# Patient Record
Sex: Male | Born: 1980
Health system: Southern US, Community
[De-identification: ages and names within clinical notes are randomized; demographics above are authoritative.]

## PROBLEM LIST (undated history)

## (undated) DIAGNOSIS — E349 Endocrine disorder, unspecified: Secondary | ICD-10-CM

## (undated) DIAGNOSIS — F32A Depression, unspecified: Secondary | ICD-10-CM

## (undated) DIAGNOSIS — R112 Nausea with vomiting, unspecified: Secondary | ICD-10-CM

## (undated) DIAGNOSIS — Z5189 Encounter for other specified aftercare: Secondary | ICD-10-CM

## (undated) DIAGNOSIS — Z87898 Personal history of other specified conditions: Secondary | ICD-10-CM

## (undated) DIAGNOSIS — G8929 Other chronic pain: Secondary | ICD-10-CM

## (undated) DIAGNOSIS — D649 Anemia, unspecified: Secondary | ICD-10-CM

## (undated) DIAGNOSIS — F329 Major depressive disorder, single episode, unspecified: Secondary | ICD-10-CM

## (undated) DIAGNOSIS — E039 Hypothyroidism, unspecified: Secondary | ICD-10-CM

## (undated) DIAGNOSIS — E785 Hyperlipidemia, unspecified: Secondary | ICD-10-CM

## (undated) DIAGNOSIS — N62 Hypertrophy of breast: Secondary | ICD-10-CM

## (undated) DIAGNOSIS — R269 Unspecified abnormalities of gait and mobility: Secondary | ICD-10-CM

## (undated) DIAGNOSIS — M549 Dorsalgia, unspecified: Secondary | ICD-10-CM

## (undated) DIAGNOSIS — H539 Unspecified visual disturbance: Secondary | ICD-10-CM

## (undated) DIAGNOSIS — F419 Anxiety disorder, unspecified: Secondary | ICD-10-CM

## (undated) DIAGNOSIS — G35 Multiple sclerosis: Secondary | ICD-10-CM

## (undated) DIAGNOSIS — I82409 Acute embolism and thrombosis of unspecified deep veins of unspecified lower extremity: Secondary | ICD-10-CM

## (undated) DIAGNOSIS — R0683 Snoring: Secondary | ICD-10-CM

## (undated) DIAGNOSIS — Z9889 Other specified postprocedural states: Secondary | ICD-10-CM

## (undated) HISTORY — DX: Dorsalgia, unspecified: M54.9

## (undated) HISTORY — DX: Hyperlipidemia, unspecified: E78.5

## (undated) HISTORY — DX: Unspecified abnormalities of gait and mobility: R26.9

## (undated) HISTORY — PX: ULNAR NERVE REPAIR: SHX2594

## (undated) HISTORY — DX: Personal history of other specified conditions: Z87.898

## (undated) HISTORY — PX: BUNIONECTOMY: SHX129

## (undated) HISTORY — DX: Acute embolism and thrombosis of unspecified deep veins of unspecified lower extremity: I82.409

## (undated) HISTORY — DX: Anemia, unspecified: D64.9

## (undated) HISTORY — DX: Depression, unspecified: F32.A

## (undated) HISTORY — PX: GYNECOMASTIA EXCISION: SHX5170

## (undated) HISTORY — DX: Encounter for other specified aftercare: Z51.89

## (undated) HISTORY — DX: Endocrine disorder, unspecified: E34.9

## (undated) HISTORY — DX: Major depressive disorder, single episode, unspecified: F32.9

## (undated) HISTORY — DX: Unspecified visual disturbance: H53.9

## (undated) HISTORY — DX: Hypothyroidism, unspecified: E03.9

## (undated) HISTORY — DX: Other chronic pain: G89.29

## (undated) HISTORY — DX: Multiple sclerosis: G35

---

## 1999-06-12 ENCOUNTER — Emergency Department (HOSPITAL_COMMUNITY): Admission: EM | Admit: 1999-06-12 | Discharge: 1999-06-13 | Payer: Self-pay | Admitting: *Deleted

## 1999-08-08 ENCOUNTER — Emergency Department (HOSPITAL_COMMUNITY): Admission: EM | Admit: 1999-08-08 | Discharge: 1999-08-08 | Payer: Self-pay

## 1999-08-08 ENCOUNTER — Emergency Department (HOSPITAL_COMMUNITY): Admission: EM | Admit: 1999-08-08 | Discharge: 1999-08-08 | Payer: Self-pay | Admitting: Emergency Medicine

## 2001-12-29 ENCOUNTER — Emergency Department (HOSPITAL_COMMUNITY): Admission: EM | Admit: 2001-12-29 | Discharge: 2001-12-29 | Payer: Self-pay | Admitting: Emergency Medicine

## 2007-03-21 ENCOUNTER — Encounter: Admission: RE | Admit: 2007-03-21 | Discharge: 2007-03-21 | Payer: Self-pay | Admitting: General Surgery

## 2013-01-02 ENCOUNTER — Other Ambulatory Visit: Payer: Self-pay | Admitting: Neurology

## 2013-02-21 ENCOUNTER — Other Ambulatory Visit: Payer: Self-pay

## 2013-02-21 MED ORDER — INTERFERON BETA-1A 44 MCG/0.5ML ~~LOC~~ SOLN: 44.0000 ug | SUBCUTANEOUS | Status: DC

## 2013-03-08 ENCOUNTER — Encounter: Payer: Self-pay | Admitting: Nurse Practitioner

## 2013-03-10 ENCOUNTER — Encounter: Payer: Self-pay | Admitting: Nurse Practitioner

## 2013-03-10 ENCOUNTER — Ambulatory Visit (INDEPENDENT_AMBULATORY_CARE_PROVIDER_SITE_OTHER): Payer: BC Managed Care – PPO | Admitting: Nurse Practitioner

## 2013-03-10 VITALS — BP 120/68 | HR 68 | Ht 65.5 in | Wt 164.0 lb

## 2013-03-10 DIAGNOSIS — R269 Unspecified abnormalities of gait and mobility: Secondary | ICD-10-CM | POA: Insufficient documentation

## 2013-03-10 DIAGNOSIS — G35 Multiple sclerosis: Secondary | ICD-10-CM | POA: Insufficient documentation

## 2013-03-10 NOTE — Patient Instructions (Addendum)
Repeat MRI of the brain and cervical spine and compared to 2011 Rx for tecfidera Stop Rebif Followup in 3 months

## 2013-03-10 NOTE — Progress Notes (Signed)
Reason for visit follow up for MS  HPI:Mr. Jeremy [name], 32 year old returns for follow up. He was last seen 12/05/11. He has  a history of multiple sclerosis with dx in 2011. . This patient has also had a positive lupus anticoagulant antibody on one occasion.  The fatigue and listlessness that he was experiencing at one time is made better with testosterome.  . Patient is on Rebif, and is tolerating the medication well but is interested in oral drugs. Patient has had blood work drawn by  Dr. Su Hilt. Patient has noted no new symptoms of numbness or weakness of the face, arms, or legs. Patient denies any balance issues or problems controlling the bowels or the bladder. e male with a history of multiple sclerosis. This patient has also had a positive lupus anticoagulant antibody on one occasion.  Since last seen, the patient was started on testosterone supplementation which has made him feel much better. The fatigue and listlessness that he was experiencing has disappeared. Patient is on Rebif, and is tolerating the medication well.  ROS: Allergies, memory loss, depression, sleepiness   Medications Current Outpatient Prescriptions on File Prior to Visit  Medication Sig Dispense Refill  . ALPRAZolam (XANAX) 1 MG tablet Take 1 mg by mouth at bedtime as needed for sleep (take 2 tabs as needed at night).      Marland Kitchen amphetamine-dextroamphetamine (ADDERALL) 20 MG tablet Take 20 mg by mouth daily.      Marland Kitchen HYDROcodone-acetaminophen (VICODIN ES) 7.5-750 MG per tablet Take 1 tablet by mouth every 6 (six) hours as needed for pain.      Marland Kitchen interferon beta-1a (REBIF) 44 MCG/0.5ML injection Inject 0.5 mLs (44 mcg total) into the skin 3 (three) times a week.  18 mL  0  . levothyroxine (SYNTHROID) 25 MCG tablet Take 25 mcg by mouth daily before breakfast.      . simvastatin (ZOCOR) 10 MG tablet Take 10 mg by mouth at bedtime.      Marland Kitchen testosterone cypionate (DEPO-TESTOSTERONE) 200 MG/ML injection Inject 200 mg into the muscle every  14 (fourteen) days.       No current facility-administered medications on file prior to visit.    Allergies No Known Allergies  Physical Exam General: well developed, well nourished, seated, in no evident distress Head: head normocephalic and atraumatic. Oropharynx benign Neck: supple with no carotid  bruits Cardiovascular: regular rate and rhythm, no murmurs  Neurologic Exam Mental Status: Awake and fully alert. Oriented to place and time. Follows all commands. Speech and language normal.   Cranial Nerves:  Pupils equal, briskly reactive to light. Extraocular movements full without nystagmus. Visual fields full to confrontation. Hearing intact and symmetric to finger snap. Facial sensation intact. Face, tongue, palate move normally and symmetrically. Neck flexion and extension normal.  Motor: Normal bulk and tone. Normal strength in all tested extremity muscles.No focal weakness Sensory.: intact to touch and pinprick and vibratory.  Coordination: Rapid alternating movements normal in all extremities. Finger-to-nose and heel-to-shin performed accurately bilaterally. Gait and Station: Arises from chair without difficulty. Stance is normal. Gait demonstrates normal stride length and balance . Able to heel, toe and tandem walk without difficulty.  Reflexes: 2+ and symmetric. Toes downgoing.     ASSESSMENT: Multiple sclerosis on Rebif since 2011, wanting to switch to oral.  History of elevated liver enzymes in the past. MRI of the brain May 2011 shows solitary left anterior medial temporal white matter hyperintensity. Repeat scan was not done MRI of the  cervical spine in May 2011 shows spinal cord hyperintensity at C4 and C6 suspicious for demyelinating plaques. The lesion at C6 shows faint enhancement.   PLAN: Repeat MRI of the brain and cervical spine and compare to 2011( patient says he has met his deductible for the year) Will send recent labs to my attention Will set up for  tecfidera Followup in 3 months Nilda Riggs, [facility] APRN

## 2013-03-10 NOTE — Progress Notes (Signed)
I have read the note, and I agree with the clinical assessment and plan.  Shannan Garfinkel KEITH   

## 2013-03-11 DIAGNOSIS — Z0289 Encounter for other administrative examinations: Secondary | ICD-10-CM

## 2013-03-12 ENCOUNTER — Telehealth: Payer: Self-pay | Admitting: Neurology

## 2013-03-12 NOTE — Telephone Encounter (Signed)
Patient was here for a visit with Jeremy [name] on 03-10-13.  He is requesting a letter stating that he needs a retractable awning for the sun because of his MS disability.  He has already paid for letter.

## 2013-03-13 DIAGNOSIS — Z0289 Encounter for other administrative examinations: Secondary | ICD-10-CM

## 2013-03-14 ENCOUNTER — Other Ambulatory Visit: Payer: Self-pay | Admitting: Neurology

## 2013-03-14 ENCOUNTER — Encounter: Payer: Self-pay | Admitting: Nurse Practitioner

## 2013-03-14 NOTE — Telephone Encounter (Signed)
Letter was completed, I spoke with patient and he asked for it to be mailed.  Letter put in mail Friday.

## 2013-03-19 ENCOUNTER — Telehealth: Payer: Self-pay | Admitting: Nurse Practitioner

## 2013-03-19 NOTE — Telephone Encounter (Signed)
By viewing chart notes, patient is being changed to Tecfidera.  I called the patient back.  He said he has decided he does not want to change meds and wants to stay on Rebif.  Says he still has a few weeks of meds left, but will need a refill soon.  Told him I will get a message to [facility] regarding drug change back to Rebif.  He verbalized understanding.

## 2013-03-19 NOTE — Telephone Encounter (Signed)
Ok to switch back. He wanted to change because he is tired of shots was my understanding.

## 2013-03-20 MED ORDER — INTERFERON BETA-1A 44 MCG/0.5ML ~~LOC~~ SOLN: 44.0000 ug | SUBCUTANEOUS | Status: DC

## 2013-03-20 NOTE — Telephone Encounter (Signed)
Rx added back to med list and sent to pharmacy.

## 2013-03-26 ENCOUNTER — Ambulatory Visit (INDEPENDENT_AMBULATORY_CARE_PROVIDER_SITE_OTHER): Payer: BC Managed Care – PPO

## 2013-03-26 DIAGNOSIS — G35 Multiple sclerosis: Secondary | ICD-10-CM

## 2013-03-28 MED ORDER — GADOPENTETATE DIMEGLUMINE 469.01 MG/ML IV SOLN: 15.0000 mL | Freq: Once | INTRAVENOUS | Status: AC | PRN

## 2013-04-03 ENCOUNTER — Telehealth: Payer: Self-pay | Admitting: Nurse Practitioner

## 2013-04-03 NOTE — Telephone Encounter (Signed)
Called patient to discuss MRI I need him to call back with a time and # that he can be reached.

## 2013-04-03 NOTE — Telephone Encounter (Signed)
Called patient. Made him aware Dr. Anne Hahn and I reviewed MRI of the brain and c spine yesterday. We recommend change in therapy to Gilenya. He wants to call his insurance for coverage first. Made aware of steps that need to be taken prior to taking med. He will call back tomorrow

## 2013-04-04 ENCOUNTER — Telehealth: Payer: Self-pay | Admitting: Nurse Practitioner

## 2013-04-04 NOTE — Telephone Encounter (Signed)
I called patient. The patient is to have blood work done through his primary care physician. I have indicated that he needs a CBC, comprehensive metabolic profile, and a herpes zoster antibody level. He will be an ophthalmologic evaluation to ensure that the macula is healthy. After this, he will be referred for the cardiac evaluation. I left a message.

## 2013-04-04 NOTE — Telephone Encounter (Signed)
Patient came in today to sign paperwork to get Gilenya started.  I went through the checklist with him about testing that needs to be done.  He said that he is seeing his PCP, Dr. Burton Apley next week so he will be able to get the blood work done, I'm not sure if we need to have the lab orders set to him.  Also need referral to eye doctor and Dr. Jacinto Halim for consult and EGG.  The patient is aware of the process and will call with further questions.

## 2013-04-09 ENCOUNTER — Other Ambulatory Visit: Payer: Self-pay | Admitting: Neurology

## 2013-04-09 ENCOUNTER — Telehealth: Payer: Self-pay | Admitting: Neurology

## 2013-04-09 DIAGNOSIS — G35 Multiple sclerosis: Secondary | ICD-10-CM

## 2013-04-09 NOTE — Telephone Encounter (Signed)
Spoke to patient and he is scheduled for his blood work on 02-11-13, EKG and consult on 05-13-13, and he made his own eye exam on 04-22-13.  If Dr. Dione Booze can get him in sooner he will see him instead.

## 2013-04-24 ENCOUNTER — Telehealth: Payer: Self-pay

## 2013-04-24 NOTE — Telephone Encounter (Signed)
[employer] mailed [place] a correspondence indicating they have denied our request for Prior Approval on Gilenya.  The patient's policy states that he must try and fail a minimum of two preferred drugs which include: Betaseron, Copaxone Rebif and Tecfidera.   He amy be changed to a formulary alternative or a letter of appeal may be written and sent to the medical review board (Fax [phone]) to try and overturn denial.  Please advise.  Thank you.

## 2013-04-24 NOTE — Telephone Encounter (Signed)
Insurance company has denied Gilenya, but they will allow Tecfidera. The patient has had progression on Rebif. If the insurance will allow Tecfidera, I am okay with this. I'll have Eber Jones contact the patient.

## 2013-04-25 ENCOUNTER — Telehealth: Payer: Self-pay | Admitting: Neurology

## 2013-04-25 NOTE — Telephone Encounter (Signed)
The patient will need to complete the enrollment form.  I called him back.  Got no answer.  Left message asking that he call back and advise wether he wants to come in and sign forms, or if he would like them mailed.

## 2013-04-25 NOTE — Telephone Encounter (Signed)
Called patient and left a message that Dr. Anne Hahn and I are ok with Tecfidera. His insurance will not cover Gilenya. He needs to call back and let us know what he wants to do

## 2013-04-28 NOTE — Telephone Encounter (Signed)
Patient called requesting forms be mailed.  Elease Hashimoto has mailed this to the patient.

## 2013-04-29 ENCOUNTER — Encounter: Payer: Self-pay | Admitting: Neurology

## 2013-05-06 ENCOUNTER — Telehealth: Payer: Self-pay | Admitting: Neurology

## 2013-05-06 NOTE — Telephone Encounter (Signed)
Patient left message to find out status of Tecfidera.  I left message that forms have been faxed and that they will contact him once they receive approval from insurance co.

## 2013-05-13 ENCOUNTER — Telehealth: Payer: Self-pay | Admitting: Neurology

## 2013-05-13 NOTE — Telephone Encounter (Signed)
2 week washout

## 2013-05-13 NOTE — Telephone Encounter (Signed)
Patient wanted to know wash out period before he starts the Tecfidera.     I also relayed that his PA had been completed and he should wait for a call from Tecfidera, about copays and shipment of drug.

## 2013-05-14 NOTE — Telephone Encounter (Signed)
Left message that there is a two wash out period before he starts Tecfidera.

## 2013-05-20 ENCOUNTER — Other Ambulatory Visit: Payer: Self-pay | Admitting: Nurse Practitioner

## 2013-05-20 MED ORDER — INTERFERON BETA-1A 44 MCG/0.5ML ~~LOC~~ SOLN: 44.0000 ug | SUBCUTANEOUS | Status: DC

## 2013-06-10 ENCOUNTER — Ambulatory Visit (INDEPENDENT_AMBULATORY_CARE_PROVIDER_SITE_OTHER): Payer: BC Managed Care – PPO | Admitting: Nurse Practitioner

## 2013-06-10 ENCOUNTER — Encounter (INDEPENDENT_AMBULATORY_CARE_PROVIDER_SITE_OTHER): Payer: Self-pay

## 2013-06-10 ENCOUNTER — Encounter: Payer: Self-pay | Admitting: Nurse Practitioner

## 2013-06-10 VITALS — Ht 66.0 in | Wt 165.0 lb

## 2013-06-10 DIAGNOSIS — R269 Unspecified abnormalities of gait and mobility: Secondary | ICD-10-CM

## 2013-06-10 DIAGNOSIS — G35 Multiple sclerosis: Secondary | ICD-10-CM

## 2013-06-10 NOTE — Patient Instructions (Signed)
Liver function testing in 3 months Continue tecfidera 240 twice daily Followup in 4-6 months

## 2013-06-10 NOTE — Progress Notes (Signed)
I have read the note, and I agree with the clinical assessment and plan.  WILLIS,CHARLES KEITH   

## 2013-06-10 NOTE — Progress Notes (Signed)
[facility]  PATIENT: Jeremy [name] DOB: 07-19-1981   REASON FOR VISIT: For multiple sclerosis   HISTORY OF PRESENT ILLNESS:[name], 32 year old white male returns for followup. He was last in the office 03/10/2013 he was on Rebif at that time but was interested in oral medications as he is tired of injecting. He had repeat MRI of the brain and cervical spine prior to initiation of new medication.Abnormal MRI brain 03/10/13 (with and without) demonstrating: There is a faint left temporal, T2 hyperintense periventricular lesion (series 9 image 17, series 4 image 11). There are 5-6 additional subcortical T2 hyperintensities on sagittal T2FLAIR views, but not clearly seen on axial views. No abnormal lesions are seen on post contrast views. Consistent with chronic demyelinating plaques. Abnormal MRI cervical spine (with and without) demonstrating:  Faint chronic demyelinating plaques at C4 on the right, C6 on the left.  No acute plaques seen. His fatigue and listlessness was better with IM  testosterone instead of the gel he is currently taking prescribed by his primary care Dr. Su Hilt. He complains of depression and anxiety and is taking Xanax which helps him sleep. He complains of occasional joint pain and aching muscles. He denies any balance issues, spasticity, problem with bowel or bladder control. Dr. Anne Hahn had wanted to place the patient on Gilenya due to his MRI however his insurance would not pay for this after he had testing. They would cover tecfidera and he has been on that for approximately 2 weeks with mild complaints of flushing. He was instructed to take the medication with a full meal twice daily   HISTORY: He has a history of multiple sclerosis with dx in 2011. . This patient has also had a positive lupus anticoagulant antibody on one occasion. The fatigue and listlessness that he was experiencing at one time is made better with testosterome. . Patient is on Rebif,  and is tolerating the medication well but is interested in oral drugs. Patient has had blood work drawn by Dr. Su Hilt. Patient has noted no new symptoms of numbness or weakness of the face, arms, or legs. Patient denies any balance issues or problems controlling the bowels or the bladder. e male with a history of multiple sclerosis. This patient has also had a positive lupus anticoagulant antibody on one occasion. Since last seen, the patient was started on testosterone supplementation which has made him feel much better. The fatigue and listlessness that he was experiencing has disappeared. Patient is on Rebif, and is tolerating the medication well.     REVIEW OF SYSTEMS: Full 14 system review of systems performed and notable only for:  Constitutional: Fatigue  Cardiovascular: N/A  Ear/Nose/Throat: N/A  Skin: N/A  Eyes: N/A  Respiratory: Snoring Gastroitestinal: N/A  Hematology/Lymphatic: N/A  Endocrine: Flushing  Musculoskeletal joint pain Allergy/Immunology: N/A  Neurological: Memory loss  Psychiatric: Depression anxiety  ALLERGIES: No Known Allergies  HOME MEDICATIONS: Outpatient Prescriptions Prior to Visit  Medication Sig Dispense Refill  . ALPRAZolam (XANAX) 1 MG tablet Take 1 mg by mouth at bedtime as needed for sleep (take 2 tabs as needed at night).      Marland Kitchen amphetamine-dextroamphetamine (ADDERALL) 20 MG tablet Take 20 mg by mouth daily.      Marland Kitchen HYDROcodone-acetaminophen (VICODIN ES) 7.5-750 MG per tablet Take 1 tablet by mouth every 6 (six) hours as needed for pain.      Marland Kitchen levothyroxine (SYNTHROID) 25 MCG tablet Take 25 mcg by mouth daily before breakfast.      .  simvastatin (ZOCOR) 10 MG tablet Take 10 mg by mouth at bedtime.      . interferon beta-1a (REBIF) 44 MCG/0.5ML injection Inject 0.5 mLs (44 mcg total) into the skin 3 (three) times a week.  18 mL  1  . testosterone cypionate (DEPO-TESTOSTERONE) 200 MG/ML injection Inject 200 mg into the muscle every 14 (fourteen)  days.       No facility-administered medications prior to visit.    PAST MEDICAL HISTORY: Past Medical History  Diagnosis Date  . Hx of dizziness   . Gait disturbance   . Hypothyroidism   . Depression   . Dyslipidemia   . Back pain, chronic     low  . MS (multiple sclerosis)     probable  . Testosterone deficiency     PAST SURGICAL HISTORY: Past Surgical History  Procedure Laterality Date  . None listed      FAMILY HISTORY: Family History  Problem Relation Age of Onset  . Cancer Mother   . Cancer Father   . Stroke      Grandmother  . Hypertension Brother     SOCIAL HISTORY: History   Social History  . Marital Status: Single    Spouse Name: N/A    Number of Children: 0  . Years of Education: College    Occupational History  . Chef     Social History Main Topics  . Smoking status: Never Smoker   . Smokeless tobacco: Never Used  . Alcohol Use: No  . Drug Use: No  . Sexual Activity: Not on file   Other Topics Concern  . 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    Patient has a 4 year college education.    Patient has no children.            PHYSICAL EXAM  Filed Vitals:   06/10/13 1004  Height: 5\' 6"  (1.676 m)  Weight: 165 lb (74.844 kg)   Body mass index is 26.64 kg/(m^2).  Generalized: Well developed, in no acute distress  Head: normocephalic and atraumatic,. Oropharynx benign  Neck: Supple, no carotid bruits  Cardiac: Regular rate rhythm, no murmur  Musculoskeletal: No deformity   Neurological examination   Mentation: Alert oriented to time, place, history taking. Follows all commands speech and language fluent  Cranial nerve II-XII: Visual acuity 20/70 bilateral with glasses .Pupils were equal round reactive to light extraocular movements were full, visual field were full on confrontational test. Facial sensation and strength were normal. hearing was intact to finger rubbing bilaterally. Uvula  tongue midline. head turning and shoulder shrug and were normal and symmetric.Tongue protrusion into cheek strength was normal. Motor: normal bulk and tone, full strength in the BUE, BLE, fine finger movements normal, no pronator drift. No focal weakness Sensory: normal and symmetric to light touch, pinprick, and  vibration  Coordination: finger-nose-finger, heel-to-shin bilaterally, no dysmetria Reflexes: Brachioradialis 2/2, biceps 2/2, triceps 2/2, patellar 2/2, Achilles 2/2, plantar responses were flexor bilaterally. Gait and Station: Rising up from seated position without assistance, normal stance,  moderate stride, good arm swing, smooth turning, able to perform tiptoe, and heel walking without difficulty. Tandem gait steady  DIAGNOSTIC DATA (LABS, IMAGING, TESTING) - I reviewed patient records, labs, notes, testing and imaging myself where available.  Abnormal MRI brain 03/10/13 (with and without) demonstrating: There is a faint left temporal, T2 hyperintense periventricular lesion (series 9 image 17, series 4 image 11). There  are 5-6 additional subcortical T2 hyperintensities on sagittal T2FLAIR views, but not clearly seen on axial views. No abnormal lesions are seen on post contrast views. Consistent with chronic demyelinating plaques. Abnormal MRI cervical spine (with and without) demonstrating:  Faint chronic demyelinating plaques at C4 on the right, C6 on the left.  No acute plaques seen. ASSESSMENT AND PLAN  32 y.o. year old male  has a past medical history of dizziness; Gait disturbance; Hypothyroidism; Depression; Dyslipidemia; Back pain, chronic; MS (multiple sclerosis); and Testosterone deficiency. here to followup. We attempted to get the patient on gilenya  after his last visit,  his insurance company would not cover this. He is now on tecfidera and has been on that medication for about 2 weeks with minimal flushing  Liver function testing in 3 months, he will get this done at Dr.  Su Hilt office Continue tecfidera 240 twice daily Followup in 4-6 months Nilda Riggs, [facility], APRN  [facility] [address], [address] [place], [place] [address] [phone]

## 2013-07-07 ENCOUNTER — Telehealth: Payer: Self-pay

## 2013-07-07 NOTE — Telephone Encounter (Signed)
[employer] [place] sent [place] a letter saying they have approved our request for coverage on Tecfidera Effective from 06/03/2013 through 07/23/2038 Ref # [id number]

## 2013-07-31 ENCOUNTER — Other Ambulatory Visit: Payer: Self-pay

## 2013-07-31 ENCOUNTER — Telehealth: Payer: Self-pay | Admitting: Neurology

## 2013-07-31 MED ORDER — DIMETHYL FUMARATE 240 MG PO CPDR: 240.0000 mg | DELAYED_RELEASE_CAPSULE | Freq: Two times a day (BID) | ORAL | Status: DC

## 2013-07-31 NOTE — Telephone Encounter (Signed)
Patient called requesting a refill on Tecfidera.  He would like it sent to [facility].

## 2013-07-31 NOTE — Telephone Encounter (Signed)
Rx has already been sent.  Duplicate message.

## 2013-08-18 ENCOUNTER — Telehealth: Payer: Self-pay | Admitting: Nurse Practitioner

## 2013-08-18 NOTE — Telephone Encounter (Signed)
The prior auth was already approved effective until 2039 (see phone note from 12/15).  I called back.  Spoke with Rose.  She will not the patients file.

## 2013-08-18 NOTE — Telephone Encounter (Signed)
NEEDS PRE-AUTHORIZATION FOR TECFIDERA--[employer] [place] NEEDS ALL CLINICALS FAXED TO [phone]--REF#[id number] IF CALLING BACK [employer].

## 2013-08-19 ENCOUNTER — Telehealth: Payer: Self-pay | Admitting: Neurology

## 2013-08-19 NOTE — Telephone Encounter (Signed)
Jeremy [name] w/[employer] called to get a prior authorization for Tysabri for PT.  She stated that the case was opened yesterday (08-18-13) and they have 7 days time limit.  Selena Batten stated that she will be faxing the paperwork to [phone] and when received we should call and confirm whether we would like to keep the case open or if they should close it until we get the paperwork completed and physician's approval.  The case # is [id number].  Please call with any questions.

## 2013-08-19 NOTE — Telephone Encounter (Signed)
Last OV says the patient is on Tecfidera, not Tysabri.  We already have a PA approval on file for Tecfidera effective until 2039.  (See note from 12/15).  I called Kim back.  Got no answer.  [place].  Asked her to call me back if anything further is needed.  I think maybe the Tysabri is a mistake, as I am unable to locate anything in the chart notes regarding this drug.

## 2013-12-09 ENCOUNTER — Ambulatory Visit (INDEPENDENT_AMBULATORY_CARE_PROVIDER_SITE_OTHER): Payer: BC Managed Care – PPO | Admitting: Nurse Practitioner

## 2013-12-09 ENCOUNTER — Encounter: Payer: Self-pay | Admitting: Nurse Practitioner

## 2013-12-09 VITALS — BP 101/57 | HR 75 | Ht 66.0 in | Wt 163.0 lb

## 2013-12-09 DIAGNOSIS — G35 Multiple sclerosis: Secondary | ICD-10-CM

## 2013-12-09 NOTE — Patient Instructions (Signed)
Continue tecfidera at current dose, less side effects with a full meal Please send copy of recent labs to our fax number, given the patient Followup in 6 months at that time repeat MRI of the brain Healthy diet with fruits  and vegetables, fish and chicken Moderate exercise for overall general health Followup in 6 months

## 2013-12-09 NOTE — Progress Notes (Signed)
[facility]  PATIENT: Jeremy [name] DOB: 1980/12/05   REASON FOR VISIT: Followup for multiple sclerosis   HISTORY OF PRESENT ILLNESS:Jeremy [name], 33 year old male returns for followup. He was on Rebif at one time but is now on tecfidera with minimal side effects. His insurance would not cover gilenya. He had repeat MRI of the brain and cervical spine prior to initiation of new medication.Abnormal MRI brain 03/10/13 (with and without) demonstrating: There is a faint left temporal, T2 hyperintense periventricular lesion (series 9 image 17, series 4 image 11). There are 5-6 additional subcortical T2 hyperintensities on sagittal T2FLAIR views, but not clearly seen on axial views. No abnormal lesions are seen on post contrast views. Consistent with chronic demyelinating plaques. Abnormal MRI cervical spine (with and without) demonstrating: Faint chronic demyelinating plaques at C4 on the right, C6 on the left. No acute plaques seen. His fatigue and listlessness was better with IM testosterone instead of the gel he is currently taking prescribed by his primary care Dr. Su [name]. He complains of depression and anxiety and is taking Xanax which helps him sleep. He complains of occasional joint pain and aching muscles. He denies any balance issues, spasticity, problem with bowel or bladder control. He returns for reevaluation   HISTORY: He has a history of multiple sclerosis with dx in 2011.  This patient has also had a positive lupus anticoagulant antibody on one occasion. The fatigue and listlessness that he was experiencing at one time is made better with testosterome. . Patient is on Rebif, and is tolerating the medication well but is interested in oral drugs. Patient has had blood work drawn by Dr. Su [name]. Patient has noted no new symptoms of numbness or weakness of the face, arms, or legs. Patient denies any balance issues or problems controlling the bowels or the bladder. e male with a  history of multiple sclerosis. This patient has also had a positive lupus anticoagulant antibody on one occasion. Since last seen, the patient was started on testosterone supplementation which has made him feel much better. The fatigue and listlessness that he was experiencing has disappeared. Patient is on Rebif, and is tolerating the medication well.      REVIEW OF SYSTEMS: Full 14 system review of systems performed and notable only for those listed, all others are neg:  Constitutional: Fatigue  Cardiovascular: N/A  Ear/Nose/Throat: N/A  Skin: N/A  Eyes: N/A  Respiratory: N/A  Gastroitestinal: N/A  Hematology/Lymphatic: N/A  Endocrine: N/A Musculoskeletal:N/A  Allergy/Immunology: N/A  Neurological: N/A Psychiatric: Depression anxiety Sleep : Snoring, insomnia  ALLERGIES: No Known Allergies  HOME MEDICATIONS: Outpatient Prescriptions Prior to Visit  Medication Sig Dispense Refill  . ALPRAZolam (XANAX) 1 MG tablet Take 1 mg by mouth at bedtime as needed for sleep (take 2 tabs as needed at night).      Marland Kitchen amphetamine-dextroamphetamine (ADDERALL) 20 MG tablet Take 20 mg by mouth daily.      . Dimethyl Fumarate (TECFIDERA) 240 MG CPDR Take 1 capsule (240 mg total) by mouth 2 (two) times daily.  60 capsule  5  . HYDROcodone-acetaminophen (VICODIN ES) 7.5-750 MG per tablet Take 1 tablet by mouth every 6 (six) hours as needed for pain.      Marland Kitchen levothyroxine (SYNTHROID) 25 MCG tablet Take 25 mcg by mouth daily before breakfast.      . simvastatin (ZOCOR) 10 MG tablet Take 10 mg by mouth at bedtime.      Marland Kitchen testosterone (TESTIM) 50 MG/5GM GEL Place 5 g  onto the skin daily. 100 mg daily       No facility-administered medications prior to visit.    PAST MEDICAL HISTORY: Past Medical History  Diagnosis Date  . Hx of dizziness   . Gait disturbance   . Hypothyroidism   . Depression   . Dyslipidemia   . Back pain, chronic     low  . MS (multiple sclerosis)     probable  .  Testosterone deficiency     PAST SURGICAL HISTORY: Past Surgical History  Procedure Laterality Date  . None listed      FAMILY HISTORY: Family History  Problem Relation Age of Onset  . Cancer Mother   . Cancer Father   . Stroke      Grandmother  . Hypertension Brother     SOCIAL HISTORY: History   Social History  . Marital Status: Single    Spouse Name: N/A    Number of Children: 0  . Years of Education: College    Occupational History  . Chef     Social History Main Topics  . Smoking status: Never Smoker   . Smokeless tobacco: Never Used  . Alcohol Use: No  . Drug Use: No  . Sexual Activity: Not on file   Other Topics Concern  . Not on file   Social History Narrative   Patient is single and works as a Geologist, engineeringDJ.    Patient has a 4 year college education.    Patient has no children.            PHYSICAL EXAM  Filed Vitals:   12/09/13 1053  BP: 101/57  Pulse: 75  Height: 5\' 6"  (1.676 m)  Weight: 163 lb (73.936 kg)   Body mass index is 26.32 kg/(m^2).  Generalized: Well developed, in no acute distress  Head: normocephalic and atraumatic,. Oropharynx benign  Neck: Supple, no carotid bruits  Cardiac: Regular rate rhythm, no murmur  Musculoskeletal: No deformity   Neurological examination   Mentation: Alert oriented to time, place, history taking. Follows all commands speech and language fluent  Cranial nerve II-XII: Visual acuity 20/20 right, 20/30 left . Pupils were equal round reactive to light extraocular movements were full, visual field were full on confrontational test. Facial sensation and strength were normal. hearing was intact to finger rubbing bilaterally. Uvula tongue midline. head turning and shoulder shrug were normal and symmetric.Tongue protrusion into cheek strength was normal. Motor: normal bulk and tone, full strength in the BUE, BLE, fine finger movements normal, no pronator drift. No focal weakness Sensory: normal and symmetric to  light touch, pinprick, and  vibration  Coordination: finger-nose-finger, heel-to-shin bilaterally, no dysmetria Reflexes: Brachioradialis 2/2, biceps 2/2, triceps 2/2, patellar 2/2, Achilles 2/2, plantar responses were flexor bilaterally. Gait and Station: Rising up from seated position without assistance, normal stance,  moderate stride, good arm swing, smooth turning, able to perform tiptoe, and heel walking without difficulty. Tandem gait is steady  DIAGNOSTIC DATA (LABS, IMAGING, TESTING) -  ASSESSMENT AND PLAN  33 y.o. year old male  has a past medical history of dizziness; Gait disturbance; Hypothyroidism; Depression; Dyslipidemia; Back pain, chronic; MS (multiple sclerosis); and Testosterone deficiency. here to followup for his multiple sclerosis. He is currently on tecfidera with minimal side effects of flushing. He does not always take it with a full meal  Continue tecfidera at current dose, less side effects with a full meal Please send copy of recent labs to our fax number, given the patient Followup  in 6 months at that time repeat MRI of the brain Healthy diet with fruits  and vegetables, fish and chicken, given document on healthy eating  Moderate exercise for overall general health Followup in 6 months Nilda Riggs, [facility], [facility], APRN  [facility] [address], [address] [place], [place] [address] [phone]

## 2013-12-09 NOTE — Progress Notes (Signed)
I have read the note, and I agree with the clinical assessment and plan.  Gerard Cantara K Takyla Kuchera   

## 2014-01-19 ENCOUNTER — Encounter (INDEPENDENT_AMBULATORY_CARE_PROVIDER_SITE_OTHER): Payer: Self-pay | Admitting: General Surgery

## 2014-01-19 ENCOUNTER — Ambulatory Visit (INDEPENDENT_AMBULATORY_CARE_PROVIDER_SITE_OTHER): Payer: BC Managed Care – PPO | Admitting: General Surgery

## 2014-01-19 VITALS — BP 124/82 | HR 75 | Temp 97.5°F | Ht 66.0 in | Wt 158.0 lb

## 2014-01-19 DIAGNOSIS — N62 Hypertrophy of breast: Secondary | ICD-10-CM | POA: Insufficient documentation

## 2014-01-19 NOTE — Progress Notes (Signed)
Patient ID: Jeremy [name], male   DOB: 1980-10-16, 33 y.o.   MRN: [id number]  Chief Complaint  Patient presents with  . eval breast    HPI Jeremy [name] is a 33 y.o. male.  We're asked to see the patient in consultation by Dr. Burton Apley to evaluate him for bilateral gynecomastia. The patient is a 33 year old white male who we saw about 6 years ago with painful gynecomastia. Since that time he has undergone 2 liposuction procedures to try to remove the painful breast tissue. Unfortunately these operations have been unsuccessful. He continues to complain of painful swelling beneath the nipple and the areola complex on both breasts which is worse on the left. He denies any discharge from his nipple on either side. HPI  Past Medical History  Diagnosis Date  . Hx of dizziness   . Gait disturbance   . Hypothyroidism   . Depression   . Dyslipidemia   . Back pain, chronic     low  . MS (multiple sclerosis)     probable  . Testosterone deficiency   . Blood transfusion without reported diagnosis     Past Surgical History  Procedure Laterality Date  . None listed      Family History  Problem Relation Age of Onset  . Cancer Mother   . Cancer Father   . Stroke      Grandmother  . Hypertension Brother     Social History History  Substance Use Topics  . Smoking status: Never Smoker   . Smokeless tobacco: Never Used  . Alcohol Use: No    No Known Allergies  Current Outpatient Prescriptions  Medication Sig Dispense Refill  . ALPRAZolam (XANAX) 1 MG tablet Take 1 mg by mouth at bedtime as needed for sleep (take 2 tabs as needed at night).      Marland Kitchen amphetamine-dextroamphetamine (ADDERALL) 20 MG tablet Take 20 mg by mouth daily.      . Dimethyl Fumarate (TECFIDERA) 240 MG CPDR Take 1 capsule (240 mg total) by mouth 2 (two) times daily.  60 capsule  5  . fluticasone (FLONASE) 50 MCG/ACT nasal spray as needed.      Marland Kitchen HYDROcodone-acetaminophen (VICODIN ES) 7.5-750 MG per tablet Take 1  tablet by mouth every 6 (six) hours as needed for pain.      Marland Kitchen ibuprofen (ADVIL,MOTRIN) 800 MG tablet       . ipratropium (ATROVENT) 0.03 % nasal spray       . levothyroxine (SYNTHROID) 25 MCG tablet Take 25 mcg by mouth daily before breakfast.      . QNASL 80 MCG/ACT AERS       . simvastatin (ZOCOR) 10 MG tablet Take 10 mg by mouth at bedtime.       No current facility-administered medications for this visit.    Review of Systems Review of Systems  Constitutional: Negative.   HENT: Negative.   Eyes: Negative.   Respiratory: Negative.   Cardiovascular: Negative.   Gastrointestinal: Negative.   Endocrine: Negative.   Genitourinary: Negative.   Musculoskeletal: Negative.   Skin: Negative.   Allergic/Immunologic: Negative.   Neurological: Negative.   Hematological: Negative.   Psychiatric/Behavioral: Negative.     Blood pressure 124/82, pulse 75, temperature 97.5 F (36.4 C), height 5\' 6"  (1.676 m), weight 158 lb (71.668 kg).  Physical Exam Physical Exam  Constitutional: He is oriented to person, place, and time. He appears well-developed and well-nourished.  HENT:  Head: Normocephalic and atraumatic.  Eyes: Conjunctivae and  EOM are normal. Pupils are equal, round, and reactive to light.  Neck: Normal range of motion. Neck supple.  Cardiovascular: Normal rate, regular rhythm and normal heart sounds.   Pulmonary/Chest: Effort normal and breath sounds normal.  In the right breast there is a painful mass approximately 4 cm in diameter beneath the nipple and areola. In the left breast there is a painful mass approximately 6 cm in diameter beneath the nipple and areola. There is no palpable axillary, supraclavicular, or cervical lymphadenopathy  Abdominal: Soft. Bowel sounds are normal.  Musculoskeletal: Normal range of motion.  Neurological: He is alert and oriented to person, place, and time.  Skin: Skin is warm and dry.  Psychiatric: He has a normal mood and affect. His  behavior is normal.    Data Reviewed As above  Assessment    The patient has bilateral painful gynecomastia which is worse on the left. Because of his discomfort and the fact that these areas have recurred I think it would be reasonable to excise them completely. I discussed with him in detail the risks and benefits of the operation to do this as well as some of the technical aspects and he understands and wishes to proceed     Plan    Plan for bilateral excision of gynecomastia        TOTH III,PAUL S 01/19/2014, 3:55 PM

## 2014-01-22 ENCOUNTER — Other Ambulatory Visit: Payer: Self-pay | Admitting: Neurology

## 2014-02-23 ENCOUNTER — Encounter (HOSPITAL_COMMUNITY): Payer: Self-pay | Admitting: Pharmacy Technician

## 2014-02-28 NOTE — Pre-Procedure Instructions (Signed)
Jeremy [name]  02/28/2014   Your procedure is scheduled on:  August 17  Report to [facility] Admitting at 05:30 AM.  Call this number if you have problems the morning of surgery: [phone]   Remember:   Do not eat food or drink liquids after midnight.   Take these medicines the morning of surgery with A SIP OF WATER: Adderall, Tecfidera (only if you can take without food), Hydrocodone (if needed), Levothyroxine,    STOP Multiple Vitamins, Ibuprofen today   Do not wear jewelry, make-up or nail polish.  Do not wear lotions, powders, or perfumes. You may wear deodorant.  Do not shave 48 hours prior to surgery. Men may shave face and neck.  Do not bring valuables to the hospital.  [facility] is not responsible for any belongings or valuables.               Contacts, dentures or bridgework may not be worn into surgery.  Leave suitcase in the car. After surgery it may be brought to your room.  For patients admitted to the hospital, discharge time is determined by your treatment team.               Patients discharged the day of surgery will not be allowed to drive home.  Name and phone number of your driver: Family/ Friend  Special Instructions: See [facility] Preparing For Surgery   Please read over the following fact sheets that you were given: Pain Booklet, Coughing and Deep Breathing and Surgical Site Infection Prevention

## 2014-03-02 ENCOUNTER — Encounter (HOSPITAL_COMMUNITY): Payer: Self-pay

## 2014-03-02 ENCOUNTER — Encounter (HOSPITAL_COMMUNITY)
Admission: RE | Admit: 2014-03-02 | Discharge: 2014-03-02 | Disposition: A | Discharge status: Home / Self Care | Payer: BC Managed Care – PPO | Source: Ambulatory Visit | Attending: General Surgery | Admitting: General Surgery

## 2014-03-02 DIAGNOSIS — Z01818 Encounter for other preprocedural examination: Secondary | ICD-10-CM | POA: Insufficient documentation

## 2014-03-02 DIAGNOSIS — Z01812 Encounter for preprocedural laboratory examination: Secondary | ICD-10-CM | POA: Insufficient documentation

## 2014-03-02 HISTORY — DX: Hypertrophy of breast: N62

## 2014-03-02 HISTORY — DX: Nausea with vomiting, unspecified: Z98.890

## 2014-03-02 HISTORY — DX: Other specified postprocedural states: R11.2

## 2014-03-02 HISTORY — DX: Snoring: R06.83

## 2014-03-02 HISTORY — DX: Anxiety disorder, unspecified: F41.9

## 2014-03-02 LAB — CBC
HCT: 43.2 % (ref 39.0–52.0)
Hemoglobin: 14.9 g/dL (ref 13.0–17.0)
MCH: 31.8 pg (ref 26.0–34.0)
MCHC: 34.5 g/dL (ref 30.0–36.0)
MCV: 92.3 fL (ref 78.0–100.0)
PLATELETS: 169 10*3/uL (ref 150–400)
RBC: 4.68 MIL/uL (ref 4.22–5.81)
RDW: 12.9 % (ref 11.5–15.5)
WBC: 5.4 10*3/uL (ref 4.0–10.5)

## 2014-03-02 LAB — BASIC METABOLIC PANEL
Anion gap: 12 (ref 5–15)
BUN: 15 mg/dL (ref 6–23)
CALCIUM: 9.6 mg/dL (ref 8.4–10.5)
CO2: 26 mEq/L (ref 19–32)
Chloride: 104 mEq/L (ref 96–112)
Creatinine, Ser: 0.86 mg/dL (ref 0.50–1.35)
GLUCOSE: 82 mg/dL (ref 70–99)
Potassium: 4.9 mEq/L (ref 3.7–5.3)
SODIUM: 142 meq/L (ref 137–147)

## 2014-03-02 NOTE — Pre-Procedure Instructions (Signed)
Jeremy [name]  03/02/2014   Your procedure is scheduled on:  Monday March 09, 2014 at 7:30 AM.  Report to [facility] Admitting at 5:30 AM.  Call this number if you have problems the morning of surgery: [phone]   Remember:   Do not eat food or drink liquids after midnight.   Take these medicines the morning of surgery with A SIP OF WATER: Tecfidera (only if you can take without food), Hydrocodone (if needed),    STOP Multiple Vitamins, Ibuprofen today   Do not wear jewelry.  Do not wear lotions, powders, or cologne.  Men may shave face and neck.  Do not bring valuables to the hospital.  [facility] is not responsible for any belongings or valuables.               Contacts, dentures or bridgework may not be worn into surgery.  Leave suitcase in the car. After surgery it may be brought to your room.  For patients admitted to the hospital, discharge time is determined by your treatment team.               Patients discharged the day of surgery will not be allowed to drive home.  Name and phone number of your driver: Family/ Friend  Special Instructions: Shower the night before and the morning of your surgery using CHG soap   Please read over the following fact sheets that you were given: Pain Booklet, Coughing and Deep Breathing and Surgical Site Infection Prevention

## 2014-03-02 NOTE — Progress Notes (Signed)
PCP is Burton Apleyonald Roberts. Patient denied having a stress test, or cardiac cath, but informed Nurse that he had a sleep study last year and he has mild sleep apnea. Patient stated "I use nasal spray now so it is ok."

## 2014-03-03 ENCOUNTER — Encounter (HOSPITAL_COMMUNITY): Payer: Self-pay

## 2014-03-08 MED ORDER — CEFAZOLIN SODIUM-DEXTROSE 2-3 GM-% IV SOLR
2.0000 g | INTRAVENOUS | Status: AC
  Administered 2014-03-09: 2 g via INTRAVENOUS
  Filled 2014-03-08: qty 50

## 2014-03-09 ENCOUNTER — Ambulatory Visit (HOSPITAL_COMMUNITY)
Admission: RE | Admit: 2014-03-09 | Discharge: 2014-03-09 | Disposition: A | Discharge status: Home / Self Care | Payer: BC Managed Care – PPO | Source: Ambulatory Visit | Attending: General Surgery | Admitting: General Surgery

## 2014-03-09 ENCOUNTER — Encounter (HOSPITAL_COMMUNITY): Payer: Self-pay | Admitting: *Deleted

## 2014-03-09 ENCOUNTER — Ambulatory Visit (HOSPITAL_COMMUNITY): Payer: BC Managed Care – PPO | Admitting: Certified Registered"

## 2014-03-09 ENCOUNTER — Telehealth (INDEPENDENT_AMBULATORY_CARE_PROVIDER_SITE_OTHER): Payer: Self-pay

## 2014-03-09 ENCOUNTER — Encounter (HOSPITAL_COMMUNITY): Payer: BC Managed Care – PPO | Admitting: Certified Registered"

## 2014-03-09 ENCOUNTER — Encounter (HOSPITAL_COMMUNITY)
Admission: RE | Disposition: A | Discharge status: Home / Self Care | Payer: Self-pay | Source: Ambulatory Visit | Attending: General Surgery

## 2014-03-09 DIAGNOSIS — M549 Dorsalgia, unspecified: Secondary | ICD-10-CM | POA: Insufficient documentation

## 2014-03-09 DIAGNOSIS — F329 Major depressive disorder, single episode, unspecified: Secondary | ICD-10-CM | POA: Diagnosis not present

## 2014-03-09 DIAGNOSIS — N62 Hypertrophy of breast: Secondary | ICD-10-CM

## 2014-03-09 DIAGNOSIS — G35 Multiple sclerosis: Secondary | ICD-10-CM | POA: Diagnosis not present

## 2014-03-09 DIAGNOSIS — E785 Hyperlipidemia, unspecified: Secondary | ICD-10-CM | POA: Insufficient documentation

## 2014-03-09 DIAGNOSIS — F3289 Other specified depressive episodes: Secondary | ICD-10-CM | POA: Diagnosis not present

## 2014-03-09 DIAGNOSIS — G8929 Other chronic pain: Secondary | ICD-10-CM | POA: Diagnosis not present

## 2014-03-09 DIAGNOSIS — E039 Hypothyroidism, unspecified: Secondary | ICD-10-CM | POA: Insufficient documentation

## 2014-03-09 HISTORY — PX: GYNECOMASTIA MASTECTOMY: SHX5265

## 2014-03-09 SURGERY — MASTECTOMY, FOR GYNECOMASTIA
Anesthesia: General | Site: Breast | Laterality: Bilateral

## 2014-03-09 MED ORDER — ROCURONIUM BROMIDE 100 MG/10ML IV SOLN
INTRAVENOUS | Status: DC | PRN
  Administered 2014-03-09: 30 mg via INTRAVENOUS

## 2014-03-09 MED ORDER — CHLORHEXIDINE GLUCONATE 4 % EX LIQD
1.0000 "application " | Freq: Once | CUTANEOUS | Status: DC
  Filled 2014-03-09: qty 15

## 2014-03-09 MED ORDER — FENTANYL CITRATE 0.05 MG/ML IJ SOLN
25.0000 ug | INTRAMUSCULAR | Status: DC | PRN
  Administered 2014-03-09 (×2): 50 ug via INTRAVENOUS

## 2014-03-09 MED ORDER — MIDAZOLAM HCL 5 MG/5ML IJ SOLN
INTRAMUSCULAR | Status: DC | PRN
  Administered 2014-03-09: 2 mg via INTRAVENOUS

## 2014-03-09 MED ORDER — GLYCOPYRROLATE 0.2 MG/ML IJ SOLN
INTRAMUSCULAR | Status: AC
  Filled 2014-03-09: qty 3

## 2014-03-09 MED ORDER — OXYCODONE-ACETAMINOPHEN 5-325 MG PO TABS: 1.0000 | ORAL_TABLET | ORAL | Status: DC | PRN

## 2014-03-09 MED ORDER — GLYCOPYRROLATE 0.2 MG/ML IJ SOLN
INTRAMUSCULAR | Status: DC | PRN
  Administered 2014-03-09: .6 mg via INTRAVENOUS

## 2014-03-09 MED ORDER — DIPHENHYDRAMINE HCL 50 MG/ML IJ SOLN
INTRAMUSCULAR | Status: DC | PRN
  Administered 2014-03-09: 6.25 mg via INTRAVENOUS

## 2014-03-09 MED ORDER — MORPHINE SULFATE 4 MG/ML IJ SOLN
4.0000 mg | INTRAMUSCULAR | Status: DC | PRN
  Administered 2014-03-09: 4 mg via INTRAVENOUS

## 2014-03-09 MED ORDER — FENTANYL CITRATE 0.05 MG/ML IJ SOLN
INTRAMUSCULAR | Status: AC
  Filled 2014-03-09: qty 5

## 2014-03-09 MED ORDER — ONDANSETRON HCL 4 MG/2ML IJ SOLN
INTRAMUSCULAR | Status: DC | PRN
  Administered 2014-03-09: 4 mg via INTRAVENOUS

## 2014-03-09 MED ORDER — PROMETHAZINE HCL 25 MG/ML IJ SOLN: 6.2500 mg | INTRAMUSCULAR | Status: DC | PRN

## 2014-03-09 MED ORDER — FENTANYL CITRATE 0.05 MG/ML IJ SOLN
INTRAMUSCULAR | Status: DC | PRN
  Administered 2014-03-09: 100 ug via INTRAVENOUS
  Administered 2014-03-09: 50 ug via INTRAVENOUS

## 2014-03-09 MED ORDER — SODIUM CHLORIDE 0.9 % IJ SOLN
INTRAMUSCULAR | Status: AC
  Filled 2014-03-09: qty 10

## 2014-03-09 MED ORDER — EPHEDRINE SULFATE 50 MG/ML IJ SOLN
INTRAMUSCULAR | Status: DC | PRN
  Administered 2014-03-09: 10 mg via INTRAVENOUS

## 2014-03-09 MED ORDER — DEXAMETHASONE SODIUM PHOSPHATE 4 MG/ML IJ SOLN
INTRAMUSCULAR | Status: AC
  Filled 2014-03-09: qty 2

## 2014-03-09 MED ORDER — 0.9 % SODIUM CHLORIDE (POUR BTL) OPTIME
TOPICAL | Status: DC | PRN
  Administered 2014-03-09: 1000 mL

## 2014-03-09 MED ORDER — PROPOFOL 10 MG/ML IV BOLUS
INTRAVENOUS | Status: AC
  Filled 2014-03-09: qty 20

## 2014-03-09 MED ORDER — LIDOCAINE HCL (CARDIAC) 20 MG/ML IV SOLN
INTRAVENOUS | Status: DC | PRN
  Administered 2014-03-09: 50 mg via INTRAVENOUS

## 2014-03-09 MED ORDER — OXYCODONE-ACETAMINOPHEN 5-325 MG PO TABS
1.0000 | ORAL_TABLET | ORAL | Status: DC | PRN
  Administered 2014-03-09: 2 via ORAL

## 2014-03-09 MED ORDER — NEOSTIGMINE METHYLSULFATE 10 MG/10ML IV SOLN
INTRAVENOUS | Status: DC | PRN
  Administered 2014-03-09: 3 mg via INTRAVENOUS

## 2014-03-09 MED ORDER — PROPOFOL 10 MG/ML IV BOLUS
INTRAVENOUS | Status: DC | PRN
  Administered 2014-03-09: 180 mg via INTRAVENOUS

## 2014-03-09 MED ORDER — EPHEDRINE SULFATE 50 MG/ML IJ SOLN
INTRAMUSCULAR | Status: AC
  Filled 2014-03-09: qty 1

## 2014-03-09 MED ORDER — DEXAMETHASONE SODIUM PHOSPHATE 10 MG/ML IJ SOLN
INTRAMUSCULAR | Status: DC | PRN
  Administered 2014-03-09: 8 mg via INTRAVENOUS

## 2014-03-09 MED ORDER — MEPERIDINE HCL 25 MG/ML IJ SOLN: 6.2500 mg | INTRAMUSCULAR | Status: DC | PRN

## 2014-03-09 MED ORDER — DEXMEDETOMIDINE HCL IN NACL 200 MCG/50ML IV SOLN
INTRAVENOUS | Status: DC | PRN
  Administered 2014-03-09: .1 ug/h via INTRAVENOUS

## 2014-03-09 MED ORDER — OXYCODONE-ACETAMINOPHEN 5-325 MG PO TABS
ORAL_TABLET | ORAL | Status: AC
  Administered 2014-03-09: 2 via ORAL
  Filled 2014-03-09: qty 2

## 2014-03-09 MED ORDER — ARTIFICIAL TEARS OP OINT
TOPICAL_OINTMENT | OPHTHALMIC | Status: DC | PRN
  Administered 2014-03-09: 1 via OPHTHALMIC

## 2014-03-09 MED ORDER — PROMETHAZINE HCL 25 MG/ML IJ SOLN
INTRAMUSCULAR | Status: AC
  Filled 2014-03-09: qty 1

## 2014-03-09 MED ORDER — MIDAZOLAM HCL 2 MG/2ML IJ SOLN
INTRAMUSCULAR | Status: AC
  Filled 2014-03-09: qty 2

## 2014-03-09 MED ORDER — FENTANYL CITRATE 0.05 MG/ML IJ SOLN
INTRAMUSCULAR | Status: AC
  Administered 2014-03-09: 50 ug via INTRAVENOUS
  Filled 2014-03-09: qty 2

## 2014-03-09 MED ORDER — LACTATED RINGERS IV SOLN
INTRAVENOUS | Status: DC | PRN
  Administered 2014-03-09 (×2): via INTRAVENOUS

## 2014-03-09 MED ORDER — NEOSTIGMINE METHYLSULFATE 10 MG/10ML IV SOLN
INTRAVENOUS | Status: AC
  Filled 2014-03-09: qty 1

## 2014-03-09 MED ORDER — MORPHINE SULFATE 4 MG/ML IJ SOLN
INTRAMUSCULAR | Status: DC
Start: 2014-03-09 — End: 2014-03-09
  Filled 2014-03-09: qty 1

## 2014-03-09 MED ORDER — ACETAMINOPHEN 10 MG/ML IV SOLN
INTRAVENOUS | Status: AC
  Administered 2014-03-09: 1000 mg via INTRAVENOUS
  Filled 2014-03-09: qty 100

## 2014-03-09 SURGICAL SUPPLY — 43 items
ADH SKN CLS APL DERMABOND .7 (GAUZE/BANDAGES/DRESSINGS) ×2
APPLIER CLIP 9.375 MED OPEN (MISCELLANEOUS)
APR CLP MED 9.3 20 MLT OPN (MISCELLANEOUS)
BINDER BREAST LRG (GAUZE/BANDAGES/DRESSINGS) IMPLANT
BINDER BREAST XLRG (GAUZE/BANDAGES/DRESSINGS) IMPLANT
BIOPATCH RED 1 DISK 7.0 (GAUZE/BANDAGES/DRESSINGS) ×2 IMPLANT
CANISTER SUCTION 2500CC (MISCELLANEOUS) ×2 IMPLANT
CHLORAPREP W/TINT 26ML (MISCELLANEOUS) ×2 IMPLANT
CLIP APPLIE 9.375 MED OPEN (MISCELLANEOUS) IMPLANT
CONT SPEC 4OZ CLIKSEAL STRL BL (MISCELLANEOUS) ×2 IMPLANT
COVER SURGICAL LIGHT HANDLE (MISCELLANEOUS) ×2 IMPLANT
DERMABOND ADVANCED (GAUZE/BANDAGES/DRESSINGS) ×2
DERMABOND ADVANCED .7 DNX12 (GAUZE/BANDAGES/DRESSINGS) ×1 IMPLANT
DRAIN CHANNEL 19F RND (DRAIN) ×3 IMPLANT
DRAPE CHEST BREAST 15X10 FENES (DRAPES) ×2 IMPLANT
DRAPE UTILITY 15X26 W/TAPE STR (DRAPE) ×4 IMPLANT
DRSG PAD ABDOMINAL 8X10 ST (GAUZE/BANDAGES/DRESSINGS) ×1 IMPLANT
DRSG TEGADERM 4X4.75 (GAUZE/BANDAGES/DRESSINGS) ×2 IMPLANT
ELECT CAUTERY BLADE 6.4 (BLADE) ×2 IMPLANT
ELECT COATED BLADE 2.86 ST (ELECTRODE) ×1 IMPLANT
ELECT REM PT RETURN 9FT ADLT (ELECTROSURGICAL) ×2
ELECTRODE REM PT RTRN 9FT ADLT (ELECTROSURGICAL) ×1 IMPLANT
EVACUATOR SILICONE 100CC (DRAIN) ×3 IMPLANT
GLOVE BIO SURGEON STRL SZ7.5 (GLOVE) ×2 IMPLANT
GLOVE BIOGEL PI IND STRL 7.0 (GLOVE) IMPLANT
GLOVE BIOGEL PI INDICATOR 7.0 (GLOVE) ×2
GLOVE SURG SS PI 7.0 STRL IVOR (GLOVE) ×2 IMPLANT
GOWN STRL REUS W/ TWL LRG LVL3 (GOWN DISPOSABLE) ×2 IMPLANT
GOWN STRL REUS W/TWL LRG LVL3 (GOWN DISPOSABLE) ×4
KIT BASIN OR (CUSTOM PROCEDURE TRAY) ×2 IMPLANT
KIT ROOM TURNOVER OR (KITS) ×2 IMPLANT
NS IRRIG 1000ML POUR BTL (IV SOLUTION) ×2 IMPLANT
PACK GENERAL/GYN (CUSTOM PROCEDURE TRAY) ×2 IMPLANT
PAD ARMBOARD 7.5X6 YLW CONV (MISCELLANEOUS) ×2 IMPLANT
SPECIMEN JAR LARGE (MISCELLANEOUS) ×1 IMPLANT
SPONGE GAUZE 4X4 12PLY STER LF (GAUZE/BANDAGES/DRESSINGS) ×1 IMPLANT
SUT ETHILON 3 0 FSL (SUTURE) ×2 IMPLANT
SUT MON AB 4-0 PC3 18 (SUTURE) ×3 IMPLANT
SUT VIC AB 3-0 54X BRD REEL (SUTURE) ×1 IMPLANT
SUT VIC AB 3-0 BRD 54 (SUTURE)
SUT VIC AB 3-0 SH 18 (SUTURE) ×3 IMPLANT
TOWEL OR 17X24 6PK STRL BLUE (TOWEL DISPOSABLE) ×2 IMPLANT
TOWEL OR 17X26 10 PK STRL BLUE (TOWEL DISPOSABLE) ×2 IMPLANT

## 2014-03-09 NOTE — H&P (Signed)
Jeremy [name]  01/19/2014 3:00 PM   Office Visit  MRN:  [id number]   Description: [age] year old male  Provider: Robyne AskewPaul S Toth III, MD  Department: [facility]         Diagnoses      Subareolar gynecomastia in male    -  Primary      611.1             Reason for Visit      eval breast             Current Vitals Most recent update: 01/19/2014  3:30 PM by Gilmer MorSonya Bynum, CMA      BP Pulse Temp(Src) Ht Wt BMI      124/82 75 97.5 F (36.4 C) 5\' 6"  (1.676 m) 158 lb (71.668 kg) 25.51 kg/m2             Progress Notes      Robyne AskewPaul S Toth III, MD at 01/19/2014  3:55 PM      Status: Signed            Patient ID: Jeremy [name], male   DOB: 1980/12/04, 33 y.o.   MRN: [id number]    Chief Complaint   Patient presents with   .  eval breast        HPI Jeremy [name] is a 33 y.o. male.  We're asked to see the patient in consultation by Dr. Burton Apleyonald Roberts to evaluate him for bilateral gynecomastia. The patient is a 33 year old white male who we saw about 6 years ago with painful gynecomastia. Since that time he has undergone 2 liposuction procedures to try to remove the painful breast tissue. Unfortunately these operations have been unsuccessful. He continues to complain of painful swelling beneath the nipple and the areola complex on both breasts which is worse on the left. He denies any discharge from his nipple on either side. HPI    Past Medical History   Diagnosis  Date   .  Hx of dizziness     .  Gait disturbance     .  Hypothyroidism     .  Depression     .  Dyslipidemia     .  Back pain, chronic         low   .  MS (multiple sclerosis)         probable   .  Testosterone deficiency     .  Blood transfusion without reported diagnosis           Past Surgical History   Procedure  Laterality  Date   .  None listed             Family History   Problem  Relation  Age of Onset   .  Cancer  Mother     .  Cancer  Father     .  Stroke           Grandmother   .  Hypertension   Brother          Social History History   Substance Use Topics   .  Smoking status:  Never Smoker    .  Smokeless tobacco:  Never Used   .  Alcohol Use:  No        No Known Allergies    Current Outpatient Prescriptions   Medication  Sig  Dispense  Refill   .  ALPRAZolam (XANAX) 1 MG tablet  Take 1 mg  by mouth at bedtime as needed for sleep (take 2 tabs as needed at night).         Marland Kitchen  amphetamine-dextroamphetamine (ADDERALL) 20 MG tablet  Take 20 mg by mouth daily.         .  Dimethyl Fumarate (TECFIDERA) 240 MG CPDR  Take 1 capsule (240 mg total) by mouth 2 (two) times daily.   60 capsule   5   .  fluticasone (FLONASE) 50 MCG/ACT nasal spray  as needed.         Marland Kitchen  HYDROcodone-acetaminophen (VICODIN ES) 7.5-750 MG per tablet  Take 1 tablet by mouth every 6 (six) hours as needed for pain.         Marland Kitchen  ibuprofen (ADVIL,MOTRIN) 800 MG tablet           .  ipratropium (ATROVENT) 0.03 % nasal spray           .  levothyroxine (SYNTHROID) 25 MCG tablet  Take 25 mcg by mouth daily before breakfast.         .  QNASL 80 MCG/ACT AERS           .  simvastatin (ZOCOR) 10 MG tablet  Take 10 mg by mouth at bedtime.             No current facility-administered medications for this visit.        Review of Systems Review of Systems  Constitutional: Negative.   HENT: Negative.   Eyes: Negative.   Respiratory: Negative.   Cardiovascular: Negative.   Gastrointestinal: Negative.   Endocrine: Negative.   Genitourinary: Negative.   Musculoskeletal: Negative.   Skin: Negative.   Allergic/Immunologic: Negative.   Neurological: Negative.   Hematological: Negative.   Psychiatric/Behavioral: Negative.       Blood pressure 124/82, pulse 75, temperature 97.5 F (36.4 C), height 5\' 6"  (1.676 m), weight 158 lb (71.668 kg).   Physical Exam Physical Exam  Constitutional: He is oriented to person, place, and time. He appears well-developed and well-nourished.  HENT:   Head: Normocephalic  and atraumatic.  Eyes: Conjunctivae and EOM are normal. Pupils are equal, round, and reactive to light.  Neck: Normal range of motion. Neck supple.  Cardiovascular: Normal rate, regular rhythm and normal heart sounds.   Pulmonary/Chest: Effort normal and breath sounds normal.  In the right breast there is a painful mass approximately 4 cm in diameter beneath the nipple and areola. In the left breast there is a painful mass approximately 6 cm in diameter beneath the nipple and areola. There is no palpable axillary, supraclavicular, or cervical lymphadenopathy  Abdominal: Soft. Bowel sounds are normal.  Musculoskeletal: Normal range of motion.  Neurological: He is alert and oriented to person, place, and time.  Skin: Skin is warm and dry.  Psychiatric: He has a normal mood and affect. His behavior is normal.      Data Reviewed As above   Assessment    The patient has bilateral painful gynecomastia which is worse on the left. Because of his discomfort and the fact that these areas have recurred I think it would be reasonable to excise them completely. I discussed with him in detail the risks and benefits of the operation to do this as well as some of the technical aspects and he understands and wishes to proceed      Plan    Plan for bilateral excision of gynecomastia

## 2014-03-09 NOTE — Transfer of Care (Signed)
Immediate Anesthesia Transfer of Care Note  Patient: Jeremy [name]  Procedure(s) Performed: Procedure(s): BILATERAL EXCISION GYNECOMASTIA (Bilateral)  Patient Location: PACU  Anesthesia Type:General  Level of Consciousness: awake, alert  and oriented  Airway & Oxygen Therapy: Patient Spontanous Breathing and Patient connected to nasal cannula oxygen  Post-op Assessment: Report given to PACU RN, Post -op Vital signs reviewed and stable and Patient moving all extremities X 4  Post vital signs: Reviewed and stable  Complications: No apparent anesthesia complications

## 2014-03-09 NOTE — Anesthesia Postprocedure Evaluation (Signed)
  Anesthesia Post-op Note  Patient: Jeremy [name]  Procedure(s) Performed: Procedure(s): BILATERAL EXCISION GYNECOMASTIA (Bilateral)  Patient Location: PACU  Anesthesia Type:General  Level of Consciousness: awake, alert , oriented and patient cooperative  Airway and Oxygen Therapy: Patient Spontanous Breathing  Post-op Pain: mild  Post-op Assessment: Post-op Vital signs reviewed, Patient's Cardiovascular Status Stable and Respiratory Function Stable  Post-op Vital Signs: Reviewed and stable  Last Vitals:  Filed Vitals:   03/09/14 1030  BP:   Pulse: 82  Temp:   Resp: 15    Complications: No apparent anesthesia complications

## 2014-03-09 NOTE — Telephone Encounter (Signed)
Message copied by Brennan Bailey on Mon Mar 09, 2014  4:14 PM ------      Message from: Norva Pavlov      Created: Mon Mar 09, 2014 12:48 PM       Pls call pt to set up 1 wk po appt. What I found was more than 1 week. ty TT ------

## 2014-03-09 NOTE — Anesthesia Preprocedure Evaluation (Addendum)
Anesthesia Evaluation  Patient identified by MRN, date of birth, ID band Patient awake    Reviewed: Allergy & Precautions, H&P , NPO status , Patient's Chart, lab work & pertinent test results  History of Anesthesia Complications (+) PONV and history of anesthetic complications  Airway Mallampati: II TM Distance: >3 FB Neck ROM: Full    Dental  (+) Teeth Intact, Dental Advidsory Given   Pulmonary          Cardiovascular Rhythm:Regular Rate:Normal     Neuro/Psych PSYCHIATRIC DISORDERS MS    GI/Hepatic   Endo/Other  Hypothyroidism   Renal/GU      Musculoskeletal   Abdominal (+)  Abdomen: soft.    Peds  Hematology   Anesthesia Other Findings   Reproductive/Obstetrics                         Anesthesia Physical Anesthesia Plan  ASA: II  Anesthesia Plan: General   Post-op Pain Management:    Induction: Intravenous  Airway Management Planned: Oral ETT  Additional Equipment:   Intra-op Plan:   Post-operative Plan: Extubation in OR  Informed Consent: I have reviewed the patients History and Physical, chart, labs and discussed the procedure including the risks, benefits and alternatives for the proposed anesthesia with the patient or authorized representative who has indicated his/her understanding and acceptance.   Dental Advisory Given  Plan Discussed with: Anesthesiologist, CRNA and Surgeon  Anesthesia Plan Comments:        Anesthesia Quick Evaluation

## 2014-03-09 NOTE — Op Note (Signed)
03/09/2014  9:23 AM  PATIENT:  Jeremy [name]  33 y.o. male  PRE-OPERATIVE DIAGNOSIS:  Bilateral gynecomastia  POST-OPERATIVE DIAGNOSIS:  Bilateral gynecomastia  PROCEDURE:  Procedure(s): BILATERAL EXCISION GYNECOMASTIA (Bilateral), bilateral nipple sparing mastectomy  SURGEON:  Surgeon(s) and Role:    * Robyne Askew, MD - Primary  PHYSICIAN ASSISTANT:   ASSISTANTS: none   ANESTHESIA:   general  EBL:  Total I/O In: 1600 [I.V.:1600] Out: 25 [Blood:25]  BLOOD ADMINISTERED:none  DRAINS: (2) Jackson-Pratt drain(s) with closed bulb suction in the prepectoral space   LOCAL MEDICATIONS USED:  NONE  SPECIMEN:  Source of Specimen:  bilateral breast tissue and additional right lateral margin  DISPOSITION OF SPECIMEN:  PATHOLOGY  COUNTS:  YES  TOURNIQUET:  * No tourniquets in log *  DICTATION: .Dragon Dictation After informed consent was obtained the patient was brought to the operating room and placed in the supine position on the operating room table. After adequate induction of general anesthesia the patient's bilateral chest area was prepped with ChloraPrep, allowed to dry, and draped in usual sterile manner. Attention was first turned to the left breast. The patient had a palpable mass beneath the nipple and areola complex. A periareolar incision was made along the superior aspect of the areola with a 15 blade knife. This incision was carried through the skin and subcutaneous tissue sharply with the electrocautery. At this point skin hooks were used to elevate the skin edges anteriorly towards the ceiling. Dissection was carried in the plane between the subcutaneous fat and the breast tissue. This was done circumferentially around the incision until the dissection reached the chest wall. This is essentially the same dissection as we would do for a nipple sparing mastectomy. Care was taken to try to keep the tissue behind the nipple fairly thick to preserve blood supply. Next the  breast tissue was removed from the chest wall. This was also done sharply with the electrocautery. Once the breast tissue was removed it was oriented with a short double stitch superiorly, a long double stitch laterally, And a short single stitch where the nipple was. The tissue was then sent to pathology for further evaluation. The cavity was palpated and no other mass was appreciated. A small stab incision was made lateral to the operative area with a 15 blade knife. A tonsil clamp was placed through this opening into the operative bed and used to bring a 19 [place] round Blake drain into the operative bed. The drain was anchored to the skin with a 3-0 nylon stitch. The wound was then irrigated with copious amounts of saline. The area was examined and found to be hemostatic. The incision was then closed with interrupted 4-0 Monocryl subcuticular stitches. Dermabond dressings were applied. The drain was placed to bulb suction and there was a good seal. Attention was then turned to the right breast. A similar incision was made along the superior aspect of the areola with a 15 blade knife. This incision was carried through the skin and subcutaneous tissue sharply with the electrocautery. Skin hooks were used to elevate the skin edges anteriorly towards the ceiling. The dissection was carried circumferentially around the incision between the subcutaneous tissue and the breast tissue. This dissection was continued until the dissection reached the chest wall. Again the tissue behind the nipple was preserved. The breast tissue was then removed from the chest wall with the electrocautery. Once the breast tissue was removed it was oriented with a short double stitch on the  superior surface, a long double stitch on the lateral surface, and a short single stitch to where the nipple was. The tissue was then sent to pathology for further evaluation. On palpating the cavity there was a little bit of fibrous tissue laterally  which was also removed sharply with the electrocautery and sent as additional margin. The cavity was then irrigated with copious amounts of saline and found to be hemostatic. There was no other palpable mass. A small stab incision was made lateral to the operative area with a 15 blade knife. A tonsil clamp was placed through this opening into the operative bed and used to bring a 19 [place] round Blake drain into the operative bed. The drain was anchored to the skin with a 3-0 nylon stitch. The incision was then closed with interrupted 4-0 Monocryl subcuticular stitches. Dermabond dressings were applied. The drain was placed to bulb suction and there was a good seal. The patient tolerated the procedure well. At the end of the case on needle sponge and instrument counts were correct. The patient was then awakened and taken to recovery in stable condition.  PLAN OF CARE: Discharge to home after PACU  PATIENT DISPOSITION:  PACU - hemodynamically stable.   Delay start of Pharmacological VTE agent (>24hrs) due to surgical blood loss or risk of bleeding: not applicable

## 2014-03-09 NOTE — Interval H&P Note (Signed)
History and Physical Interval Note:  03/09/2014 7:16 AM  Jeremy [name]  has presented today for surgery, with the diagnosis of bilateral gynecomastia  The various methods of treatment have been discussed with the patient and family. After consideration of risks, benefits and other options for treatment, the patient has consented to  Procedure(s): BILATERAL EXCISION GYNECOMASTIA (Bilateral) as a surgical intervention .  The patient's history has been reviewed, patient examined, no change in status, stable for surgery.  I have reviewed the patient's chart and labs.  Questions were answered to the patient's satisfaction.     TOTH III,Jabier Deese S

## 2014-03-09 NOTE — Telephone Encounter (Signed)
Called pt with po appt.  

## 2014-03-11 ENCOUNTER — Encounter (HOSPITAL_COMMUNITY): Payer: Self-pay | Admitting: General Surgery

## 2014-03-12 ENCOUNTER — Telehealth (INDEPENDENT_AMBULATORY_CARE_PROVIDER_SITE_OTHER): Payer: Self-pay

## 2014-03-12 NOTE — Telephone Encounter (Signed)
Pt s/p bilateral gynecomastia. Pt states that he has noticed that he has had some drainage on the guaze that is around his drain. Informed pt that he may have a little drainage that comes out around the tube. Informed pt to make sure that the drain is still draining inside the bulb. Pt states that most of the drainage is inside the bulb. Pt denies any redness or swelling. Informed pt that id he notices that the drain is no longer draining into the bulb to give our office a call back. Pt verbalized understanding.

## 2014-03-17 ENCOUNTER — Ambulatory Visit (INDEPENDENT_AMBULATORY_CARE_PROVIDER_SITE_OTHER): Payer: BC Managed Care – PPO | Admitting: General Surgery

## 2014-03-17 ENCOUNTER — Encounter (INDEPENDENT_AMBULATORY_CARE_PROVIDER_SITE_OTHER): Payer: Self-pay | Admitting: General Surgery

## 2014-03-17 VITALS — BP 118/70 | HR 77 | Temp 97.8°F | Ht 66.0 in | Wt 156.0 lb

## 2014-03-17 DIAGNOSIS — N62 Hypertrophy of breast: Secondary | ICD-10-CM

## 2014-03-17 NOTE — Patient Instructions (Signed)
No heavy lifting until next follow up May shower wednesday

## 2014-03-17 NOTE — Progress Notes (Signed)
Subjective:     Patient ID: Jeremy [name], male   DOB: November 21, 1980, 33 y.o.   MRN: [id number]  HPI The patient is a 33 year old white male who is one-week status post bilateral excision of gynecomastia. He has done well since the surgery and has no complaints today. His drains are putting out about 5-10 cc per day.  Review of Systems     Objective:   Physical Exam On exam both incisions on his chest wall are healing nicely with no sign of infection or surrounding. The drains were removed today and he tolerated this well.    Assessment:     The patient is one week status post excision of bilateral gynecomastia     Plan:     At this point he may start showering tomorrow. I would like him to refrain from any heavy lifting for a few more weeks. I will plan to see him back in about 2 weeks to check his progress

## 2014-04-07 ENCOUNTER — Encounter (INDEPENDENT_AMBULATORY_CARE_PROVIDER_SITE_OTHER): Payer: BC Managed Care – PPO | Admitting: General Surgery

## 2014-06-15 ENCOUNTER — Ambulatory Visit: Payer: Self-pay | Admitting: Adult Health

## 2014-08-18 ENCOUNTER — Telehealth: Payer: Self-pay | Admitting: Neurology

## 2014-08-18 MED ORDER — DIMETHYL FUMARATE 240 MG PO CPDR: 240.0000 mg | DELAYED_RELEASE_CAPSULE | Freq: Two times a day (BID) | ORAL | Status: DC

## 2014-08-18 NOTE — Telephone Encounter (Signed)
Rx has been sent to [facility] per patient request.

## 2014-08-18 NOTE — Telephone Encounter (Signed)
Patient is calling to get a new Rx for Tecfidera 240 mg to be sent to new pharmacy -  [facility] at [phone]

## 2014-08-21 ENCOUNTER — Ambulatory Visit: Payer: Self-pay | Admitting: Neurology

## 2014-08-25 ENCOUNTER — Ambulatory Visit (INDEPENDENT_AMBULATORY_CARE_PROVIDER_SITE_OTHER): Payer: No Typology Code available for payment source | Admitting: Neurology

## 2014-08-25 ENCOUNTER — Encounter: Payer: Self-pay | Admitting: Neurology

## 2014-08-25 VITALS — BP 100/68 | HR 70 | Resp 14 | Ht 66.0 in | Wt 158.0 lb

## 2014-08-25 DIAGNOSIS — G4726 Circadian rhythm sleep disorder, shift work type: Secondary | ICD-10-CM

## 2014-08-25 DIAGNOSIS — N521 Erectile dysfunction due to diseases classified elsewhere: Secondary | ICD-10-CM | POA: Diagnosis not present

## 2014-08-25 DIAGNOSIS — G35 Multiple sclerosis: Secondary | ICD-10-CM | POA: Diagnosis not present

## 2014-08-25 DIAGNOSIS — R260 Ataxic gait: Secondary | ICD-10-CM | POA: Diagnosis not present

## 2014-08-25 DIAGNOSIS — N529 Male erectile dysfunction, unspecified: Secondary | ICD-10-CM | POA: Insufficient documentation

## 2014-08-25 MED ORDER — MODAFINIL 200 MG PO TABS: 200.0000 mg | ORAL_TABLET | Freq: Every day | ORAL | Status: DC

## 2014-08-25 MED ORDER — TADALAFIL 20 MG PO TABS: 20.0000 mg | ORAL_TABLET | Freq: Every day | ORAL | Status: DC | PRN

## 2014-08-25 MED ORDER — ARMODAFINIL 250 MG PO TABS: 250.0000 mg | ORAL_TABLET | Freq: Every day | ORAL | Status: DC

## 2014-08-25 NOTE — Progress Notes (Signed)
[facility]  PATIENT: Jeremy [name] DOB: 08-18-1980  REFERRING CLINICIAN: Burton Apley  HISTORY FROM: patient REASON FOR VISIT: MS, needs to change therapy   HISTORICAL  CHIEF COMPLAINT:  Chief Complaint  Patient presents with  . Multiple Sclerosis    Sts. fatigue, cognition are worse.  He is currently taking Adderall, would like to discuss Provigil.  He also c/o difficulty starting urine stream, and urinary frequency.  Sts. ins. will not cover Tecfidera but will cover Gilenya or Aubagio.  Sts. he was told by [facility] pharmacy that this is not a pa issue.  Prior to Tecfidera, he was only on Rebif--stopped due to relapse while taking it/fim    HISTORY OF PRESENT ILLNESS:   Jeremy [name] is a 34 year old man with multiple sclerosis diagnosed in January 2012.   In 2009, he had a several week episode of right hand clumsiness associated with some electric shock sensations. He did not see a doctor about the symptoms. In December 2011, he had the onset of altered taste. A week later, he had nausea and dizziness and over the next couple days these symptoms worsened and he developed right-sided clumsiness and slurred speech. First toward his PCP who felt he might have an ear infection and he was referred to ENT. ENT evaluated him and referred him to Dr. Anne Hahn at [facility] who noted an ataxic gait and ordered an MRI of the spine and brain. Those studies were consistent with multiple sclerosis and he underwent a lumbar puncture in April 2011 that was also consistent with MS. He was started on Rebif in June 2011 and remain on the medicine until September 2014 when a repeat MRI showed the interval development of several more lesions. He was switched to Tecfidera.   He has generally tolerated Tecfidera well though he does have some flushing and occasional diarrhea.    He has done well on Tecfidera but insurance is not going to cover it further.    They have Gilenya and Aubagio on  Formulary.      In late 2014, he had labs for Gilenya and cardiology evaluation and was cleared but insurance would not cover, so he went on Tecfidera.    He had chicken pox as a child.     He has several symptoms associated with her multiple sclerosis including mild gait disturbance with ataxia, some numbness in the right arm, occasional vertigo, decreased memory, bladder dysfunction and erectile dysfunction.  He feels his gait is off balance at times. However, he has not fallen. He denies any weakness. He was having numbness in the right arm but that is better and he is not experiencing that much anymore.   He has mild urinary frequency but this is not bad enough to treat. He was also having some erectile dysfunction. However, testosterone was found to be low. Insurance is not covering that at this point.    He is not expressing any vertigo at this point.  He has fatigue. He notes that he works third shift and the transition can be difficult at times. He usually goes to bed at 5 or 6 in the mornings. However, on days he has off, he will often go to bed at 1 in the morning to try to keep a schedule more like those around him.    He often feels tired during his time at work.  He has had some depression and was placed on Prozac with benefit.    Gets anxiety thank you  be triggered by being in closed spaces and with shaving.  I personally reviewed the MRIs from September 2014. There was a moderate size focus in the left temporal lobe and another 10 fairly small periventricular and deep white matter foci. MRI of the cervical spine showed 2 foci, one at C4 and one at C6. There were no enhancing lesions on those MRIs.  I also reviewed the MRI's of the brain and cervical spine dated 05/25/2014. It showed white matter changes consistent with multiple sclerosis. There were no enhancing lesions and no change when compared to a prior MRI.  The MRI of the cervical spine showed the 2 chronic foci, one towards the  right at C4 and one toward the left at C6.  REVIEW OF SYSTEMS:  Constitutional: No fevers, chills, sweats, or change in appetite Eyes: No visual changes, double vision, eye pain Ear, nose and throat: No hearing loss, ear pain, nasal congestion, sore throat Cardiovascular: No chest pain, palpitations Respiratory:  No shortness of breath at rest or with exertion.   No wheezes GastrointestinaI: No nausea, vomiting, diarrhea, abdominal pain, fecal incontinence Genitourinary:  No dysuria, urinary retention or frequency.  No nocturia. Musculoskeletal:  No neck pain, back pain Integumentary: No rash, pruritus, skin lesions Neurological: as above Psychiatric: No depression at this time.  No anxiety Endocrine: No palpitations, diaphoresis, change in appetite, change in weigh or increased thirst Hematologic/Lymphatic:  No anemia, purpura, petechiae. Allergic/Immunologic: No itchy/runny eyes, nasal congestion, recent allergic reactions, rashes  ALLERGIES: No Known Allergies  HOME MEDICATIONS: Outpatient Prescriptions Prior to Visit  Medication Sig Dispense Refill  . ALPRAZolam (XANAX) 1 MG tablet Take 1 mg by mouth at bedtime as needed for sleep (take 2 tabs as needed at night).    . Dimethyl Fumarate (TECFIDERA) 240 MG CPDR Take 1 capsule (240 mg total) by mouth 2 (two) times daily. 60 capsule 3  . HYDROcodone-acetaminophen (VICODIN ES) 7.5-750 MG per tablet Take 1 tablet by mouth every 6 (six) hours as needed for pain.    Marland Kitchen ibuprofen (ADVIL,MOTRIN) 800 MG tablet Take 800 mg by mouth every 6 (six) hours as needed.     . Multiple Vitamin (MULTIVITAMIN WITH MINERALS) TABS tablet Take 1 tablet by mouth daily.    Marland Kitchen amphetamine-dextroamphetamine (ADDERALL) 20 MG tablet Take 20 mg by mouth daily.    Marland Kitchen levothyroxine (SYNTHROID) 25 MCG tablet Take 25 mcg by mouth daily before breakfast.    . oxyCODONE-acetaminophen (ROXICET) 5-325 MG per tablet Take 1-2 tablets by mouth every 4 (four) hours as needed.  50 tablet 0  . simvastatin (ZOCOR) 10 MG tablet Take 10 mg by mouth at bedtime.     No facility-administered medications prior to visit.    PAST MEDICAL HISTORY: Past Medical History  Diagnosis Date  . Hx of dizziness   . Gait disturbance   . Hypothyroidism   . Depression   . Dyslipidemia   . Back pain, chronic     low  . MS (multiple sclerosis)     probable  . Testosterone deficiency   . Blood transfusion without reported diagnosis   . PONV (postoperative nausea and vomiting)     Nausea  . Anxiety   . Gynecomastia, male   . Snoring     "mild" sleep apnea. Pt was not required to wear CPAP machine, instead he uses nasal spray  . Vision abnormalities     PAST SURGICAL HISTORY: Past Surgical History  Procedure Laterality Date  . Gynecomastia excision  X 2  . Gynecomastia mastectomy Bilateral 03/09/2014    Procedure: BILATERAL EXCISION GYNECOMASTIA;  Surgeon: Robyne Askew, MD;  Location: [facility] OR;  Service: General;  Laterality: Bilateral;    FAMILY HISTORY: Family History  Problem Relation Age of Onset  . Cancer Mother   . Cancer Father   . Hypertension Father   . Stroke      Grandmother  . Hypertension Brother     SOCIAL HISTORY:  History   Social History  . Marital Status: Single    Spouse Name: N/A    Number of Children: 0  . Years of Education: College    Occupational History  . Chef     Social History Main Topics  . Smoking status: Former Games developer  . Smokeless tobacco: Never Used  . Alcohol Use: 0.0 oz/week    0 Not specified per week     Comment: social  . Drug Use: No  . Sexual Activity: Not on file   Other Topics Concern  . 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    Patient has a 4 year college education.    Patient has no children.            PHYSICAL EXAM  Filed Vitals:   08/25/14 1025  BP: 100/68  Pulse: 70  Resp: 14  Height:  (1.676 m)  Weight: 158 lb (71.668 kg)     Body mass index is 25.51 kg/(m^2).   General: The patient is well-developed and well-nourished and in no acute distress  Eyes:  Funduscopic exam shows normal optic discs and retinal vessels.  Neck: The neck is supple, no carotid bruits are noted.  The neck is nontender.  Respiratory: The respiratory examination is clear.  Cardiovascular: The cardiovascular examination reveals a regular rate and rhythm, no murmurs, gallops or rubs are noted.  Skin: Extremities are without significant edema.  Neurologic Exam  Mental status: The patient is alert and oriented x 3 at the time of the examination. The patient has apparent normal recent and remote memory, with an apparently normal attention span and concentration ability.   Speech is normal.  Cranial nerves: Extraocular movements are full. Pupils are equal, round, and reactive to light and accomodation.  Visual fields are full.  Facial symmetry is present. There is good facial sensation to soft touch bilaterally.Facial strength is normal.  Trapezius and sternocleidomastoid strength is normal. No dysarthria is noted.  The tongue is midline, and the patient has symmetric elevation of the soft palate. No obvious hearing deficits are noted.  Motor:  Muscle bulk and tone are normal. Strength is  5 / 5 in all 4 extremities.   Sensory: Sensory testing is intact to pinprick, soft touch, vibration sensation, and position sense on all 4 extremities.  Coordination: Cerebellar testing reveals mildly reduced right finger-nose-finger and heel-to-shin.  Gait and station: Station and gait are normal. Tandem gait is wide. Romberg is negative.   Reflexes: Deep tendon reflexes are increased in legs with spread at both knees.   Plantar responses are normal.    DIAGNOSTIC DATA (LABS, IMAGING, TESTING) - I reviewed patient records, labs, notes, testing and imaging myself where available.  Lab Results  Component Value Date   WBC 5.4 03/02/2014   HGB  14.9 03/02/2014   HCT 43.2 03/02/2014   MCV 92.3 03/02/2014   PLT 169 03/02/2014      Component Value Date/Time  NA 142 03/02/2014 1342   K 4.9 03/02/2014 1342   CL 104 03/02/2014 1342   CO2 26 03/02/2014 1342   GLUCOSE 82 03/02/2014 1342   BUN 15 03/02/2014 1342   CREATININE 0.86 03/02/2014 1342   CALCIUM 9.6 03/02/2014 1342   GFRNONAA >90 03/02/2014 1342   GFRAA >90 03/02/2014 1342      ASSESSMENT AND PLAN  Multiple sclerosis  Shift work sleep disorder  Erectile disorder due to medical condition in male patient  Ataxic gait   In summary, Mr. Nadara Mode is a 34 year old man who was diagnosed with multiple sclerosis 4 years ago. Although he has done well on Tecfidera, he has been told that his insurance will no longer cover this medicine for him. We discussed options and he would like to go on Gilenya. He was evaluated for this about a year ago and had an eye exam and cardiologyevaluation at that time. I had her fill out a service request form and we will send it in. Hopefully we will be able to start in about one month. I would like him to be off of the Tecfidera for 3 or 4 weeks before we get started and he will take his last dose this weekend. He has fatigue associated with shift work sleep disorder I gave him a sample of Nuvigil and a prescription. Hopefully he will be able to be authorized for this or Provigil. Additionally, he has erectile dysfunction that could be due to either the MS or low testosterone. I gave him a prescription for Cialis.   he will return to see [place] for the first day observation or sooner if he has new or worsening neurologic symptoms.    Richard A. Epimenio Foot, MD, PhD 08/25/2014, 10:39 AM Certified in Neurology, Clinical Neurophysiology, Sleep Medicine, Pain Medicine and Neuroimaging  [facility] [address] [place], [place] [address] [phone]

## 2014-08-26 ENCOUNTER — Encounter: Payer: Self-pay | Admitting: *Deleted

## 2014-08-31 ENCOUNTER — Encounter: Payer: Self-pay | Admitting: *Deleted

## 2014-09-04 ENCOUNTER — Telehealth: Payer: Self-pay | Admitting: *Deleted

## 2014-09-04 NOTE — Telephone Encounter (Signed)
I have contacted ins and provided clinical info.  Request is currently under review.  I called the patient back.  He is aware.

## 2014-09-04 NOTE — Telephone Encounter (Signed)
Patient calling stating the pharmacy keep calling saying he needs a pre Auth for Cialis 20 mg. Please advise

## 2014-09-14 ENCOUNTER — Encounter: Payer: No Typology Code available for payment source | Admitting: Neurology

## 2014-09-14 NOTE — Progress Notes (Signed)
This encounter was created in error - please disregard.

## 2014-09-15 ENCOUNTER — Ambulatory Visit: Payer: Self-pay | Admitting: Neurology

## 2014-09-18 ENCOUNTER — Telehealth: Payer: Self-pay | Admitting: Neurology

## 2014-09-18 NOTE — Telephone Encounter (Signed)
LMOM for Vicky advising I do not  have pt. sched. for fdo yet/fim

## 2014-09-18 NOTE — Telephone Encounter (Signed)
Vicky, RN with Gilenya @ [phone], checking status of FDO date.  Please call and advise.

## 2014-09-21 ENCOUNTER — Telehealth: Payer: Self-pay | Admitting: *Deleted

## 2014-09-21 NOTE — Telephone Encounter (Signed)
LMOM for Fields to call.  I would like to schedule his Gilenya fdo for Mod[place], March 14, to arrive between 8-8:30am.  I realize I have him on the sched. for after 9am, but he should arrive between 8-8:30.  He should bring any snacks he wants for the day, as he will be here for a little more than 6 hours. He should wear comfortable clothing, no lotion, as he will be having 2 ekg's, and the electrodes don't stick well to lotioned skin./fim

## 2014-09-21 NOTE — Telephone Encounter (Signed)
Pt is calling stating he is ready to schedule his infusion.  Please call and advise.

## 2014-09-22 NOTE — Telephone Encounter (Signed)
[facility] is calling stating that the Rx for Gilenya needs to be asked for by Brand or Generic name. When calling back please use Ref# [id number].

## 2014-09-29 NOTE — Telephone Encounter (Signed)
Spoke with Jeremy [name] this am and gave appt. for Gilenya fdo, for tomorrow, 09-30-14, to arrive at 0815/fim

## 2014-09-30 ENCOUNTER — Ambulatory Visit (INDEPENDENT_AMBULATORY_CARE_PROVIDER_SITE_OTHER): Payer: No Typology Code available for payment source | Admitting: Neurology

## 2014-09-30 ENCOUNTER — Encounter: Payer: Self-pay | Admitting: Neurology

## 2014-09-30 VITALS — BP 108/70 | HR 70 | Resp 16

## 2014-09-30 DIAGNOSIS — G4726 Circadian rhythm sleep disorder, shift work type: Secondary | ICD-10-CM

## 2014-09-30 DIAGNOSIS — R001 Bradycardia, unspecified: Secondary | ICD-10-CM

## 2014-09-30 DIAGNOSIS — R5383 Other fatigue: Secondary | ICD-10-CM | POA: Diagnosis not present

## 2014-09-30 DIAGNOSIS — R260 Ataxic gait: Secondary | ICD-10-CM

## 2014-09-30 DIAGNOSIS — G35 Multiple sclerosis: Secondary | ICD-10-CM | POA: Diagnosis not present

## 2014-09-30 DIAGNOSIS — R269 Unspecified abnormalities of gait and mobility: Secondary | ICD-10-CM

## 2014-09-30 NOTE — Progress Notes (Signed)
[facility]  PATIENT: Jeremy [name] DOB: 09/14/1980  REFERRING CLINICIAN: Burton Apley  HISTORY FROM: patient REASON FOR VISIT: MS, needs to change therapy   HISTORICAL  CHIEF COMPLAINT:  Chief Complaint  Patient presents with  . Multiple Sclerosis    HISTORY OF PRESENT ILLNESS:   Jeremy [name] is a 34 year old man with multiple sclerosis.   Due to breakthrough disease on Rebif and difficulty tolerating Tecfidera, we are switching to Gilenya as his disease modifying therapy.      MS History:      In 2009, he had a several week episode of right hand clumsiness associated with some electric shock sensations.  In December 2011, he had the onset of altered taste. A week later, he had nausea and dizziness and over the next couple days these symptoms worsened and he developed right-sided clumsiness and slurred speech. MRI of the spine and brain were consistent with multiple sclerosis and he underwent a lumbar puncture in April 2011 that was also consistent with MS. He was started on Rebif in June 2011 and remain on the medicine until September 2014 when a repeat MRI showed the interval development of several more lesions. He was switched to Tecfidera.   He has generally tolerated Tecfidera o but not great with some flushing and occasional diarrhea.      Gait:  He feels his gait is off balance at times. However, he has not fallen. He denies any weakness. He was having numbness in the right arm but that is better and he is not experiencing that much anymore.   Bladder:   He has mild urinary frequency but this is not bad enough to treat. He was also having some erectile dysfunction.   Fatigue/Sleep:   He has fatigue and sleepiness. He notes that he works third shift and the transition can be difficult at times. He usually goes to bed at 5 or 6 in the mornings. However, on days he has off, he will often go to bed at 1 in the morning to try to keep a schedule more like those  around him.    He often feels tired during his time at work.  Mood:   He has had some depression and was placed on Prozac with benefit.    He has some occasioanl anxiety  Data:    MRIs from September 2014 showed a moderate size focus in the left temporal lobe and another 10 fairly small periventricular and deep white matter foci. MRI of the cervical spine showed 2 foci, one at C4 and one at C6. There were no enhancing lesions on those MRIs.    MRI's of the brain and cervical spine dated 05/25/2014 showed white matter changes consistent with multiple sclerosis. There were no enhancing lesions and no change when compared to a prior MRI.  The MRI of the cervical spine showed the 2 chronic foci, one towards the right at C4 and one toward the left at C6.  REVIEW OF SYSTEMS:  Constitutional: No fevers, chills, sweats, or change in appetite Eyes: No visual changes, double vision, eye pain Ear, nose and throat: No hearing loss, ear pain, nasal congestion, sore throat Cardiovascular: No chest pain, palpitations Respiratory:  No shortness of breath at rest or with exertion.   No wheezes GastrointestinaI: No nausea, vomiting, diarrhea, abdominal pain, fecal incontinence Genitourinary:  No dysuria, urinary retention or frequency.  No nocturia. Musculoskeletal:  No neck pain, back pain Integumentary: No rash, pruritus, skin lesions Neurological: as  above Psychiatric: No depression at this time.  No anxiety Endocrine: No palpitations, diaphoresis, change in appetite, change in weigh or increased thirst Hematologic/Lymphatic:  No anemia, purpura, petechiae. Allergic/Immunologic: No itchy/runny eyes, nasal congestion, recent allergic reactions, rashes  ALLERGIES: No Known Allergies  HOME MEDICATIONS: Outpatient Prescriptions Prior to Visit  Medication Sig Dispense Refill  . ALPRAZolam (XANAX) 1 MG tablet Take 1 mg by mouth at bedtime as needed for sleep (take 2 tabs as needed at night).    Marland Kitchen  amphetamine-dextroamphetamine (ADDERALL XR) 30 MG 24 hr capsule Take 30 mg by mouth daily.  0  . Armodafinil (NUVIGIL) 250 MG tablet Take 1 tablet (250 mg total) by mouth daily. 30 tablet 3  . Fingolimod HCl 0.5 MG CAPS Take 0.5 mg by mouth daily.    Marland Kitchen FLUoxetine (PROZAC) 20 MG capsule Take 20 mg by mouth daily.  3  . HYDROcodone-acetaminophen (NORCO) 7.5-325 MG per tablet Take 1 tablet by mouth 4 (four) times daily.  0  . HYDROcodone-acetaminophen (VICODIN ES) 7.5-750 MG per tablet Take 1 tablet by mouth every 6 (six) hours as needed for pain.    Marland Kitchen ibuprofen (ADVIL,MOTRIN) 800 MG tablet Take 800 mg by mouth every 6 (six) hours as needed.     Marland Kitchen levothyroxine (SYNTHROID, LEVOTHROID) 50 MCG tablet Take 50 mcg by mouth daily.  4  . Multiple Vitamin (MULTIVITAMIN WITH MINERALS) TABS tablet Take 1 tablet by mouth daily.    . simvastatin (ZOCOR) 20 MG tablet Take 20 mg by mouth daily.  4  . tadalafil (CIALIS) 20 MG tablet Take 1 tablet (20 mg total) by mouth daily as needed for erectile dysfunction. 10 tablet 11  . Dimethyl Fumarate (TECFIDERA) 240 MG CPDR Take 1 capsule (240 mg total) by mouth 2 (two) times daily. 60 capsule 3   No facility-administered medications prior to visit.    PAST MEDICAL HISTORY: Past Medical History  Diagnosis Date  . Hx of dizziness   . Gait disturbance   . Hypothyroidism   . Depression   . Dyslipidemia   . Back pain, chronic     low  . MS (multiple sclerosis)     probable  . Testosterone deficiency   . Blood transfusion without reported diagnosis   . PONV (postoperative nausea and vomiting)     Nausea  . Anxiety   . Gynecomastia, male   . Snoring     "mild" sleep apnea. Pt was not required to wear CPAP machine, instead he uses nasal spray  . Vision abnormalities     PAST SURGICAL HISTORY: Past Surgical History  Procedure Laterality Date  . Gynecomastia excision      X 2  . Gynecomastia mastectomy Bilateral 03/09/2014    Procedure: BILATERAL EXCISION  GYNECOMASTIA;  Surgeon: Robyne Askew, MD;  Location: [facility] OR;  Service: General;  Laterality: Bilateral;    FAMILY HISTORY: Family History  Problem Relation Age of Onset  . Cancer Mother   . Cancer Father   . Hypertension Father   . Stroke      Grandmother  . Hypertension Brother     SOCIAL HISTORY:  History   Social History  . Marital Status: Single    Spouse Name: N/A  . Number of Children: 0  . Years of Education: College    Occupational History  . Chef     Social History Main Topics  . Smoking status: Former Games developer  . Smokeless tobacco: Never Used  . Alcohol Use: 0.0  oz/week    0 Standard drinks or equivalent per week     Comment: social  . Drug Use: No  . Sexual Activity: Not on file   Other Topics Concern  . 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    Patient has a 4 year college education.    Patient has no children.            PHYSICAL EXAM  Filed Vitals:   09/30/14 0923  BP: 108/70  Pulse: 70  Resp: 16    There is no weight on file to calculate BMI.   General: The patient is well-developed and well-nourished and in no acute distress  Eyes:  Funduscopic exam shows normal optic discs and retinal vessels.  Neck: The neck is supple, no carotid bruits are noted.  The neck is nontender.  Respiratory: The respiratory examination is clear.  Cardiovascular: The cardiovascular examination reveals a regular rate and rhythm, no murmurs, gallops or rubs are noted.  Skin: Extremities are without significant edema.  Neurologic Exam  Mental status: The patient is alert and oriented x 3 at the time of the examination. The patient has apparent normal recent and remote memory, with an apparently normal attention span and concentration ability.   Speech is normal.  Cranial nerves: Extraocular movements are full. Pupils are equal, round, and reactive to light and accomodation.  Visual fields are full.  Facial  symmetry is present. There is good facial sensation to soft touch bilaterally.Facial strength is normal.  Trapezius and sternocleidomastoid strength is normal. No dysarthria is noted.  The tongue is midline, and the patient has symmetric elevation of the soft palate. No obvious hearing deficits are noted.  Motor:  Muscle bulk and tone are normal. Strength is  5 / 5 in all 4 extremities.   Sensory: Sensory testing is intact to pinprick, soft touch, vibration sensation, and position sense on all 4 extremities.  Coordination: Cerebellar testing reveals mildly reduced right finger-nose-finger and heel-to-shin.  Gait and station: Station and gait are normal. Tandem gait is wide. Romberg is negative.   Reflexes: Deep tendon reflexes are increased in legs with spread at both knees.   Plantar responses are normal.    DIAGNOSTIC DATA (LABS, IMAGING, TESTING) - I reviewed patient records, labs, notes, testing and imaging myself where available.  Lab Results  Component Value Date   WBC 5.4 03/02/2014   HGB 14.9 03/02/2014   HCT 43.2 03/02/2014   MCV 92.3 03/02/2014   PLT 169 03/02/2014      Component Value Date/Time   NA 142 03/02/2014 1342   K 4.9 03/02/2014 1342   CL 104 03/02/2014 1342   CO2 26 03/02/2014 1342   GLUCOSE 82 03/02/2014 1342   BUN 15 03/02/2014 1342   CREATININE 0.86 03/02/2014 1342   CALCIUM 9.6 03/02/2014 1342   GFRNONAA >90 03/02/2014 1342   GFRAA >90 03/02/2014 1342      ASSESSMENT AND PLAN  Multiple sclerosis  Abnormality of gait  Ataxic gait  Shift work sleep disorder  Bradycardia, sinus  Other fatigue    1.  A baseline EKG was performed showing NSR, no heart blocks and no ischemic changes.   He was administered 0.5 mg Gilenya by mouth. During the next 6 hours he was monitored multiple times.   He tolerated the medication well. At no time did he report any lightheadedness, dizziness, headache, shortness of breath, nausea, visual changes  or other  symptoms. A follow-up EKG was done after 6 hours of monitoring and sinus bradycardia (56)  rhythm. There were no blocks in no ischemic changes. 2.   He will continue on Adderall for his shift work disorder since insurance would not cover Provigil/Nuvigil. He feels jittery on the Adderall.  3.  He will follow-up with me in about 3 months. He will also need to repeat an eye exam between 3 and 4 months with his ophthalmologist.  He should call sooner if he has new or worsening neurologic symptoms.  After his initial evaluation in the morning, he was seen for an additional 40 minutes over 5 more visits during his 6 hour observation. He denied lightheadedness, headache, nausea, palpitations or shortness of breath visual changes or other symptoms. During these times, heart rate was either mild sinus bradycardia or normal sinus rhythm.   Jyden Kromer A. Epimenio Foot, MD, PhD 09/30/2014, 11:32 AM Certified in Neurology, Clinical Neurophysiology, Sleep Medicine, Pain Medicine and Neuroimaging  [facility] [address], [address] [place], [place] [address] [phone]

## 2014-10-05 ENCOUNTER — Ambulatory Visit: Payer: Self-pay | Admitting: Neurology

## 2014-10-07 ENCOUNTER — Telehealth: Payer: Self-pay | Admitting: Neurology

## 2014-10-07 NOTE — Telephone Encounter (Signed)
Spoke with Apolinar Junes at [facility] who sts. Gilenya rx. was released to [facility], and he has not been made aware of any problems.  Spoke with [facility] who sts. rx is old--from 2011.  I advised fdo was just March 9,2016.  It appears that when pt. was seeing Dr. Anne Hahn, he rx'd Gilenya at one point--and [facility] is seeing that rx., but not the one from Last week.  I gave verbal rx. for Gilenya 0.5mg  po qd, #90 with 3 additional r/f's, and advised pt. needs rx. shipped asap, as he only has 4-5 tabs from his starter pk left. I was assured they will contact pt. no later than tomorrow to arrange shipment.  Spoke with Jonny Ruiz and made him aware of all of above/fim

## 2014-10-07 NOTE — Telephone Encounter (Signed)
Patient is calling regarding a Rx and an appointment. Patient would not give any more details. Thank you.

## 2014-10-07 NOTE — Telephone Encounter (Signed)
Patient stated Rx Gilenya wasn't signed and [facility] @ [phone] requesting call in order to fill Rx per pt.  Patient has only a few tablets left.  Please call and advise.

## 2014-10-14 ENCOUNTER — Encounter: Payer: Self-pay | Admitting: *Deleted

## 2014-10-14 ENCOUNTER — Other Ambulatory Visit: Payer: Self-pay | Admitting: *Deleted

## 2014-10-14 MED ORDER — FINGOLIMOD HCL 0.5 MG PO CAPS: 0.5000 mg | ORAL_CAPSULE | Freq: Every day | ORAL | Status: DC

## 2014-10-22 ENCOUNTER — Telehealth: Payer: Self-pay | Admitting: Neurology

## 2014-10-22 NOTE — Telephone Encounter (Signed)
Spoke with Jeremy [name] and advised fdo was done 09-30-14/fim

## 2014-10-22 NOTE — Telephone Encounter (Signed)
Tim with [facility] is calling regarding information about the service request form that was sent to [facility] regarding the patient. Please call.

## 2014-11-26 ENCOUNTER — Telehealth: Payer: Self-pay | Admitting: Neurology

## 2014-11-26 NOTE — Telephone Encounter (Signed)
Patient called requesting a refill for Armodafinil (NUVIGIL) 250 MG tablet and also needs a prior authorization for his new insurance, [employer]. Please call and advice # [phone]

## 2014-11-26 NOTE — Telephone Encounter (Signed)
Ins has been contacted and provided with clinical info for Nuvigil  Request is currently under review.  Ref Key: [id number] I called the patient back to advise.  He is aware.  Says he wants to wait and see if request gets approved before a refill is sent.

## 2014-12-01 ENCOUNTER — Telehealth: Payer: Self-pay

## 2014-12-01 ENCOUNTER — Telehealth: Payer: Self-pay | Admitting: Neurology

## 2014-12-01 NOTE — Telephone Encounter (Signed)
[employer] [place] has approved the request for coverage on Nuvigil effective until 11/24/2016, or until policy changes or is terminated Ref # [id number] I tried to call the patient to advise.  Got no answer.  Mailbox was full, unable to leave message.  Ins indicates they have mailed the patient a copy of the approval letter.

## 2014-12-01 NOTE — Telephone Encounter (Signed)
I called back.  Spoke with [employer].  Apparently they faxed the request to wrong office.  I have, however, contacted patient's new ins and provided clinical info.  Was able to get an approval for coverage on Gilenya effective until 07/23/2038 or until policy changes or is terminated Ref # [id number]  I called patient to advise, but got no answer.  Mailbox was full, unable to leave message.

## 2014-12-01 NOTE — Telephone Encounter (Signed)
Jeremy [name] with [facility] is calling in regard to a prior authorization for Gilenya for patient. Request faxe on 5/4. Please call [phone].  Thanks!

## 2014-12-03 ENCOUNTER — Telehealth: Payer: Self-pay

## 2014-12-03 NOTE — Telephone Encounter (Signed)
Patient would like a refill on Nuvigil 250mg .  Asked that the Rx be sent to [facility].  Thank you.

## 2014-12-03 NOTE — Telephone Encounter (Signed)
I have spoken with Jeremy [name] and advised he has r/f of Nuvigil available at pharmacy and since pa has been done, he can pick them up.  He also c/o new sx. of tingling left 5th finger and palm of left hand, onset last Sunday.  Tingling has not progressed since it started.  Per RAS, I have advised that tingling is more likely due to ulner nerve comprssion/injury, and he should call us back if it does  not improve in the next week or so.  Jeremy [name] verbalized understanding of same/fim

## 2014-12-03 NOTE — Telephone Encounter (Signed)
Patient called and stated that Faith RN. Called him and stated that he was in need of a pre-authorization for the medication and asked him to return the call. Please call and advise [phone]).

## 2014-12-03 NOTE — Telephone Encounter (Signed)
I called back and spoke with the patient.  Jeremy [name] he is in need of a refill auth on Nuvigil.   PA was already approved for this med.  I called the pharmacy and spoke with pharmacist.  He verified they do have PA approval on file.

## 2014-12-07 ENCOUNTER — Ambulatory Visit (INDEPENDENT_AMBULATORY_CARE_PROVIDER_SITE_OTHER): Payer: BLUE CROSS/BLUE SHIELD | Admitting: Podiatry

## 2014-12-07 ENCOUNTER — Encounter: Payer: Self-pay | Admitting: Podiatry

## 2014-12-07 ENCOUNTER — Telehealth: Payer: Self-pay | Admitting: Neurology

## 2014-12-07 VITALS — BP 126/74 | HR 84 | Ht 66.0 in | Wt 160.0 lb

## 2014-12-07 DIAGNOSIS — M216X2 Other acquired deformities of left foot: Secondary | ICD-10-CM | POA: Insufficient documentation

## 2014-12-07 DIAGNOSIS — M21969 Unspecified acquired deformity of unspecified lower leg: Secondary | ICD-10-CM | POA: Insufficient documentation

## 2014-12-07 DIAGNOSIS — M216X1 Other acquired deformities of right foot: Secondary | ICD-10-CM

## 2014-12-07 DIAGNOSIS — R269 Unspecified abnormalities of gait and mobility: Secondary | ICD-10-CM | POA: Diagnosis not present

## 2014-12-07 DIAGNOSIS — M7740 Metatarsalgia, unspecified foot: Secondary | ICD-10-CM | POA: Diagnosis not present

## 2014-12-07 DIAGNOSIS — M7741 Metatarsalgia, right foot: Secondary | ICD-10-CM | POA: Insufficient documentation

## 2014-12-07 DIAGNOSIS — M7742 Metatarsalgia, left foot: Principal | ICD-10-CM

## 2014-12-07 MED ORDER — FINGOLIMOD HCL 0.5 MG PO CAPS: 0.5000 mg | ORAL_CAPSULE | Freq: Every day | ORAL | Status: DC

## 2014-12-07 NOTE — Telephone Encounter (Signed)
I have spoken with Jeremy [name] at [facility] who sts. they do not need anything else regarding Gilenya rx.--that rx. is set to ship on 5-19, and Jeremy [name] should have it on 12-11-14.  I have spoken with Jonny Ruiz and given him this update/fim

## 2014-12-07 NOTE — Telephone Encounter (Signed)
Patient called and stated that now the pharmacy is telling him they need the DAW on the rx. And the pharmacy has also stated that the expiration date is not clear. Please call and advise.

## 2014-12-07 NOTE — Progress Notes (Signed)
Subjective: Pain on side of both feet, hurts to wear shoes on side, makes him to wear clogs. Shoes wore out outside after 6 months( x 8 years).  Having foot problem for years. It is hard for him to run or weight bearing exercise. He does cycling for exercise.  Numb on left lateral arm unknown etiology x 1 week. 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   Review of Systems - General ROS: negative  Objective: Dermatologic: Normal findings.  Vascular: All pedal pulses are palpable. No edema or erythema.  Neurologic: Epicritic and tactile sensations are grossly intact.  Pain in left MPJ area L>R.  Orthopedic: STJ excess pronation bilateral. Elevated first ray upon loading of forefoot. Forefoot varus bilateral and lateral weight shifting. Radiographic examination reveal flattened first Metatarsal head left, short first metatarsal bilateral, elevated first metatarsal bilateral, anterior advancement of CYMA line bilateral.   Assessment:  Forefoot varus with lateral weight shifting. Short and elevated first ray bilateral. Excess STJ pronation bilateral.  Plan: Reviewed findings and available treatment options. Metatarsal binder dispensed to add stability on mid foot area bilateral. Both feet casted for orthotics. As per request surgery consent form reviewed for STJ arthroereisis left, Cotton osteotomy with bone graft left.

## 2014-12-07 NOTE — Telephone Encounter (Signed)
I have spoken with Ishaq this am.  New rx. for Gilenya escribed to [facility].  I spoke with [facility] to ensure they were aware rx. has been escribed; they stated they needed Markevious's new ins. card, so I have also faxed ins. info to [facility] at fax # [phone], with fax confirmation received/fim

## 2014-12-07 NOTE — Telephone Encounter (Signed)
Jesus from [facility] [phone]) called with questions regarding the patients Rx. GILENYA. Please call and advise.

## 2014-12-07 NOTE — Telephone Encounter (Signed)
Patient called stating that he called [facility] and stated that the pharmacy has not received everything they need. Please call and advise patient. Patient can be reached @ [phone]

## 2014-12-07 NOTE — Patient Instructions (Signed)
Seen for bilateral foot pain. Reviewed findings. Metatarsal binder dispense. Casted for orthotics. Possible surgical options, Subtalar joint arthroereisis, Cotton osteotomy with bone graft on left foot reviewed.

## 2014-12-07 NOTE — Telephone Encounter (Signed)
Patient is calling and states that his pharmacy [facility] is having problems with the directions to fill his Rx Gilenya.  Please call.

## 2014-12-07 NOTE — Telephone Encounter (Signed)
I have spoken with [facility] and clarified that pt. may have generic (Fingolimod).  I lmom for Jeremy [name] that I have spoken with pharmacy again.  He does not need to return this call unless he has questions/concerns/fim

## 2014-12-09 ENCOUNTER — Encounter: Payer: Self-pay | Admitting: Neurology

## 2014-12-09 ENCOUNTER — Ambulatory Visit (INDEPENDENT_AMBULATORY_CARE_PROVIDER_SITE_OTHER): Payer: BLUE CROSS/BLUE SHIELD | Admitting: Neurology

## 2014-12-09 VITALS — BP 160/70 | HR 68 | Resp 14 | Wt 159.0 lb

## 2014-12-09 DIAGNOSIS — G35 Multiple sclerosis: Secondary | ICD-10-CM

## 2014-12-09 DIAGNOSIS — R269 Unspecified abnormalities of gait and mobility: Secondary | ICD-10-CM | POA: Diagnosis not present

## 2014-12-09 DIAGNOSIS — G5620 Lesion of ulnar nerve, unspecified upper limb: Secondary | ICD-10-CM | POA: Insufficient documentation

## 2014-12-09 DIAGNOSIS — Z79899 Other long term (current) drug therapy: Secondary | ICD-10-CM | POA: Diagnosis not present

## 2014-12-09 DIAGNOSIS — G5622 Lesion of ulnar nerve, left upper limb: Secondary | ICD-10-CM

## 2014-12-09 DIAGNOSIS — R202 Paresthesia of skin: Secondary | ICD-10-CM | POA: Insufficient documentation

## 2014-12-09 MED ORDER — METHYLPREDNISOLONE 4 MG PO TBPK: ORAL_TABLET | ORAL | Status: DC

## 2014-12-09 NOTE — Progress Notes (Signed)
[facility]  PATIENT: Jeremy [name] DOB: 02-22-1981  REFERRING CLINICIAN: Burton Apley  HISTORY FROM: patient REASON FOR VISIT: MS, needs to change therapy   HISTORICAL  CHIEF COMPLAINT:  Chief Complaint  Patient presents with  . Multiple Sclerosis    Sts. onset of numbness/tingling side of left hand, left 4th and 5th fingers.  Sx. are constant, just exacerbated at times. Worse in the mornings worse when  he is on the computer./fim  . Numbness    HISTORY OF PRESENT ILLNESS:  Jeremy [name] is a 34 year old man with multiple sclerosis who had the onset of left arm numbness and pain about  10 days ago. The numbness and is in the distribution of the palmar aspect of the pinky and the side and palmar aspect of the ulnar half of the fourth finger. He has not noted weakness but when tested today he had some. He has some tenderness near the elbow. He notes that he leans on his left arm at work for much of the day. He has not had similar symptoms in the past. He does not note numbness weakness or clumsiness anywhere else in the body.  In the past, he would note occasional tingling into the ulnar distribution but symptoms were quickly resolved and he never had any pain near the elbow.  He notes that he has a lot of of nausea and that he has had that since being diagnosed with MS. He does not vomit.    He notes that riding in a car will increase to nausea. He did not have carsickness when he was younger. He denies any change in his voice or with swallowing. The nausea predated the starting of Fingolimod.     He denies GERD.  He feels he is eating  A little less than he usually does. He sometimes skips meals.  He has MS and sometimes has difficulty with [name] off-balance gait. He also has some urinary frequency at times. He has not noted any change in any of the symptoms. He was started on Gilenya about 2 months ago. He is tolerating it well.   He does not feel that the nausea worse  since he started Gilenya. His MS has been stable and he has not had any exacerbations since starting Gilenya.   REVIEW OF SYSTEMS:  Constitutional: No fevers, chills, sweats, or change in appetite Eyes: No visual changes, double vision, eye pain Ear, nose and throat: No hearing loss, ear pain, nasal congestion, sore throat Cardiovascular: No chest pain, palpitations Respiratory:  No shortness of breath at rest or with exertion.   No wheezes GastrointestinaI: Notes nausea.   No vomiting, diarrhea, abdominal pain, fecal incontinence Genitourinary:  No dysuria, urinary retention or frequency.  No nocturia. Musculoskeletal:  No neck pain, back pain Integumentary: No rash, pruritus, skin lesions Neurological: as above Psychiatric: No depression at this time.  No anxiety Endocrine: No palpitations, diaphoresis, change in appetite, change in weigh or increased thirst Hematologic/Lymphatic:  No anemia, purpura, petechiae. Allergic/Immunologic: No itchy/runny eyes, nasal congestion, recent allergic reactions, rashes  ALLERGIES: Allergies  Allergen Reactions  . Sulfa Antibiotics Nausea Only    HOME MEDICATIONS: Outpatient Prescriptions Prior to Visit  Medication Sig Dispense Refill  . ALPRAZolam (XANAX) 1 MG tablet Take 1 mg by mouth at bedtime as needed for sleep (take 2 tabs as needed at night).    Marland Kitchen amphetamine-dextroamphetamine (ADDERALL XR) 30 MG 24 hr capsule Take 30 mg by mouth daily.  0  .  Armodafinil (NUVIGIL) 250 MG tablet Take 1 tablet (250 mg total) by mouth daily. 30 tablet 3  . Fingolimod HCl 0.5 MG CAPS Take 1 capsule (0.5 mg total) by mouth daily. 90 capsule 3  . FLUoxetine (PROZAC) 20 MG capsule Take 20 mg by mouth daily.  3  . HYDROcodone-acetaminophen (VICODIN ES) 7.5-750 MG per tablet Take 1 tablet by mouth every 6 (six) hours as needed for pain.    Marland Kitchen ibuprofen (ADVIL,MOTRIN) 800 MG tablet Take 800 mg by mouth every 6 (six) hours as needed.     Marland Kitchen levothyroxine (SYNTHROID,  LEVOTHROID) 50 MCG tablet Take 50 mcg by mouth daily.  4  . Multiple Vitamin (MULTIVITAMIN WITH MINERALS) TABS tablet Take 1 tablet by mouth daily.    . simvastatin (ZOCOR) 20 MG tablet Take 20 mg by mouth daily.  4  . tadalafil (CIALIS) 20 MG tablet Take 1 tablet (20 mg total) by mouth daily as needed for erectile dysfunction. 10 tablet 11  . HYDROcodone-acetaminophen (NORCO) 7.5-325 MG per tablet Take 1 tablet by mouth 4 (four) times daily.  0   No facility-administered medications prior to visit.    PAST MEDICAL HISTORY: Past Medical History  Diagnosis Date  . Hx of dizziness   . Gait disturbance   . Hypothyroidism   . Depression   . Dyslipidemia   . Back pain, chronic     low  . MS (multiple sclerosis)     probable  . Testosterone deficiency   . Blood transfusion without reported diagnosis   . PONV (postoperative nausea and vomiting)     Nausea  . Anxiety   . Gynecomastia, male   . Snoring     "mild" sleep apnea. Pt was not required to wear CPAP machine, instead he uses nasal spray  . Vision abnormalities     PAST SURGICAL HISTORY: Past Surgical History  Procedure Laterality Date  . Gynecomastia excision      X 2  . Gynecomastia mastectomy Bilateral 03/09/2014    Procedure: BILATERAL EXCISION GYNECOMASTIA;  Surgeon: Robyne Askew, MD;  Location: [facility] OR;  Service: General;  Laterality: Bilateral;    FAMILY HISTORY: Family History  Problem Relation Age of Onset  . Cancer Mother   . Cancer Father   . Hypertension Father   . Stroke      Grandmother  . Hypertension Brother     SOCIAL HISTORY:  History   Social History  . Marital Status: Single    Spouse Name: N/A  . Number of Children: 0  . Years of Education: College    Occupational History  . Chef     Social History Main Topics  . Smoking status: Former Games developer  . Smokeless tobacco: Never Used  . Alcohol Use: 0.0 oz/week    0 Standard drinks or equivalent per week     Comment: social  . Drug Use:  No  . Sexual Activity: Not on file   Other Topics Concern  . 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    Patient has a 4 year college education.    Patient has no children.            PHYSICAL EXAM  Filed Vitals:   12/09/14 1300  BP: 160/70  Pulse: 68  Resp: 14  Weight: 159 lb (72.122 kg)    Body mass index is 25.68 kg/(m^2).   General: The patient is well-developed and  well-nourished and in no acute distress  Neck: The neck is nontender.  Musculoskeletal: He is tender in the left forearm over the cubital tunnel.   Range of motion of the joints in the left arm is normal.  Neurologic Exam  Mental status: The patient is alert and oriented x 3 at the time of the examination.   Cranial nerves: Extraocular movements are full.  Facial symmetry is present. There is good facial sensation to soft touch bilaterally.Facial strength is normal.  Trapezius and sternocleidomastoid strength is normal. No dysarthria is noted.    Motor:  Muscle bulk and tone are normal. Strength is  4/ 5 in ulnar innervated muscles of the left hand.   Sensory: Sensory testing shows decreased left 5th digit and ulnar half of 4th digit in the palmar and lateral aspects.     Gait and station: Station and gait are normal. Tandem gait is wide.   Reflexes: Deep tendon reflexes are normal in arms   DIAGNOSTIC DATA (LABS, IMAGING, TESTING) - I reviewed patient records, labs, notes, testing and imaging myself where available.  Lab Results  Component Value Date   WBC 5.4 03/02/2014   HGB 14.9 03/02/2014   HCT 43.2 03/02/2014   MCV 92.3 03/02/2014   PLT 169 03/02/2014      Component Value Date/Time   NA 142 03/02/2014 1342   K 4.9 03/02/2014 1342   CL 104 03/02/2014 1342   CO2 26 03/02/2014 1342   GLUCOSE 82 03/02/2014 1342   BUN 15 03/02/2014 1342   CREATININE 0.86 03/02/2014 1342   CALCIUM 9.6 03/02/2014 1342   GFRNONAA >90 03/02/2014 1342   GFRAA >90  03/02/2014 1342      ASSESSMENT AND PLAN  Multiple sclerosis - Plan: CBC with Differential/Platelet  Paresthesia  Ulnar nerve entrapment, left  Abnormality of gait  High risk medication use    1.   Steroid Dosepak for ulnar neuropathy. 2.   If he is not better within a couple weeks he will call us back and we will set up a nerve conduction/EMG study of the left arm. I discussed with him that if he has neuropathic changes in the muscles of the ulnar nerve or if his pain has become very significant that we would consider sending him to a surgeon for [name] ulnar nerve transposition. 3.   I am not sure what is causing his nausea. This might be MS related as he has had some problems with balance though this might also be a primary GI issue. Sensory referral to GI if this worsens or if he starts to have vomiting.    4.   He has been on Gilenya about 8 weeks. I will go ahead and check a CBC to assess the severity of lymphopenia.. He has [name] eye doctor appointment for one month with Dr. Elmer Picker. He will follow-up with me in about 3 months.  He should call sooner if he has new or worsening neurologic symptoms.   Richard A. Epimenio Foot, MD, PhD 12/09/2014, 1:20 PM Certified in Neurology, Clinical Neurophysiology, Sleep Medicine, Pain Medicine and Neuroimaging  [facility] [address], [address] [place], [place] [address] [phone]

## 2014-12-10 LAB — CBC WITH DIFFERENTIAL/PLATELET
BASOS ABS: 0 10*3/uL (ref 0.0–0.2)
Basos: 0 %
EOS (ABSOLUTE): 0 10*3/uL (ref 0.0–0.4)
Eos: 0 %
HEMOGLOBIN: 14 g/dL (ref 12.6–17.7)
Hematocrit: 41.6 % (ref 37.5–51.0)
IMMATURE GRANULOCYTES: 0 %
Immature Grans (Abs): 0 10*3/uL (ref 0.0–0.1)
LYMPHS: 4 %
Lymphocytes Absolute: 0.4 10*3/uL — ABNORMAL LOW (ref 0.7–3.1)
MCH: 30.8 pg (ref 26.6–33.0)
MCHC: 33.7 g/dL (ref 31.5–35.7)
MCV: 91 fL (ref 79–97)
Monocytes Absolute: 0.4 10*3/uL (ref 0.1–0.9)
Monocytes: 5 %
Neutrophils Absolute: 8.4 10*3/uL — ABNORMAL HIGH (ref 1.4–7.0)
Neutrophils: 91 %
Platelets: 193 10*3/uL (ref 150–379)
RBC: 4.55 x10E6/uL (ref 4.14–5.80)
RDW: 13.5 % (ref 12.3–15.4)
WBC: 9.3 10*3/uL (ref 3.4–10.8)

## 2014-12-14 ENCOUNTER — Telehealth: Payer: Self-pay | Admitting: *Deleted

## 2014-12-14 NOTE — Telephone Encounter (Signed)
LMOM for Jeremy [name] (identified vm) that per RAS, labwork was ok--lymphocytes were a little low, but to be expected on Gilenya, not low enough to cause concern.  He does not need to return this call unless he has questions/concerns/fim

## 2014-12-14 NOTE — Telephone Encounter (Signed)
-----   Message from Asa Lente, MD sent at 12/10/2014  8:19 AM EDT ----- Blood count is fine (low lymphocytes are expected with Gilenya

## 2014-12-23 ENCOUNTER — Ambulatory Visit (INDEPENDENT_AMBULATORY_CARE_PROVIDER_SITE_OTHER): Payer: BLUE CROSS/BLUE SHIELD | Admitting: Neurology

## 2014-12-23 ENCOUNTER — Encounter: Payer: Self-pay | Admitting: Neurology

## 2014-12-23 VITALS — BP 110/68 | HR 64 | Resp 12 | Ht 66.0 in | Wt 159.0 lb

## 2014-12-23 DIAGNOSIS — R202 Paresthesia of skin: Secondary | ICD-10-CM

## 2014-12-23 DIAGNOSIS — G5622 Lesion of ulnar nerve, left upper limb: Secondary | ICD-10-CM | POA: Diagnosis not present

## 2014-12-23 DIAGNOSIS — G35 Multiple sclerosis: Secondary | ICD-10-CM | POA: Diagnosis not present

## 2014-12-23 NOTE — Progress Notes (Signed)
[facility]  PATIENT: Jeremy [name] DOB: 1980-12-28  REFERRING CLINICIAN: Burton Apley  HISTORY FROM: patient REASON FOR VISIT: MS, needs to change therapy   HISTORICAL  CHIEF COMPLAINT:  Chief Complaint  Patient presents with  . Multiple Sclerosis    Sts. he continues to tolerate Gilenya well. Sts. everything is about the same.  Sts. numbness in left 4th and 5th fingers is worse.  He doesn't feel oral steroids helped much./fim  . Ulnar nerve entrapment, left    HISTORY OF PRESENT ILLNESS:  Jeremy [name] is a 34 year old man with multiple sclerosis who has had tenderness in the left elbow with funny bone sensations x years and has had persistent severe left arm numbness and pain x 6 weeks.     The numbness  is in the distribution of the hypothenar eminence and palmar aspect of the ulnar half of the fourth finger and palmar 5th finger. He denies weakness. He has more tenderness near the elbow than last month. He is padding his work surface at work to avoid pressure on cubital tunnel / ulnar groove as much as possible. He does not note numbness weakness or clumsiness anywhere else in the body. A steroid pack did not help.   He has noted some twitching in the left FDIO muscle the past 2 weeks (none right now).      He has MS and sometimes has difficulty with an off-balance gait. He also has some urinary frequency at times. He has not noted any change in any of the symptoms. He was started on Gilenya about 2 months ago. He is tolerating it well.   He does not feel that the nausea worse since he started Gilenya. His MS has been stable and he has not had any exacerbations since starting Gilenya.   REVIEW OF SYSTEMS:  Constitutional: No fevers, chills, sweats, or change in appetite Eyes: No visual changes, double vision, eye pain Ear, nose and throat: No hearing loss, ear pain, nasal congestion, sore throat Cardiovascular: No chest pain, palpitations Respiratory:  No  shortness of breath at rest or with exertion.   No wheezes GastrointestinaI: Notes nausea.   No vomiting, diarrhea, abdominal pain, fecal incontinence Genitourinary:  No dysuria, urinary retention or frequency.  No nocturia. Musculoskeletal:  No neck pain, back pain Integumentary: No rash, pruritus, skin lesions Neurological: as above Psychiatric: No depression at this time.  No anxiety Endocrine: No palpitations, diaphoresis, change in appetite, change in weigh or increased thirst Hematologic/Lymphatic:  No anemia, purpura, petechiae. Allergic/Immunologic: No itchy/runny eyes, nasal congestion, recent allergic reactions, rashes  ALLERGIES: Allergies  Allergen Reactions  . Sulfa Antibiotics Nausea Only    HOME MEDICATIONS: Outpatient Prescriptions Prior to Visit  Medication Sig Dispense Refill  . ALPRAZolam (XANAX) 1 MG tablet Take 1 mg by mouth at bedtime as needed for sleep (take 2 tabs as needed at night).    Marland Kitchen amphetamine-dextroamphetamine (ADDERALL XR) 30 MG 24 hr capsule Take 30 mg by mouth daily.  0  . Armodafinil (NUVIGIL) 250 MG tablet Take 1 tablet (250 mg total) by mouth daily. 30 tablet 3  . FLUoxetine (PROZAC) 20 MG capsule Take 20 mg by mouth daily.  3  . ibuprofen (ADVIL,MOTRIN) 800 MG tablet Take 800 mg by mouth every 6 (six) hours as needed.     Marland Kitchen levothyroxine (SYNTHROID, LEVOTHROID) 50 MCG tablet Take 50 mcg by mouth daily.  4  . methylPREDNISolone (MEDROL DOSEPAK) 4 MG TBPK tablet Take as dirested 21 tablet  0  . Multiple Vitamin (MULTIVITAMIN WITH MINERALS) TABS tablet Take 1 tablet by mouth daily.    . simvastatin (ZOCOR) 20 MG tablet Take 20 mg by mouth daily.  4  . tadalafil (CIALIS) 20 MG tablet Take 1 tablet (20 mg total) by mouth daily as needed for erectile dysfunction. 10 tablet 11  . Fingolimod HCl 0.5 MG CAPS Take 1 capsule (0.5 mg total) by mouth daily. 90 capsule 3  . HYDROcodone-acetaminophen (VICODIN ES) 7.5-750 MG per tablet Take 1 tablet by mouth  every 6 (six) hours as needed for pain.     No facility-administered medications prior to visit.    PAST MEDICAL HISTORY: Past Medical History  Diagnosis Date  . Hx of dizziness   . Gait disturbance   . Hypothyroidism   . Depression   . Dyslipidemia   . Back pain, chronic     low  . MS (multiple sclerosis)     probable  . Testosterone deficiency   . Blood transfusion without reported diagnosis   . PONV (postoperative nausea and vomiting)     Nausea  . Anxiety   . Gynecomastia, male   . Snoring     "mild" sleep apnea. Pt was not required to wear CPAP machine, instead he uses nasal spray  . Vision abnormalities     PAST SURGICAL HISTORY: Past Surgical History  Procedure Laterality Date  . Gynecomastia excision      X 2  . Gynecomastia mastectomy Bilateral 03/09/2014    Procedure: BILATERAL EXCISION GYNECOMASTIA;  Surgeon: Robyne Askew, MD;  Location: [facility] OR;  Service: General;  Laterality: Bilateral;    FAMILY HISTORY: Family History  Problem Relation Age of Onset  . Cancer Mother   . Cancer Father   . Hypertension Father   . Stroke      Grandmother  . Hypertension Brother     SOCIAL HISTORY:  History   Social History  . Marital Status: Single    Spouse Name: N/A  . Number of Children: 0  . Years of Education: College    Occupational History  . Chef     Social History Main Topics  . Smoking status: Former Games developer  . Smokeless tobacco: Never Used  . Alcohol Use: 0.0 oz/week    0 Standard drinks or equivalent per week     Comment: social  . Drug Use: No  . Sexual Activity: Not on file   Other Topics Concern  . 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    Patient has a 4 year college education.    Patient has no children.            PHYSICAL EXAM  Filed Vitals:   12/23/14 1442  BP: 110/68  Pulse: 64  Resp: 12  Height: 5\' 6"  (1.676 m)  Weight: 159 lb (72.122 kg)    Body mass index is 25.68  kg/(m^2).   General: The patient is well-developed and well-nourished and in no acute distress  Neck: The neck is nontender.  Musculoskeletal: He is more tender in the left forearm over the cubital tunnel than last visit.   Range of motion of the joints in the left arm is normal.   Has large cut in right palm.    Neurologic Exam  Mental status: The patient is alert and oriented x 3 at the time of the examination.   Cranial nerves: Extraocular movements are full.  Motor:  Muscle bulk and tone are normal. Strength is  4+/ 5 in ulnar innervated muscles of the left hand (no worse than last visit)  Sensory: Sensory testing shows decreased left 5th digit and ulnar half of 4th digit in the palmar and lateral aspects.   Has Tinel's sign over both elbows, worse on the left.    Gait and station: Station and gait are normal.    DIAGNOSTIC DATA (LABS, IMAGING, TESTING) - I reviewed patient records, labs, notes, testing and imaging myself where available.  Lab Results  Component Value Date   WBC 9.3 12/09/2014   HGB 14.9 03/02/2014   HCT 41.6 12/09/2014   MCV 92.3 03/02/2014   PLT 169 03/02/2014      Component Value Date/Time   NA 142 03/02/2014 1342   K 4.9 03/02/2014 1342   CL 104 03/02/2014 1342   CO2 26 03/02/2014 1342   GLUCOSE 82 03/02/2014 1342   BUN 15 03/02/2014 1342   CREATININE 0.86 03/02/2014 1342   CALCIUM 9.6 03/02/2014 1342   GFRNONAA >90 03/02/2014 1342   GFRAA >90 03/02/2014 1342      ASSESSMENT AND PLAN  Ulnar nerve entrapment, left - Plan: NCV with EMG(electromyography)  Paresthesia - Plan: NCV with EMG(electromyography)  Multiple sclerosis   1.   Since he is no better after Steroid Dosepak, we will check a nerve conduction/EMG study of the left arm. I discussed with him that if he has neuropathic changes in the muscles of the ulnar nerve or if his pain continues to worsen,  we would consider sending him to a surgeon for an ulnar nerve  transposition. 2.   I'll see him at EMG/NCV and he should call sooner if he has new or worsening neurologic symptoms.  Orders Placed This Encounter  Procedures  . NCV with EMG(electromyography)    Standing Status: Future     Number of Occurrences:      Standing Expiration Date: 12/23/2015    Scheduling Instructions:     Please place on Monifah Freehling schedule          Left hand/arm numbness and pain.   Exam c/w left ulnar neuropathy    Order Specific Question:  Where should this test be performed?    Answer:  [facility]    Shatera Rennert A. Epimenio Foot, MD, PhD 12/23/2014, 2:54 PM Certified in Neurology, Clinical Neurophysiology, Sleep Medicine, Pain Medicine and Neuroimaging  [facility] [address], [address] [place], [place] [address] [phone]

## 2014-12-29 ENCOUNTER — Encounter: Payer: BLUE CROSS/BLUE SHIELD | Admitting: Podiatry

## 2014-12-30 ENCOUNTER — Encounter (INDEPENDENT_AMBULATORY_CARE_PROVIDER_SITE_OTHER): Payer: Self-pay | Admitting: Neurology

## 2014-12-30 ENCOUNTER — Ambulatory Visit (INDEPENDENT_AMBULATORY_CARE_PROVIDER_SITE_OTHER): Payer: BLUE CROSS/BLUE SHIELD | Admitting: Neurology

## 2014-12-30 ENCOUNTER — Other Ambulatory Visit: Payer: Self-pay | Admitting: Neurology

## 2014-12-30 DIAGNOSIS — R202 Paresthesia of skin: Secondary | ICD-10-CM | POA: Diagnosis not present

## 2014-12-30 DIAGNOSIS — Z0289 Encounter for other administrative examinations: Secondary | ICD-10-CM

## 2014-12-30 DIAGNOSIS — G5622 Lesion of ulnar nerve, left upper limb: Secondary | ICD-10-CM | POA: Diagnosis not present

## 2014-12-30 MED ORDER — GABAPENTIN 300 MG PO CAPS: ORAL_CAPSULE | ORAL | Status: DC

## 2014-12-30 NOTE — Progress Notes (Signed)
     HISTORY:  Jeremy [name] he is a 34 year old man with multiple sclerosis who has pain at the left elbow radiating into the left fourth and fifth fingers.  He has a Tinel sign at the left elbow over the cubital tunnel.  NERVE CONDUCTION STUDIES:  The left and right median and ulnar motor and sensory responses were normal.  EMG STUDIES:  Needle EMG of select muscles of the left arm was performed. Motor unit morphology and recruitment were normal in all muscles tested.  The following muscles of the left arm were tested: Deltoid, triceps, biceps, extensive digitorum communis, first dorsal interosseous, abductor digiti minimi, abductor pollicis brevis.  IMPRESSION:  This is a normal EMG/NCV of the arms. There is no evidence of a significant mononeuropathy or radiculopathy at this time.   Evelina Lore A. Epimenio Foot, MD, PhD Certified in Neurology, Clinical Neurophysiology, Sleep Medicine, Pain Medicine and Neuroimaging  [facility] [address] [place], [place] [address] [phone]

## 2014-12-31 ENCOUNTER — Telehealth: Payer: Self-pay | Admitting: Neurology

## 2014-12-31 DIAGNOSIS — R202 Paresthesia of skin: Secondary | ICD-10-CM

## 2014-12-31 MED ORDER — TRAMADOL HCL 50 MG PO TABS: 50.0000 mg | ORAL_TABLET | Freq: Three times a day (TID) | ORAL | Status: DC | PRN

## 2014-12-31 NOTE — Telephone Encounter (Signed)
I have spoken with Jeremy [name] this afternoon.  He c/o worsening pain/numbness left elbow to left 4th and 5th fingers.  Per RAS, ok to offer Tramadol 50mg  tid prn #90 with 1 r/f.  Also may offer ortho referral, as emg/ncs was neg for ulnar neuropathy.  Barren is agreeable with both of these options.  Rx. for Tramadol printed, signed, up front [facility],  Referral to ortho made, and copy of emg/ncs, last ov note up front for him to p/u as well, in case he sees ortho not w/i [facility] system/fim

## 2014-12-31 NOTE — Telephone Encounter (Signed)
Patient called and requested to speak with the nurse regarding some nerve pain he is experiencing. He states that he is having a lot of pain in his elbow going to his hand. Please call and advise.

## 2015-01-01 ENCOUNTER — Ambulatory Visit (INDEPENDENT_AMBULATORY_CARE_PROVIDER_SITE_OTHER): Payer: BLUE CROSS/BLUE SHIELD | Admitting: Podiatry

## 2015-01-01 ENCOUNTER — Encounter: Payer: Self-pay | Admitting: Podiatry

## 2015-01-01 ENCOUNTER — Ambulatory Visit: Payer: BLUE CROSS/BLUE SHIELD

## 2015-01-01 VITALS — BP 116/79 | HR 70 | Temp 97.2°F | Resp 12

## 2015-01-01 DIAGNOSIS — M216X2 Other acquired deformities of left foot: Secondary | ICD-10-CM | POA: Diagnosis not present

## 2015-01-01 DIAGNOSIS — R52 Pain, unspecified: Secondary | ICD-10-CM

## 2015-01-01 DIAGNOSIS — M21969 Unspecified acquired deformity of unspecified lower leg: Secondary | ICD-10-CM

## 2015-01-01 DIAGNOSIS — M216X1 Other acquired deformities of right foot: Secondary | ICD-10-CM

## 2015-01-01 NOTE — Progress Notes (Signed)
Subjective:    Patient ID: Jeremy [name], male    DOB: 09-28-1980, 34 y.o.   MRN: [id number]  HPI   34 year old male presents the office today with complaints of bilateral foot pain with left greater than the right. He states that on the side of his left foot more than his right foot he has noticed bony protrusions which caused irritation shoe gear. He also brought and shoes which show that he actually will wear holes through in his shoes from where the prominences are. He previously had seen another podiatrist who recommended a subtalar joint implant and cotton osteotomy. He then followed up for second opinion and was referred to me. He states his been ongoing for possible a 6-7 years and been progressive and becoming more painful with left greater than right. He is tried shoe gear modifications, padding, offloading without any resolution. He has also had orthotics made, which do not seem to help. He feels he continues to roll off the outside of them. No other complaints at this time.  Review of Systems  Neurological: Positive for numbness.       Objective:   Physical Exam AAO x3, NAD DP/PT pulses palpable bilaterally, CRT less than 3 seconds Protective sensation intact with Simms Weinstein monofilament, vibratory sensation intact, Achilles tendon reflex intact There is a tailor's bunion prominence present bilaterally the left greater the right. Upon evaluation of his shoes as is the same area of wear within his shoe. It does appear to be more significant upon weightbearing and he has some splaying of the foot. The left >> right. There is mild decrease in medial arch height upon weightbearing. There is no pain or restriction of ankle/subtalar/midfoot/MTPJ ROM.  Upon evaluation of his orthotics, he does tend to roll toward the lateral portion, putting more pressure to the outside aspect of his foot.  No areas of tenderness to bilateral lower extremities. MMT 5/5, ROM WNL.  No open lesions or  pre-ulcerative lesions.  No overlying edema, erythema, increase in warmth to bilateral lower extremities.  No pain with calf compression, swelling, warmth, erythema bilaterally.     Assessment & Plan:  34 year old male with symptomatically tailors bunion deformity, mild flatfoot. -X-rays were obtained and reviewed with the patient.  -Treatment options discussed including all alternatives, risks, and complications -I discussed both conservative and surgical options. -Regards to orthotics I believe that he may benefit from a deeper heel cup to help control rolling off lateral aspect of the foot. I did send the orthotics back to [facility] for adjustments -Regards to surgical intervention I believe the patient would benefit from a tailor's bunionectomy due to the problems this area. Although he does have a flatfoot I believe that if we control the tailor's bunion with surgery we can correct a flatfoot with an orthotic for now. If after the tailor's bunion surgery symptoms persist she may need another more invasive surgery. I discussed this with them him detail. He would only like to proceed with correction of the tailors bunion for now.  -The incision placement as well as the postoperative course was discussed with the patient. I discussed risks of the surgery which include, but not limited to, infection, bleeding, pain, swelling, need for further surgery, delayed or nonhealing, painful or ugly scar, numbness or sensation changes, over/under correction, recurrence, transfer lesions, further deformity, hardware failure, DVT/PE, loss of toe/foot. Patient understands these risks and wishes to proceed with surgery. The surgical consent was reviewed with the patient all 3 pages  were signed. No promises or guarantees were given to the outcome of the procedure. All questions were answered to the best of my ability. Before the surgery the patient was encouraged to call the office if there is any further questions.  The surgery will be performed at the [facility] on an outpatient basis.

## 2015-01-01 NOTE — Patient Instructions (Signed)
Pre-Operative Instructions  Congratulations, you have decided to take an important step to improving your quality of life.  You can be assured that the doctors of [facility] will be with you every step of the way.  1. Plan to be at the surgery center/hospital at least 1 (one) hour prior to your scheduled time unless otherwise directed by the surgical center/hospital staff.  You must have a responsible adult accompany you, remain during the surgery and drive you home.  Make sure you have directions to the surgical center/hospital and know how to get there on time. 2. For hospital based surgery you will need to obtain a history and physical form from your family physician within 1 month prior to the date of surgery- we will give you a form for you primary physician.  3. We make every effort to accommodate the date you request for surgery.  There are however, times where surgery dates or times have to be moved.  We will contact you as soon as possible if a change in schedule is required.   4. No Aspirin/Ibuprofen for one week before surgery.  If you are on aspirin, any non-steroidal anti-inflammatory medications (Mobic, Aleve, Ibuprofen) you should stop taking it 7 days prior to your surgery.  You make take Tylenol  For pain prior to surgery.  5. Medications- If you are taking daily heart and blood pressure medications, seizure, reflux, allergy, asthma, anxiety, pain or diabetes medications, make sure the surgery center/hospital is aware before the day of surgery so they may notify you which medications to take or avoid the day of surgery. 6. No food or drink after midnight the night before surgery unless directed otherwise by surgical center/hospital staff. 7. No alcoholic beverages 24 hours prior to surgery.  No smoking 24 hours prior to or 24 hours after surgery. 8. Wear loose pants or shorts- loose enough to fit over bandages, boots, and casts. 9. No slip on shoes, sneakers are best. 10. Bring  your boot with you to the surgery center/hospital.  Also bring crutches or a walker if your physician has prescribed it for you.  If you do not have this equipment, it will be provided for you after surgery. 11. If you have not been contracted by the surgery center/hospital by the day before your surgery, call to confirm the date and time of your surgery. 12. Leave-time from work may vary depending on the type of surgery you have.  Appropriate arrangements should be made prior to surgery with your employer. 13. Prescriptions will be provided immediately following surgery by your doctor.  Have these filled as soon as possible after surgery and take the medication as directed. 14. Remove nail polish on the operative foot. 15. Wash the night before surgery.  The night before surgery wash the foot and leg well with the antibacterial soap provided and water paying special attention to beneath the toenails and in between the toes.  Rinse thoroughly with water and dry well with a towel.  Perform this wash unless told not to do so by your physician.  Enclosed: 1 Ice pack (please put in freezer the night before surgery)   1 Hibiclens skin cleaner   Pre-op Instructions  If you have any questions regarding the instructions, do not hesitate to call our office.  [place]: [address] [place], [place] [address] [phone]  [place]: [address] [place], [place] [address] [phone]  [place]: [address]  , [place] [address] [phone]  Dr. Richard   Tuchman DPM, Dr. Norman Regal DPM Dr. Richard Sikora DPM, Dr. M. Todd Hyatt DPM, Dr. Kathryn Egerton DPM, Dr. Tyreshia Ingman DPM 

## 2015-01-05 ENCOUNTER — Encounter: Payer: Self-pay | Admitting: Podiatry

## 2015-01-06 ENCOUNTER — Encounter: Payer: Self-pay | Admitting: *Deleted

## 2015-01-19 ENCOUNTER — Encounter: Payer: BLUE CROSS/BLUE SHIELD | Admitting: Podiatry

## 2015-01-27 ENCOUNTER — Ambulatory Visit: Payer: BLUE CROSS/BLUE SHIELD | Admitting: Podiatry

## 2015-02-01 ENCOUNTER — Telehealth: Payer: Self-pay | Admitting: *Deleted

## 2015-02-01 NOTE — Telephone Encounter (Signed)
"  Surgery scheduled for Wednesday.  I"m not sure where to go.  Could I get a call back."  I attempted to call patient.  I left a message with his girl friend that I was returning his call.  He can call me back on tomorrow.  "Alright, I'll tell him."

## 2015-02-03 ENCOUNTER — Encounter: Payer: Self-pay | Admitting: Podiatry

## 2015-02-03 DIAGNOSIS — M2012 Hallux valgus (acquired), left foot: Secondary | ICD-10-CM | POA: Diagnosis not present

## 2015-02-08 ENCOUNTER — Ambulatory Visit (INDEPENDENT_AMBULATORY_CARE_PROVIDER_SITE_OTHER): Payer: BLUE CROSS/BLUE SHIELD | Admitting: Podiatry

## 2015-02-08 ENCOUNTER — Ambulatory Visit: Payer: Self-pay

## 2015-02-08 ENCOUNTER — Encounter: Payer: Self-pay | Admitting: Neurology

## 2015-02-08 ENCOUNTER — Encounter: Payer: Self-pay | Admitting: Podiatry

## 2015-02-08 VITALS — BP 100/53 | HR 77 | Resp 15

## 2015-02-08 DIAGNOSIS — M205X2 Other deformities of toe(s) (acquired), left foot: Secondary | ICD-10-CM

## 2015-02-08 DIAGNOSIS — Z9889 Other specified postprocedural states: Secondary | ICD-10-CM

## 2015-02-08 NOTE — Telephone Encounter (Signed)
I called the pharmacy and spoke with Mellody Dance.  He said the patient picked up a 30 day Rx for Ultram on 07/07, so it is too soon to refill at this time.  On another note, Dr Ardelle Anton Prescribed Percocet 10/325 #40 on 07/13.

## 2015-02-09 ENCOUNTER — Encounter: Payer: Self-pay | Admitting: Podiatry

## 2015-02-09 NOTE — Progress Notes (Signed)
Patient ID: Jeremy [name], male   DOB: [birth year], 34 y.o.   MRN: [id number]  DOS: 02/03/15 s/p left tailors bunionectomy  Subjective: 34 year old male presents the office today one-week status post left tailors bunion correction. He states that overall he is to and wants pain is controlled. Continue with the Cam Walker. He's been taking antibiotic as directed. Denies any systemic complaints as fevers, chills, nausea, vomit. Denies any calf pain, chest pain, short of breath. No other complaints at this time no acute changes. Denies any numbness or tingling.  Objective: AAO 3, NAD Dressings are clean, dry, intact. Upon removal DP/PT pulses are palpable, CRT less than 3 seconds. Sensation appears to be intact. Incision on the dorsal lateral aspect of the left foot as well coapted without any evidence of dehiscence. There is mild edema along the area. There is no surrounding erythema, ascending cellulitis, fluctuance, crepitus, drainage, malodor. There is no significant tenderness palpation along the surgical site. No other areas of tenderness bilateral lower extremities. No other open lesions or pre-ulcerative lesions. There is no pain with calf compression, swelling, warmth, erythema.  Assessment: 34 year old male 1 week status post left tailors bunionectomy, doing well  Plan: -X-rays were obtained and reviewed with the patient.  -Treatment options discussed including all alternatives, risks, and complications -Antibiotic ointment was placed over the incision followed by dry sterile dressing. Keep his dressing clean, dry, intact. -Continue Cam Walker -Ice and elevation -Pain medication as needed -Monitor for any clinical signs or symptoms of infection and directed to call the office immediately should any occur or go to the ER. -Follow-up 1 week for suture removal  or sooner if any problems arise. In the meantime, encouraged to call the office with any questions, concerns, change in symptoms.    Ovid Curd, DPM

## 2015-02-15 ENCOUNTER — Other Ambulatory Visit: Payer: Self-pay | Admitting: General Surgery

## 2015-02-15 DIAGNOSIS — N62 Hypertrophy of breast: Secondary | ICD-10-CM

## 2015-02-16 NOTE — Telephone Encounter (Signed)
ERROR

## 2015-02-17 NOTE — Progress Notes (Signed)
DOS 02/03/2015 left foot surgical correction of tailors bunion with screw/wire fixation.

## 2015-02-19 ENCOUNTER — Ambulatory Visit (INDEPENDENT_AMBULATORY_CARE_PROVIDER_SITE_OTHER): Payer: BLUE CROSS/BLUE SHIELD | Admitting: Podiatry

## 2015-02-19 ENCOUNTER — Encounter: Payer: Self-pay | Admitting: Podiatry

## 2015-02-19 VITALS — BP 115/70 | HR 70 | Resp 12

## 2015-02-19 DIAGNOSIS — Z9889 Other specified postprocedural states: Secondary | ICD-10-CM

## 2015-02-19 DIAGNOSIS — M205X2 Other deformities of toe(s) (acquired), left foot: Secondary | ICD-10-CM

## 2015-02-19 MED ORDER — OXYCODONE-ACETAMINOPHEN 10-325 MG PO TABS: 1.0000 | ORAL_TABLET | Freq: Four times a day (QID) | ORAL | Status: DC | PRN

## 2015-02-19 MED ORDER — OXYCODONE-ACETAMINOPHEN 5-325 MG PO TABS: 1.0000 | ORAL_TABLET | Freq: Four times a day (QID) | ORAL | Status: DC | PRN

## 2015-02-23 ENCOUNTER — Other Ambulatory Visit: Payer: Self-pay | Admitting: Neurology

## 2015-02-23 DIAGNOSIS — R202 Paresthesia of skin: Secondary | ICD-10-CM

## 2015-02-23 MED ORDER — SILDENAFIL CITRATE 50 MG PO TABS: 100.0000 mg | ORAL_TABLET | Freq: Every day | ORAL | Status: DC | PRN

## 2015-02-23 MED ORDER — SILDENAFIL CITRATE 100 MG PO TABS: 100.0000 mg | ORAL_TABLET | Freq: Every day | ORAL | Status: DC | PRN

## 2015-02-23 MED ORDER — TRAMADOL HCL 50 MG PO TABS: 50.0000 mg | ORAL_TABLET | Freq: Three times a day (TID) | ORAL | Status: DC | PRN

## 2015-02-24 ENCOUNTER — Other Ambulatory Visit: Payer: Self-pay | Admitting: Neurology

## 2015-02-24 ENCOUNTER — Ambulatory Visit
Admission: RE | Admit: 2015-02-24 | Discharge: 2015-02-24 | Disposition: A | Discharge status: Home / Self Care | Payer: BLUE CROSS/BLUE SHIELD | Source: Ambulatory Visit | Attending: General Surgery | Admitting: General Surgery

## 2015-02-24 MED ORDER — ARMODAFINIL 250 MG PO TABS: 250.0000 mg | ORAL_TABLET | Freq: Every day | ORAL | Status: DC

## 2015-02-24 NOTE — Telephone Encounter (Signed)
Request entered, forwarded to provider for approval.  

## 2015-02-24 NOTE — Telephone Encounter (Signed)
Patient is calling to get a refill for Armodafinil (NUVIGIL) 250 MG tablet called to [facility]. I advised the patient to call the pharmacy for a refill  but he wanted to get the refill through our office. Thank you.

## 2015-02-25 ENCOUNTER — Telehealth: Payer: Self-pay | Admitting: *Deleted

## 2015-02-25 ENCOUNTER — Encounter: Payer: Self-pay | Admitting: Podiatry

## 2015-02-25 NOTE — Progress Notes (Signed)
Patient ID: Jeremy [name], male   DOB: [birth year], 34 y.o.   MRN: [id number]  DOS: 02/03/15 s/p left tailors bunionectomy  Subjective: 34 year old male presents the office today 2-weeks status post left tailors bunion correction. He states that overall he is doing well and his pain is controlled although he does continue to get some intermittent discomfort. He has continued with the Cam Walker. Denies any systemic complaints as fevers, chills, nausea, vomit. Denies any calf pain, chest pain, short of breath. No other complaints at this time no acute changes. Denies any numbness or tingling.  Objective: AAO 3, NAD Dressings are clean, dry, intact.  DP/PT pulses are palpable, CRT less than 3 seconds. Sensation appears to be intact. Incision on the dorsal lateral aspect of the left foot as well coapted without any evidence of dehiscence. There is mild edema along the area. There is no surrounding erythema, ascending cellulitis, fluctuance, crepitus, drainage, malodor. There is mild tenderness palpation along the surgical site. No other areas of tenderness bilateral lower extremities. No other open lesions or pre-ulcerative lesions. There is no pain with calf compression, swelling, warmth, erythema.  Assessment: 34 year old male 2 weeks status post left tailors bunionectomy, doing well  Plan: -Treatment options discussed including all alternatives, risks, and complications -Sutures were removed. Antibiotic ointment was placed over the incision followed by dry sterile dressing. Keep his dressing clean, dry, intact. Can remove in 24 hours and start to shower as long as the incision remains coapted. If there is any problems with the incision to hold off and call the office.  -Continue Cam Walker -Ice and elevation -Pain medication as needed -Monitor for any clinical signs or symptoms of infection and directed to call the office immediately should any occur or go to the ER. -Follow-up 2 week or sooner if  any problems arise. In the meantime, encouraged to call the office with any questions, concerns, change in symptoms.  -We went ahead and scheduled a date for surgery for the right foot. We need to do the consent closer to the date of surgery.  -X-ray left foot next appointment  Ovid Curd, DPM

## 2015-02-25 NOTE — Telephone Encounter (Signed)
Spoke w/ pt to let him know I am faxing Rx for Nivigil to his pharmacy. He verbalized understanding.

## 2015-03-03 ENCOUNTER — Other Ambulatory Visit: Payer: Self-pay | Admitting: General Surgery

## 2015-03-05 ENCOUNTER — Telehealth: Payer: Self-pay | Admitting: Podiatry

## 2015-03-05 MED ORDER — CEPHALEXIN 500 MG PO CAPS: 500.0000 mg | ORAL_CAPSULE | Freq: Three times a day (TID) | ORAL | Status: DC

## 2015-03-05 NOTE — Telephone Encounter (Signed)
Informed pt that Dr. Ardelle Anton was calling in [facility].

## 2015-03-05 NOTE — Telephone Encounter (Signed)
DR Prudence Davidson SENT TO ME

## 2015-03-05 NOTE — Telephone Encounter (Signed)
PT CALLED STATING HE THINKS THE SURGICAL SITE IS INFECTED, RED, INFLAMMED AND PUS COMING OUT AND I OFFERED HIM APPT TODAY AND HE WAS NOT ABLE TO COME IN. HE WAS ALREADY SCHEDULED TO BE SEEN ON 8.15 AND WILL KEEP THIS APPT. DR Ardelle Anton SAID HE WOULD CALL IN ANTIBIOTIC.[facility] [address].

## 2015-03-05 NOTE — Telephone Encounter (Signed)
Please do keflex 500mg  TID x 10 days.

## 2015-03-08 ENCOUNTER — Ambulatory Visit (INDEPENDENT_AMBULATORY_CARE_PROVIDER_SITE_OTHER): Payer: BLUE CROSS/BLUE SHIELD

## 2015-03-08 ENCOUNTER — Encounter: Payer: Self-pay | Admitting: Podiatry

## 2015-03-08 ENCOUNTER — Ambulatory Visit (INDEPENDENT_AMBULATORY_CARE_PROVIDER_SITE_OTHER): Payer: BLUE CROSS/BLUE SHIELD | Admitting: Podiatry

## 2015-03-08 VITALS — BP 102/49 | HR 64 | Resp 17

## 2015-03-08 DIAGNOSIS — M21969 Unspecified acquired deformity of unspecified lower leg: Secondary | ICD-10-CM

## 2015-03-08 DIAGNOSIS — M205X1 Other deformities of toe(s) (acquired), right foot: Secondary | ICD-10-CM

## 2015-03-08 DIAGNOSIS — M205X2 Other deformities of toe(s) (acquired), left foot: Secondary | ICD-10-CM | POA: Diagnosis not present

## 2015-03-08 NOTE — Progress Notes (Signed)
Patient ID: Jeremy [name], male   DOB: 07-Feb-1981, 34 y.o.   MRN: [id number]  DOS: 02/03/15 s/p left tailors bunionectomy  Subjective: 34 year old male presents the office today  status post left tailors bunion correction. He states that last Friday he notices a lot of pus coming from the end of the incision. He was also, the office that times are calling Flex that she's been taken since Friday he has noticed improvement. He denies any positive last day. He also states he had some redness on the incision which is resolved. He also states that since last appointment he did fall getting into bed injury and his foot he was concerned that he broke the foot over the surgical site. He was not wearing his Cam Walker. Denies any systemic complaints as fevers, chills, nausea, vomit. Denies any calf pain, chest pain, short of breath. No other complaints at this time no acute changes. Denies any numbness or tingling.  Objective: AAO 3, NAD Dressings are clean, dry, intact.  DP/PT pulses are palpable, CRT less than 3 seconds. Sensation appears to be intact. Incision on the dorsal lateral aspect of the left foot as well coapted without any evidence of dehiscence and a scab has formed except for the proximal portion. There is a small scab on the possible cortical incision. Upon debridement there is no purulence expressed although a suture was removed. Subjectively this is where he states that he did have a small amount of pus. There is none expressed today. There is mild edema along the area. There is no surrounding erythema, ascending cellulitis, fluctuance, crepitus, drainage, malodor. There is mild tenderness palpation along the surgical site. There is mild edema over surgical site as well. There is also prominent tailor's bunion on the right foot with irritation along the lateral aspect of the fifth metatarsal head from irritation. No other areas of tenderness bilateral lower extremities. No other open lesions or  pre-ulcerative lesions. There is no pain with calf compression, swelling, warmth, erythema.  Assessment: 34 year old male status post left tailors bunionectomy; right foot tailor's bunion  Plan: -X-rays were obtained and reviewed with the patient.  The osteotomy appears to have shifted and the screw is no longer holding the appropriate position. I discussed this with the patient. It appears to be stable and clinically has a good results will heal in this position. I did discuss hardware removal with the patient. -Treatment options discussed including all alternatives, risks, and complications -There is a small scab along the proximal aspect of the incision. Upon debridement there is no 6 press although a suture was removed. A dressing was applied. He has had a shower in 24 hours along the areas closed and there is no signs of infection. -Finish course of antibiotics. -Continue Cam Walker -Ice and elevation -Pain medication as needed -Monitor for any clinical signs or symptoms of infection and directed to call the office immediately should any occur or go to the ER. -Stressed surgical intervention for right foot tailor's bunion. Etiology has a date set. He'll let to go ahead and pursue surgical intervention for the right tailor's bunion. I discussed with him fifth metatarsal osteotomy with hardware. At that time we'll also likely remove the hardware for the left foot.The incision placement as well as the postoperative course was discussed with the patient. I discussed risks of the surgery which include, but not limited to, infection, bleeding, pain, swelling, need for further surgery, delayed or nonhealing, painful or ugly scar, numbness or sensation changes, over/under  correction, recurrence, transfer lesions, further deformity, hardware failure, DVT/PE, loss of toe/foot. Patient understands these risks and wishes to proceed with surgery. The surgical consent was reviewed with the patient all 3 pages  were signed. No promises or guarantees were given to the outcome of the procedure. All questions were answered to the best of my ability. Before the surgery the patient was encouraged to call the office if there is any further questions. The surgery will be performed at the [facility] on an outpatient basis. -Follow-up 2 week or sooner if any problems arise. In the meantime, encouraged to call the office with any questions, concerns, change in symptoms.   -X-ray left foot next appointment  Ovid Curd, DPM

## 2015-03-22 ENCOUNTER — Ambulatory Visit (INDEPENDENT_AMBULATORY_CARE_PROVIDER_SITE_OTHER): Payer: BLUE CROSS/BLUE SHIELD

## 2015-03-22 ENCOUNTER — Encounter: Payer: Self-pay | Admitting: Podiatry

## 2015-03-22 ENCOUNTER — Ambulatory Visit (INDEPENDENT_AMBULATORY_CARE_PROVIDER_SITE_OTHER): Payer: BLUE CROSS/BLUE SHIELD | Admitting: Podiatry

## 2015-03-22 VITALS — BP 99/64 | HR 69 | Resp 18

## 2015-03-22 DIAGNOSIS — Z9889 Other specified postprocedural states: Secondary | ICD-10-CM

## 2015-03-22 DIAGNOSIS — M205X2 Other deformities of toe(s) (acquired), left foot: Secondary | ICD-10-CM | POA: Diagnosis not present

## 2015-03-22 NOTE — Progress Notes (Signed)
Patient ID: Jeremy [name], male   DOB: 1980-08-10, 34 y.o.   MRN: [id number]  Subjective: 34 year old male presents for evaluation of left foot tailor's bunionectomy. He states that before last appointment he did fall injuring his foot. He states there is concern that he broke the foot from the fall due to the impact. After last appointment I discussed the osteotomy had shifted and the screw is loose. He states that he can feel a screw removal around in his foot. He states that overall foot looks good in the position it is. He states that he gets some occasional discomfort however at improving. He has some intermittent swelling as he is on his feet more. Denies any redness. States the incision remains closed been no problems. He is scheduled to have right foot surgically due to September.  Objective: AAO 3, NAD Neurovascular status unchanged and intact Incision the dorsal lateral aspect of the left foot overlying the tailor's bunion is well-healed scar. There is mild tenderness to palpation over the fifth metatarsal head. There is no overlying erythema, increase in warmth. There is mild edema to the area. There is no pain with fifth MTPJ range of motion. There is tenderness over the tailor's bunion on the right foot with continued tenderness overlying the area. No open lesions or pre-ulcerative lesions. No other areas of tenderness bilateral lower extremity's. No other areas of edema, erythema, increase in warmth. No pain with calf compression, swelling, warmth, erythema.  Assessment: 34 year old male with healing fifth metatarsal osteotomy which was slightly displaced due to patient fall  Plan: -X-rays were obtained and reviewed with the patient.  -Treatment options discussed including all alternatives, risks, and complications -Continuing the cam walker or surgical shoe. If he is doing a lot of walking to remain in the boot but is at his house can use a surgical shoe. -A scheduled have surgery  for the right foot tailor's bunion in the middle of September and we will also remove the screw on the left side. He is no further questions and the surgical consent was are to be viewed. -Follow-up the Monday before his surgery to get an x-ray of the left foot prior to hardware removal. In the meantime I encouraged him to call the office with any questions, concerns, change in symptoms.  Ovid Curd, DPM

## 2015-04-05 ENCOUNTER — Ambulatory Visit (INDEPENDENT_AMBULATORY_CARE_PROVIDER_SITE_OTHER): Payer: BLUE CROSS/BLUE SHIELD

## 2015-04-05 ENCOUNTER — Ambulatory Visit (INDEPENDENT_AMBULATORY_CARE_PROVIDER_SITE_OTHER): Payer: BLUE CROSS/BLUE SHIELD | Admitting: Podiatry

## 2015-04-05 ENCOUNTER — Encounter: Payer: Self-pay | Admitting: Podiatry

## 2015-04-05 ENCOUNTER — Ambulatory Visit: Payer: BLUE CROSS/BLUE SHIELD

## 2015-04-05 VITALS — BP 113/62 | HR 80 | Resp 18

## 2015-04-05 DIAGNOSIS — R52 Pain, unspecified: Secondary | ICD-10-CM

## 2015-04-05 DIAGNOSIS — Z9889 Other specified postprocedural states: Secondary | ICD-10-CM

## 2015-04-05 MED ORDER — OXYCODONE-ACETAMINOPHEN 10-325 MG PO TABS: 1.0000 | ORAL_TABLET | Freq: Four times a day (QID) | ORAL | Status: DC | PRN

## 2015-04-05 MED ORDER — CEPHALEXIN 500 MG PO CAPS: 500.0000 mg | ORAL_CAPSULE | Freq: Three times a day (TID) | ORAL | Status: DC

## 2015-04-05 MED ORDER — PROMETHAZINE HCL 25 MG PO TABS: 25.0000 mg | ORAL_TABLET | Freq: Three times a day (TID) | ORAL | Status: DC | PRN

## 2015-04-05 MED ORDER — OXYCODONE-ACETAMINOPHEN 5-325 MG PO TABS: 1.0000 | ORAL_TABLET | ORAL | Status: DC | PRN

## 2015-04-05 NOTE — Progress Notes (Signed)
Patient ID: Jeremy [name], male   DOB: 1981/04/22, 34 y.o.   MRN: [id number]  Subjective: 34 year old male presents the office they for follow-up evaluation says post left foot tailor's bunionectomy. He displacement osteotomy due to injury and he states he can feel a screw. He has continued to wear the CAM boot. Clinically he states that surgery looks good and he is happy with the position. He does continue to have some occasional discomfort overlying the surgical site. He gets some intermittent swelling as he is on his feet more denies any redness or drainage. His pain is currently controlled and not requiring pain medication often. He continues have pain in his right foot over on the prominent tailor's bunion. He denies any systemic complaints as fevers, chills, nausea, vomiting. Denies any calf pain, chest pain, shortness of breath. No other recent injury.  Objective: AAO 3, NAD Neurovascular status intact and unchanged Incision the dorsal lateral aspect left foot as well coapted and the scar is well formed. There is mild to palpation along the surgical site and there is trace edema overlying the area. There is no associated erythema or increase in warmth. There is no drainage or purulence. No clinical signs of infection this time. The hardware is not prominent at this time however there is tenderness overlying the area of the hardware. There is continuation of prominent tailor's bunion the right foot with rotation over the fifth metatarsal head laterally due to irritation. No other areas of tenderness. Other open lesions or pre-ulcer lesions. No pain with calf compression, swelling, warmth, erythema.  Assessment: 34 year old male with displacement the osteotomy due to injury left foot with painful hardware, right tailor's bunion  Plan: -X-rays were obtained and reviewed with the patient. There continues be a radiolucent line within the fifth metatarsal neck. The hardware is in the lateral aspect of  the bone soft tissue. -Treatment options discussed including all alternatives, risks, and complications -I discussed his upcoming surgery with him on Wednesday. His girlfriend was also questioning whether he should go ahead and pursue the other foot on the right side as plan continue soaking his pain on the left side and the hardware is causing pain. After long discussion with the patient he has elected to hold off on surgery to the right foot on Wednesday however he elected to go ahead and continue to proceed with hardware removal to the left side as he states he can feel the screw removed within his foot. -Continue a surgical shoe was dispensed to him today. -Ice and elevation -Pain medication as needed -Will proceed with surgery to the left foot hardware removal on Wednesday and hold off on tailor's bunion of the right foot until October once the left foot heels. -Follow-up on when to the surgery center sooner if any problems are to arise. In the meantime call the office with any questions, concerns, change in symptoms.  Jillyn Ledger, DPM

## 2015-04-07 ENCOUNTER — Encounter: Payer: Self-pay | Admitting: *Deleted

## 2015-04-07 DIAGNOSIS — Z4889 Encounter for other specified surgical aftercare: Secondary | ICD-10-CM

## 2015-04-12 ENCOUNTER — Encounter: Payer: Self-pay | Admitting: Podiatry

## 2015-04-12 ENCOUNTER — Ambulatory Visit (INDEPENDENT_AMBULATORY_CARE_PROVIDER_SITE_OTHER): Payer: BLUE CROSS/BLUE SHIELD | Admitting: Podiatry

## 2015-04-12 ENCOUNTER — Ambulatory Visit (INDEPENDENT_AMBULATORY_CARE_PROVIDER_SITE_OTHER): Payer: BLUE CROSS/BLUE SHIELD

## 2015-04-12 VITALS — BP 108/65 | HR 76 | Resp 18

## 2015-04-12 DIAGNOSIS — Z9889 Other specified postprocedural states: Secondary | ICD-10-CM

## 2015-04-12 DIAGNOSIS — M205X1 Other deformities of toe(s) (acquired), right foot: Secondary | ICD-10-CM

## 2015-04-12 NOTE — Progress Notes (Signed)
Patient ID: Jeremy [name], male   DOB: 04-20-1981, 34 y.o.   MRN: [id number]  DOS: 04/07/15 s/p Left HWR  Subjective: 34 year old male presents the office today one week status post left fifth metatarsal hardware removal. He states that since last appointment his pain is controlled and he feels better compared to prior to surgery. He has changed the bandage since last appointment. He denies any swelling, redness or any drainage from the incision. He also that the ahead and reschedule surgery for the right side on the tailor's bunion. No other complaints at this time in no acute changes.  Objective: AAO 3, NAD Neurovascular status intact and unchanged Incision on the dorsal lateral aspect the left foot as well coapted without any evidence of dehiscence and sutures are intact. There is minimal upon edema without any associated erythema or increase in warmth. There is no drainage or purulence expressed. There is no tenderness to palpation overlying the surgical site at this time. No pain with MTPJ range of motion. There is continued tenderness on the lateral aspect of the right fifth metatarsal head and along the tailor's bunion deformity which is prominent. No open lesions or pre-ulcer lesions identified bilaterally. There is no pain with calf compression, swelling, warmth, erythema.  Assessment: 34 year old male 1 week status post left hardware removal, right tailor's bunion symptomatic  Plan : -X-rays were obtained and reviewed with the patient.  -Treatment options discussed including all alternatives, risks, and complications -Antibiotic ointment is placed over the incision followed by dry sterile dressing. Keep the dressing clean, dry, intact. Continue surgical shoe at all times. Ice and elevation. Pain medication as needed.  Monitor for any clinical signs or symptoms of infection and directed to call the office immediately should any occur or go to the ER. -At today's appointment I again did the  consent with him for the right side. He previously saw on the consent however since we deferred for the surgery to later appointment the consent was redone. The incision placement as well as the postoperative course was discussed with the patient. I discussed risks of the surgery which include, but not limited to, infection, bleeding, pain, swelling, need for further surgery, delayed or nonhealing, painful or ugly scar, numbness or sensation changes, over/under correction, recurrence, transfer lesions, further deformity, hardware failure, DVT/PE, loss of toe/foot. Patient understands these risks and wishes to proceed with surgery. The surgical consent was reviewed with the patient all 3 pages were signed. No promises or guarantees were given to the outcome of the procedure. All questions were answered to the best of my ability. Before the surgery the patient was encouraged to call the office if there is any further questions. The surgery will be performed at the [facility] on an outpatient basis. -Follow-up next week for likely suture removal or sooner if any problems arise. In the meantime, encouraged to call the office with any questions, concerns, change in symptoms.  *x-ray left foot next appointment    Ovid Curd, DPM

## 2015-04-14 ENCOUNTER — Ambulatory Visit: Payer: BLUE CROSS/BLUE SHIELD | Admitting: Neurology

## 2015-04-14 NOTE — Progress Notes (Signed)
Surgery at [facility] for Tailors Bunionectomy right foot and Removal Fixation Deep Kwire/ Screw left foot.

## 2015-04-15 ENCOUNTER — Ambulatory Visit: Payer: BLUE CROSS/BLUE SHIELD | Admitting: Neurology

## 2015-04-20 ENCOUNTER — Ambulatory Visit (INDEPENDENT_AMBULATORY_CARE_PROVIDER_SITE_OTHER): Payer: BLUE CROSS/BLUE SHIELD | Admitting: Neurology

## 2015-04-20 ENCOUNTER — Encounter: Payer: Self-pay | Admitting: Neurology

## 2015-04-20 VITALS — BP 106/74 | HR 62 | Resp 12 | Ht 66.0 in | Wt 162.0 lb

## 2015-04-20 DIAGNOSIS — R260 Ataxic gait: Secondary | ICD-10-CM | POA: Diagnosis not present

## 2015-04-20 DIAGNOSIS — R5383 Other fatigue: Secondary | ICD-10-CM

## 2015-04-20 DIAGNOSIS — N521 Erectile dysfunction due to diseases classified elsewhere: Secondary | ICD-10-CM | POA: Diagnosis not present

## 2015-04-20 DIAGNOSIS — G4726 Circadian rhythm sleep disorder, shift work type: Secondary | ICD-10-CM

## 2015-04-20 DIAGNOSIS — E291 Testicular hypofunction: Secondary | ICD-10-CM | POA: Diagnosis not present

## 2015-04-20 DIAGNOSIS — R202 Paresthesia of skin: Secondary | ICD-10-CM | POA: Diagnosis not present

## 2015-04-20 DIAGNOSIS — G5622 Lesion of ulnar nerve, left upper limb: Secondary | ICD-10-CM

## 2015-04-20 DIAGNOSIS — Z79899 Other long term (current) drug therapy: Secondary | ICD-10-CM

## 2015-04-20 DIAGNOSIS — F418 Other specified anxiety disorders: Secondary | ICD-10-CM | POA: Diagnosis not present

## 2015-04-20 DIAGNOSIS — G35 Multiple sclerosis: Secondary | ICD-10-CM | POA: Diagnosis not present

## 2015-04-20 DIAGNOSIS — R7989 Other specified abnormal findings of blood chemistry: Secondary | ICD-10-CM

## 2015-04-20 MED ORDER — TADALAFIL 20 MG PO TABS: 20.0000 mg | ORAL_TABLET | Freq: Every day | ORAL | Status: DC | PRN

## 2015-04-20 MED ORDER — TRAMADOL HCL 50 MG PO TABS: 50.0000 mg | ORAL_TABLET | Freq: Three times a day (TID) | ORAL | Status: DC | PRN

## 2015-04-20 NOTE — Progress Notes (Signed)
[facility]  PATIENT: Jeremy [name] DOB: [birth year]  REFERRING CLINICIAN: Burton Apley  HISTORY FROM: patient REASON FOR VISIT: MS, needs to change therapy   HISTORICAL  CHIEF COMPLAINT:  Chief Complaint  Patient presents with  . Multiple Sclerosis    Sts. he tolerates Gilenya well.  Sts. he feels memory may be some worse.  Sts. last night he had an episode of slurred speech that lasted only seconds.  Denies h/a.  He is scheduled to have surgery for ulnar neuropathy (left) on 05-26-15./fim    HISTORY OF PRESENT ILLNESS:   Jeremy [name] is a 34 year old man with multiple sclerosis.   He is on Gilenya and tolerates it well.   He has not had any recent exacerbations  MS History:   diagnosed in January 2012.   In 2009, he had a several week episode of right hand clumsiness associated with some electric shock sensations. He did not see a doctor about the symptoms. In December 2011, he had the onset of altered taste. A week later, he had nausea and dizziness and over the next couple days these symptoms worsened and he developed right-sided clumsiness and slurred speech. First toward his PCP who felt he might have an ear infection and he was referred to ENT. ENT evaluated him and referred him to Dr. Anne Hahn at [facility] who noted an ataxic gait and ordered an MRI of the spine and brain. Those studies were consistent with multiple sclerosis and he underwent a lumbar puncture in April 2011 that was also consistent with MS. He was started on Rebif in June 2011 and remain on the medicine until September 2014 when a repeat MRI showed the interval development of several more lesions. He was switched to [place] but insurance would no longer cover it so we switched to Gilenya early 2016.     Gait/strength/sensation:  He reports a mild gait disturbance with ataxia but no weakness in his legs.  However, he has not fallen.   He has some numbness in his limbs, worse in left arm.     Vertigo:  He has had some vertigo spells but these are less than earlier in year.     decreased memory, bladder dysfunction and erectile dysfunction.  Bladder:  He has mild urinary frequency but this is not bad enough to treat. He has 0-1 nocturia.   He was also having some erectile dysfunction. However, testosterone was found to be low normal. Insurance is not covering that at this point.   Cialis helped much better than Viagra.       Farigue/sleep:   He has fatigue most days, physical mor ethan mental. He notes that he works third shift and the transition can be difficult at times. He usually goes to bed at 5 or 6 in the mornings. However, on days he has off, he will often go to bed at 1 in the morning to try to keep a schedule more like those around him.    He often feels tired at work.  Mood:   He has had some depression and was placed on Prozac with benefit.    He has anxiety in closed spaces and with shaving.   He notes mood wswings    MRIs from 05/25/2014 show a moderate size focus in the left temporal lobe and another 10 fairly small periventricular and deep white matter foci. MRI of the cervical spine showed 2 foci, one at C4 and one at C6. There were no enhancing  lesions on those MRIs.  REVIEW OF SYSTEMS:  Constitutional: No fevers, chills, sweats, or change in appetite Eyes: No visual changes, double vision, eye pain Ear, nose and throat: No hearing loss, ear pain, nasal congestion, sore throat Cardiovascular: No chest pain, palpitations Respiratory:  No shortness of breath at rest or with exertion.   No wheezes GastrointestinaI: No nausea, vomiting, diarrhea, abdominal pain, fecal incontinence Genitourinary:  No dysuria, urinary retention or frequency.  No nocturia. Musculoskeletal:  No neck pain, back pain Integumentary: No rash, pruritus, skin lesions Neurological: as above Psychiatric: No depression at this time.  No anxiety Endocrine: No palpitations, diaphoresis, change in  appetite, change in weigh or increased thirst Hematologic/Lymphatic:  No anemia, purpura, petechiae. Allergic/Immunologic: No itchy/runny eyes, nasal congestion, recent allergic reactions, rashes  ALLERGIES: Allergies  Allergen Reactions  . Sulfa Antibiotics Nausea Only    HOME MEDICATIONS: Outpatient Prescriptions Prior to Visit  Medication Sig Dispense Refill  . ALPRAZolam (XANAX) 1 MG tablet Take 1 mg by mouth at bedtime as needed for sleep (take 2 tabs as needed at night).    Marland Kitchen amphetamine-dextroamphetamine (ADDERALL XR) 30 MG 24 hr capsule Take 30 mg by mouth daily.  0  . Armodafinil (NUVIGIL) 250 MG tablet Take 1 tablet (250 mg total) by mouth daily. 30 tablet 5  . cephALEXin (KEFLEX) 500 MG capsule TAKE ONE CAPSULE BY MOUTH 3 TIMES A DAY FOR 7 DAYS  0  . Fingolimod HCl 0.5 MG CAPS Take 1 capsule (0.5 mg total) by mouth daily. 90 capsule 3  . FLUoxetine (PROZAC) 20 MG capsule Take 20 mg by mouth daily.  3  . HYDROcodone-acetaminophen (NORCO) 7.5-325 MG per tablet as needed.   0  . ibuprofen (ADVIL,MOTRIN) 800 MG tablet Take 800 mg by mouth every 6 (six) hours as needed.     Marland Kitchen levothyroxine (SYNTHROID, LEVOTHROID) 50 MCG tablet Take 50 mcg by mouth daily.  4  . Multiple Vitamin (MULTIVITAMIN WITH MINERALS) TABS tablet Take 1 tablet by mouth daily.    . simvastatin (ZOCOR) 20 MG tablet Take 20 mg by mouth daily.  4  . traMADol (ULTRAM) 50 MG tablet Take 1 tablet (50 mg total) by mouth 3 (three) times daily as needed. 90 tablet 3  . tadalafil (CIALIS) 20 MG tablet Take 1 tablet (20 mg total) by mouth daily as needed for erectile dysfunction. (Patient not taking: Reported on 04/20/2015) 10 tablet 11  . cephALEXin (KEFLEX) 250 MG capsule TAKE ONE CAPSULE BY MOUTH 4 TIMES A DAY FOR 10 DAYS  0  . cephALEXin (KEFLEX) 500 MG capsule Take 500 mg by mouth 3 (three) times daily.    . cephALEXin (KEFLEX) 500 MG capsule Take 1 capsule (500 mg total) by mouth 3 (three) times daily. (Patient not  taking: Reported on 04/20/2015) 30 capsule 0  . cephALEXin (KEFLEX) 500 MG capsule Take 1 capsule (500 mg total) by mouth 3 (three) times daily. (Patient not taking: Reported on 04/20/2015) 30 capsule 2  . gabapentin (NEURONTIN) 300 MG capsule One po in am, one po in evening and two at bedtime (Patient not taking: Reported on 04/20/2015) 120 capsule 5  . methylPREDNISolone (MEDROL DOSEPAK) 4 MG TBPK tablet Take as dirested (Patient not taking: Reported on 04/20/2015) 21 tablet 0  . oxyCODONE-acetaminophen (PERCOCET) 10-325 MG per tablet Take 1 tablet by mouth every 6 (six) hours as needed for pain. (Patient not taking: Reported on 04/20/2015) 30 tablet 0  . oxyCODONE-acetaminophen (ROXICET) 5-325 MG per tablet Take  1-2 tablets by mouth every 6 (six) hours as needed for severe pain. (Patient not taking: Reported on 04/20/2015) 40 tablet 0  . oxyCODONE-acetaminophen (ROXICET) 5-325 MG per tablet Take 1 tablet by mouth every 4 (four) hours as needed for severe pain. (Patient not taking: Reported on 04/20/2015) 40 tablet 0  . promethazine (PHENERGAN) 25 MG tablet TAKE 1 TABLET BY MOUTH EVERY 6 HOURS AS NEEDED FOR NAUSEA AND VOMITING  0  . promethazine (PHENERGAN) 25 MG tablet Take 25 mg by mouth every 6 (six) hours as needed for nausea or vomiting. 1 tablet every 4-6 hours prn nausea.    . promethazine (PHENERGAN) 25 MG tablet Take 1 tablet (25 mg total) by mouth every 8 (eight) hours as needed for nausea or vomiting. (Patient not taking: Reported on 04/20/2015) 30 tablet 0  . sildenafil (VIAGRA) 100 MG tablet Take 1 tablet (100 mg total) by mouth daily as needed for erectile dysfunction. Please fill the 100 mg tablets and cancel the 50 mg prescription (Patient not taking: Reported on 04/20/2015) 10 tablet 5   No facility-administered medications prior to visit.    PAST MEDICAL HISTORY: Past Medical History  Diagnosis Date  . Hx of dizziness   . Gait disturbance   . Hypothyroidism   . Depression   .  Dyslipidemia   . Back pain, chronic     low  . MS (multiple sclerosis)     probable  . Testosterone deficiency   . Blood transfusion without reported diagnosis   . PONV (postoperative nausea and vomiting)     Nausea  . Anxiety   . Gynecomastia, male   . Snoring     "mild" sleep apnea. Pt was not required to wear CPAP machine, instead he uses nasal spray  . Vision abnormalities     PAST SURGICAL HISTORY: Past Surgical History  Procedure Laterality Date  . Gynecomastia excision      X 2  . Gynecomastia mastectomy Bilateral 03/09/2014    Procedure: BILATERAL EXCISION GYNECOMASTIA;  Surgeon: Robyne Askew, MD;  Location: [facility] OR;  Service: General;  Laterality: Bilateral;    FAMILY HISTORY: Family History  Problem Relation Age of Onset  . Cancer Mother   . Cancer Father   . Hypertension Father   . Stroke      Grandmother  . Hypertension Brother     SOCIAL HISTORY:  Social History   Social History  . Marital Status: Single    Spouse Name: N/A  . Number of Children: 0  . Years of Education: College    Occupational History  . Chef     Social History Main Topics  . Smoking status: Former Games developer  . Smokeless tobacco: Never Used  . Alcohol Use: 0.0 oz/week    0 Standard drinks or equivalent per week     Comment: social  . Drug Use: No  . Sexual Activity: Not on file   Other Topics Concern  . 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    Patient has a 4 year college education.    Patient has no children.            PHYSICAL EXAM  Filed Vitals:   04/20/15 1215  BP: 106/74  Pulse: 62  Resp: 12  Height:  (1.676 m)  Weight: 162 lb (73.483 kg)    Body mass index is 26.16 kg/(m^2).   General: The patient is  well-developed and well-nourished and in no acute distress  Skin: Extremities are without significant edema.  Neurologic Exam  Mental status: The patient is alert and oriented x 3 at the time of the  examination. The patient has apparent normal recent and remote memory, with an apparently normal attention span and concentration ability.   Speech is normal.  Cranial nerves: Extraocular movements are full.  There is good facial sensation to soft touch bilaterally.Facial strength is normal.  Trapezius and sternocleidomastoid strength is normal. No dysarthria is noted.   No obvious hearing deficits are noted.  Motor:  Muscle bulk and tone are normal. Strength is  5 / 5 in all 4 extremities.   Sensory: Sensory testing is intact to pinprick, soft touch, vibration sensation, and position sense on all 4 extremities.  Coordination: Cerebellar testing reveals mildly reduced right finger-nose-finger and heel-to-shin.  Gait and station: Station and gait are normal. Tandem gait is wide. Romberg is negative.   Reflexes: Deep tendon reflexes are increased in legs with spread at both knees.        DIAGNOSTIC DATA (LABS, IMAGING, TESTING) - I reviewed patient records, labs, notes, testing and imaging myself where available.  Lab Results  Component Value Date   WBC 9.3 12/09/2014   HGB 14.9 03/02/2014   HCT 41.6 12/09/2014   MCV 92.3 03/02/2014   PLT 169 03/02/2014      Component Value Date/Time   NA 142 03/02/2014 1342   K 4.9 03/02/2014 1342   CL 104 03/02/2014 1342   CO2 26 03/02/2014 1342   GLUCOSE 82 03/02/2014 1342   BUN 15 03/02/2014 1342   CREATININE 0.86 03/02/2014 1342   CALCIUM 9.6 03/02/2014 1342   GFRNONAA >90 03/02/2014 1342   GFRAA >90 03/02/2014 1342      ASSESSMENT AND PLAN  Paresthesia - Plan: MR Brain W Wo Contrast, traMADol (ULTRAM) 50 MG tablet  Multiple sclerosis - Plan: MR Brain W Wo Contrast, CBC with Differential/Platelet, Hepatic Function Panel  Other fatigue - Plan: Testosterone  High risk medication use - Plan: CBC with Differential/Platelet, Hepatic Function Panel  Ataxic gait  Erectile disorder due to medical condition in male patient - Plan:  Testosterone  Ulnar nerve entrapment, left  Depression with anxiety  Shift work sleep disorder  Low testosterone - Plan: Testosterone   1.   Continue Gilenya. I will check some lab work today to make sure he is not having toxicity. 2.   Renewed tramadol for his nerve related pain. 3.   Cialis for ED. I will also check testosterone and as it has been low in the past. He may require supplementation. We discussed stopping the fluoxetine, though he feels it has been beneficial so we'll hold off at this point. 4.   He will continue to be active and exercises as tolerated. Return in 4 months or sooner if there are new or worsening neurologic symptoms.  Richard A. Epimenio Foot, MD, PhD 04/20/2015, 12:40 PM Certified in Neurology, Clinical Neurophysiology, Sleep Medicine, Pain Medicine and Neuroimaging  [facility] [address] [place], [place] [address] [phone]

## 2015-04-21 LAB — HEPATIC FUNCTION PANEL
ALBUMIN: 4.8 g/dL (ref 3.5–5.5)
ALK PHOS: 80 IU/L (ref 39–117)
ALT: 42 IU/L (ref 0–44)
AST: 23 IU/L (ref 0–40)
BILIRUBIN TOTAL: 0.6 mg/dL (ref 0.0–1.2)
BILIRUBIN, DIRECT: 0.17 mg/dL (ref 0.00–0.40)
TOTAL PROTEIN: 7.3 g/dL (ref 6.0–8.5)

## 2015-04-21 LAB — CBC WITH DIFFERENTIAL/PLATELET
BASOS: 1 %
Basophils Absolute: 0 10*3/uL (ref 0.0–0.2)
EOS (ABSOLUTE): 0.1 10*3/uL (ref 0.0–0.4)
EOS: 1 %
HEMOGLOBIN: 15.5 g/dL (ref 12.6–17.7)
Hematocrit: 45.6 % (ref 37.5–51.0)
IMMATURE GRANS (ABS): 0 10*3/uL (ref 0.0–0.1)
Immature Granulocytes: 1 %
LYMPHS ABS: 0.4 10*3/uL — AB (ref 0.7–3.1)
Lymphs: 9 %
MCH: 31.1 pg (ref 26.6–33.0)
MCHC: 34 g/dL (ref 31.5–35.7)
MCV: 91 fL (ref 79–97)
MONOS ABS: 0.6 10*3/uL (ref 0.1–0.9)
Monocytes: 14 %
NEUTROS ABS: 3.3 10*3/uL (ref 1.4–7.0)
Neutrophils: 74 %
PLATELETS: 193 10*3/uL (ref 150–379)
RBC: 4.99 x10E6/uL (ref 4.14–5.80)
RDW: 13.3 % (ref 12.3–15.4)
WBC: 4.3 10*3/uL (ref 3.4–10.8)

## 2015-04-21 LAB — TESTOSTERONE: TESTOSTERONE: 430 ng/dL (ref 348–1197)

## 2015-04-22 ENCOUNTER — Telehealth: Payer: Self-pay | Admitting: *Deleted

## 2015-04-22 NOTE — Telephone Encounter (Signed)
I have spoken with Jeremy [name] this am and, per RAS, have advised that lft's and cbc are ok; he should continue Gilenya as rx'd. I have also advised that testosterone is low normal.  He verbalized understanding of same, would like to know if RAS can give something to boost testosterone.  I have advised that RAS is out of the office until Monday--I will check with him next week and call him back.  Jeremy [name] is agreeable with this plan/fim

## 2015-04-22 NOTE — Telephone Encounter (Signed)
-----   Message from Asa Lente, MD sent at 04/21/2015  7:10 PM EDT ----- Please also note that the blood count and liver tests were fine for Gilenya.    His testosterone was in the normal range though in the lower end of the expected range.

## 2015-04-23 ENCOUNTER — Ambulatory Visit (INDEPENDENT_AMBULATORY_CARE_PROVIDER_SITE_OTHER): Payer: BLUE CROSS/BLUE SHIELD | Admitting: Podiatry

## 2015-04-23 DIAGNOSIS — Z9889 Other specified postprocedural states: Secondary | ICD-10-CM

## 2015-04-23 DIAGNOSIS — M205X2 Other deformities of toe(s) (acquired), left foot: Secondary | ICD-10-CM

## 2015-04-23 DIAGNOSIS — Z472 Encounter for removal of internal fixation device: Secondary | ICD-10-CM

## 2015-04-26 ENCOUNTER — Telehealth: Payer: Self-pay | Admitting: *Deleted

## 2015-04-26 NOTE — Telephone Encounter (Signed)
-----   Message from Richard A Sater, MD sent at 04/21/2015  7:10 PM EDT ----- Please also note that the blood count and liver tests were fine for Gilenya.    His testosterone was in the normal range though in the lower end of the expected range. 

## 2015-04-26 NOTE — Telephone Encounter (Signed)
I have spoken with Jeremy [name] this morning and per RAS, advised that he should f/u with pcp to discuss tx. options for boosting testosterone.  Again, level was normal, just low end of normal (430, and normal is [phone]).  Jeremy [name] verbalized understanding of same/fim

## 2015-04-26 NOTE — Progress Notes (Signed)
Patient ID: Jeremy [name], male   DOB: 1980-07-29, 34 y.o.   MRN: [id number]  DOS: 04/07/15 s/p Left HWR  Subjective: 34 year old male presents the office today 2 weeks status post left fifth metatarsal hardware removal. He presents today for suture removal. He states his pain is improved knee is not requiring pain medication for the surgery. He denies any swelling, redness or any drainage from the incision. No other complaints at this time in no acute changes.  Objective: AAO 3, NAD Neurovascular status intact and unchanged Incision on the dorsal lateral aspect the left foot as well coapted without any evidence of dehiscence and sutures are intact. There is minimal upon edema and there is no surrounding erythema or increase in warmth. There is no drainage or purulence expressed. There is no tenderness to palpation overlying the surgical site at this time. No pain with MTPJ range of motion. There is continued tenderness on the lateral aspect of the right fifth metatarsal head and along the tailor's bunion deformity which is prominent. No open lesions or pre-ulcer lesions identified bilaterally. There is no pain with calf compression, swelling, warmth, erythema.  Assessment: 34 year old male 2 week status post left hardware removal, right tailor's bunion symptomatic  Plan: -Treatment options discussed including all alternatives, risks, and complications -Sutures were removed the left side without complications. Antibiotic ointment is placed over the incision followed by dry sterile dressing. He is to shower tomorrow with the incision remains coapted. Continue with antibiotic ointment and a Band-Aid overlying the area. -Inserted transition back to a regular shoe as tolerated to the left side. -Continue ice and elevation. Continue to limit activity. -He is scheduled for right foot tailor's bunionectomy next week. He has no further questions or concerns for that surgery. -Follow-up after surgery or  sooner if any problems arise. In the meantime, encouraged to call the office with any questions, concerns, change in symptoms.   Ovid Curd, DPM

## 2015-04-28 DIAGNOSIS — M2011 Hallux valgus (acquired), right foot: Secondary | ICD-10-CM | POA: Diagnosis not present

## 2015-05-03 ENCOUNTER — Ambulatory Visit (INDEPENDENT_AMBULATORY_CARE_PROVIDER_SITE_OTHER): Payer: BLUE CROSS/BLUE SHIELD

## 2015-05-03 ENCOUNTER — Encounter: Payer: Self-pay | Admitting: Podiatry

## 2015-05-03 ENCOUNTER — Ambulatory Visit (INDEPENDENT_AMBULATORY_CARE_PROVIDER_SITE_OTHER): Payer: BLUE CROSS/BLUE SHIELD | Admitting: Podiatry

## 2015-05-03 VITALS — BP 109/65 | HR 75 | Resp 18

## 2015-05-03 DIAGNOSIS — Z9889 Other specified postprocedural states: Secondary | ICD-10-CM

## 2015-05-03 DIAGNOSIS — M205X1 Other deformities of toe(s) (acquired), right foot: Secondary | ICD-10-CM

## 2015-05-08 ENCOUNTER — Encounter: Payer: Self-pay | Admitting: Podiatry

## 2015-05-08 NOTE — Progress Notes (Signed)
Patient ID: Jeremy [name], male   DOB: 03/25/1981, 34 y.o.   MRN: [id number]  Subjective: 34 year old male presents the office today one-week status post right fifth metatarsal osteotomy due to tailors bunionectomy. He states that he is doing well the pain is better than the left side was after that surgery. He has continued with the cam walker for which he wears all the time he states. He denies any recent injury or trauma. Denies any falls. Denies any systemic complaints such as fevers, chills, nausea, vomiting. No calf pain, chest pain, shortness of breath. No other complaints at this time in no acute changes. He is also wearing her regular shoe in the left side without any difficulty.  Objective: AAO 3, NAD DP/PT pulses 2/4, CRT less than 3 seconds Protective sensation intact with Simms wants monofilament Incisional the dorsal lateral aspect of the right fifth metatarsals well coapted without any evidence of dehiscence and sutures are intact. There is no surrounding erythema, ascending cellulitis, fluctuance, crepitus, malodor, drainage/purulence. There is mild tenderness palpation Along the surgical site. Scars well-formed the left side without any dehiscence. There is no tenderness the left side. No other areas of tenderness to bilateral lower extremity's. No overlying edema, erythema, increased warmth except otherwise stated. No other open lesions or pre-ulcerative lesions. No pain with calf compression, swelling, warmth, erythema.  Assessment: 34 year old male status post right fifth metatarsal osteotomy for tailors bunionectomy, doing well  Plan: -Treatment options discussed including all alternatives, risks, and complications -X-rays were obtained and reviewed with the patient.  -Antibiotic ointment is placed over the incision the right side followed by dry she'll dressing. Keep dressing clean, dry, intact. -Pain medication as needed -Ice and elevation -Continue regular shoe gear on the  left side -Monitor for any clinical signs or symptoms of infection and directed to call the office immediately should any occur or go to the ER. -Follow-up in 1 week for likely suture removal or sooner if any problems arise. In the meantime, encouraged to call the office with any questions, concerns, change in symptoms.   Ovid Curd, DPM

## 2015-05-14 ENCOUNTER — Ambulatory Visit (INDEPENDENT_AMBULATORY_CARE_PROVIDER_SITE_OTHER): Payer: BLUE CROSS/BLUE SHIELD | Admitting: Podiatry

## 2015-05-14 ENCOUNTER — Encounter: Payer: Self-pay | Admitting: Podiatry

## 2015-05-14 ENCOUNTER — Ambulatory Visit (INDEPENDENT_AMBULATORY_CARE_PROVIDER_SITE_OTHER): Payer: BLUE CROSS/BLUE SHIELD

## 2015-05-14 ENCOUNTER — Ambulatory Visit: Payer: BLUE CROSS/BLUE SHIELD

## 2015-05-14 VITALS — BP 91/60 | HR 75 | Resp 18

## 2015-05-14 DIAGNOSIS — M205X1 Other deformities of toe(s) (acquired), right foot: Secondary | ICD-10-CM | POA: Diagnosis not present

## 2015-05-14 DIAGNOSIS — M205X2 Other deformities of toe(s) (acquired), left foot: Secondary | ICD-10-CM

## 2015-05-14 NOTE — Progress Notes (Signed)
Patient ID: Jeremy [name], male   DOB: 03/22/1981, 34 y.o.   MRN: [id number]  Subjective: 34 year old male presents the office today 2 weeks status post right fifth metatarsal osteotomy for  tailors bunionectomy. He continues to wear his CAM boot however he does say that he did kick his foot and he thinks the screw "fell out".  He also states that he walked 4 laps around [place] last week. He gets some occasional pain in the right foot although his pain is controlled mostly. He continues to do well on the left foot and able to wear a regular shoe as tolerated. Denies any systemic complaints such as fevers, chills, nausea, vomiting. No calf pain, chest pain, shortness of breath. No other complaints at this time in no acute changes.   Objective: AAO 3, NAD; presents in a CAM  Boot DP/PT pulses 2/4, CRT less than 3 seconds Protective sensation intact with Simms wants monofilament Incisional the dorsal lateral aspect of the right fifth metatarsal is well coapted without any evidence of dehiscence and sutures are intact. There is no surrounding erythema, ascending cellulitis, fluctuance, crepitus, malodor, drainage/purulence.  There is mild tenderness to palpation along the surgical site on the right foot. There trace edema over the area. There is no tenderness to palpation upon the scar of the left foot from prior surgery. No pain along the surgical site. There is no overlying edema, erythema, increased warmth. No other open lesions or pre-ulcerative lesions identified bilaterally. There is no pain with calf compression, swelling, warmth, erythema.  Assessment: 34 year old male status post right fifth metatarsal osteotomy for tailors bunionectomy, doing well  Plan: -X-rays were obtained and reviewed with the patient. Hardware is intact of the right foot. Osteotomy is present. There does not appear to be any breakage or loosening of the hardware at this time on the right side. -Treatment options discussed  including all alternatives, risks, and complications -Suture ends cut. Antibiotic ointment is placed over the incision. He can remove the bandage tomorrow and start to shower as long as the incision remains closed. If there is any question to hold off on showering and call the office.  -Pain medication as needed -Ice and elevation -Continue regular shoe gear on the left side -Monitor for any clinical signs or symptoms of infection and directed to call the office immediately should any occur or go to the ER. -Follow-up in 2 week or sooner if any problems arise. In the meantime, encouraged to call the office with any questions, concerns, change in symptoms.   Ovid Curd, DPM

## 2015-05-25 ENCOUNTER — Telehealth: Payer: Self-pay | Admitting: Neurology

## 2015-05-25 DIAGNOSIS — Z79899 Other long term (current) drug therapy: Secondary | ICD-10-CM

## 2015-05-25 DIAGNOSIS — G35 Multiple sclerosis: Secondary | ICD-10-CM

## 2015-05-25 NOTE — Telephone Encounter (Signed)
Received faxed lab results, and RAS has reviewed them.  I have spoken with Jeremy [name], and per RAS, advised that he should decrease Gilenya to QOD, and we will recheck lft's in a month.  He verbalized understanding of same, sts. is going to be in our office on 06-16-15 for an mri and would like to repeat labs at that time.  LFT's ordered to be done 11-23/fim

## 2015-05-25 NOTE — Telephone Encounter (Signed)
Patient is calling and states that his liver enzymes are Q33.  Thanks!

## 2015-05-25 NOTE — Telephone Encounter (Signed)
I have spoken with Jeremy [name]--he sts. pcp drew lft's and they are high.  He will fax copy of lab results to me at [phone]/fim

## 2015-05-31 ENCOUNTER — Encounter: Payer: Self-pay | Admitting: Podiatry

## 2015-05-31 ENCOUNTER — Ambulatory Visit (INDEPENDENT_AMBULATORY_CARE_PROVIDER_SITE_OTHER): Payer: BLUE CROSS/BLUE SHIELD

## 2015-05-31 ENCOUNTER — Ambulatory Visit (INDEPENDENT_AMBULATORY_CARE_PROVIDER_SITE_OTHER): Payer: BLUE CROSS/BLUE SHIELD | Admitting: Podiatry

## 2015-05-31 VITALS — BP 109/76 | HR 76 | Resp 18

## 2015-05-31 DIAGNOSIS — M205X1 Other deformities of toe(s) (acquired), right foot: Secondary | ICD-10-CM | POA: Diagnosis not present

## 2015-05-31 DIAGNOSIS — Z9889 Other specified postprocedural states: Secondary | ICD-10-CM

## 2015-06-01 ENCOUNTER — Encounter: Payer: Self-pay | Admitting: Podiatry

## 2015-06-01 NOTE — Progress Notes (Signed)
Patient ID: Jeremy [name], male   DOB: 07/09/1981, 34 y.o.   MRN: [id number]  Subjective: 34 year old male presents the office status post right tailor's bunionectomy performed on 04/28/2015. He states that overall he is doing well his pain is significantly improved. He discontinued the cam boot. He has good barefoot at home some without the boot he has no pain when doing this. He states he can feel the screw at times and he is requesting to see when the screw can be removed. He denies any pain in the left side. He denies a systemic complaints as fevers, chills, nausea, vomiting. No calf pain, chest pain, shortness of breath. No other complaints at this time.  Objective: AAO 3, NAD DP/PT pulses palpable 2/4, CRT less than 3 seconds Incision is well coapted without any evidence of dehiscence eschar is formed bilaterally. There is no tendinous palpation on the surgical site the right foot were to the left foot. There is mild edema along the surgical site on the right foot without any associated erythema or increase in warmth. There appears to be adequate reduction of the tailor's bunion compared to prior to surgery. Hardware is not palpable at this time although subjectively he states the can feel it. There is no other areas of tenderness bilateral lower extremity. There is no other areas edema, erythema, increase in warmth. There is no pain with calf compression, swelling, warmth, erythema.  Assessment: 34 year old male status post right tailor bunion, doing well  Plan: -Treatment options discussed including all alternatives, risks, and complications -X-rays were obtained and reviewed with the patient. There appears to be increased consolidation across the tailor's bunionectomy of the right foot. Hardware is made projected position without any evidence of loosening or breakage. -At this time conservative transition to a Darco shoe as tolerated on the right side. Continue regular shoe in the left. -Ice  and elevation. -Pain medication as needed. -He would like to have the screw removed in the near future once the bone is healed. I discussed with him the end of December at the screw removed and he would like to proceed with this possible. He states he can feel the screw and he would have removed. I discussed hardware removal. The incision placement as well as the postoperative course was discussed with the patient. I discussed risks of the surgery which include, but not limited to, infection, bleeding, pain, swelling, need for further surgery, delayed or nonhealing, painful or ugly scar, numbness or sensation changes, over/under correction, recurrence, transfer lesions, further deformity, hardware failure, DVT/PE, loss of toe/foot. Patient understands these risks and wishes to proceed with surgery. The surgical consent was reviewed with the patient all 3 pages were signed. No promises or guarantees were given to the outcome of the procedure. All questions were answered to the best of my ability. Before the surgery the patient was encouraged to call the office if there is any further questions. The surgery will be performed at the [facility] on an outpatient basis. -Follow-up in 2 weeks or sooner if any problems arise. In the meantime, encouraged to call the office with any questions, concerns, change in symptoms.  *Xray next appointment on the right.   Ovid Curd, DPM

## 2015-06-12 ENCOUNTER — Other Ambulatory Visit: Payer: Self-pay | Admitting: Neurology

## 2015-06-14 NOTE — Telephone Encounter (Signed)
This is no longer on med list.  Would you like to continue med?  Thank you.

## 2015-06-16 ENCOUNTER — Ambulatory Visit (INDEPENDENT_AMBULATORY_CARE_PROVIDER_SITE_OTHER): Payer: BLUE CROSS/BLUE SHIELD

## 2015-06-16 ENCOUNTER — Encounter: Payer: Self-pay | Admitting: Neurology

## 2015-06-16 ENCOUNTER — Telehealth: Payer: Self-pay | Admitting: Neurology

## 2015-06-16 ENCOUNTER — Encounter: Payer: Self-pay | Admitting: *Deleted

## 2015-06-16 DIAGNOSIS — G35 Multiple sclerosis: Secondary | ICD-10-CM

## 2015-06-16 DIAGNOSIS — R202 Paresthesia of skin: Secondary | ICD-10-CM

## 2015-06-16 MED ORDER — GADOPENTETATE DIMEGLUMINE 469.01 MG/ML IV SOLN: 15.0000 mL | Freq: Once | INTRAVENOUS | Status: DC | PRN

## 2015-06-16 NOTE — Progress Notes (Signed)
Patient here for MRI, requesting Xanax. Per Dr Epimenio Foot, gave  Xanax 0.5 mg, x 3 tabs with instructions for use. Gave Xanax package to MRI tech.

## 2015-06-16 NOTE — Telephone Encounter (Signed)
Jeremy [name] said his Liver is  a 48 and he said that is very high. He also wasn't sure if he needed to get blood work done. So he wanted you to call him and let him know if he needs the blood work or not. The best number to contact is [phone]

## 2015-06-16 NOTE — Progress Notes (Unsigned)
Subjective:    Patient ID: Jeremy [name] is a 34 y.o. male.  HPI {Common ambulatory SmartLinks:19316}  Review of Systems  Objective:  Neurologic Exam  Physical Exam  Assessment:   ***  Plan:   ***

## 2015-06-16 NOTE — Telephone Encounter (Signed)
Spoke to Jeremy [name] - he will come in next week for his labs to be drawn (aware of lab hours).

## 2015-06-22 ENCOUNTER — Other Ambulatory Visit: Payer: Self-pay | Admitting: *Deleted

## 2015-06-22 DIAGNOSIS — Z79899 Other long term (current) drug therapy: Secondary | ICD-10-CM

## 2015-06-22 DIAGNOSIS — G35 Multiple sclerosis: Secondary | ICD-10-CM

## 2015-06-22 NOTE — Telephone Encounter (Signed)
-----   Message from Asa Lente, MD sent at 06/20/2015  7:03 PM EST ----- Please let him know that the MRI of the brain looks good.   No new or recent plaques.

## 2015-06-22 NOTE — Telephone Encounter (Signed)
I have spoken with Jeremy [name] this morning and per RAS, advised that mri brain shows no new or recent MS lesions.  He verbalized understanding of same.  He continues to take Gilenya qod due to elevated lft's.  He is due to  have lft's rechecked.  Orders in EPIC, and he will try and come by today/fim

## 2015-06-23 ENCOUNTER — Encounter (HOSPITAL_BASED_OUTPATIENT_CLINIC_OR_DEPARTMENT_OTHER): Payer: Self-pay

## 2015-06-23 ENCOUNTER — Emergency Department (HOSPITAL_BASED_OUTPATIENT_CLINIC_OR_DEPARTMENT_OTHER): Payer: BLUE CROSS/BLUE SHIELD

## 2015-06-23 ENCOUNTER — Emergency Department (HOSPITAL_BASED_OUTPATIENT_CLINIC_OR_DEPARTMENT_OTHER)
Admission: EM | Admit: 2015-06-23 | Discharge: 2015-06-23 | Disposition: A | Discharge status: Home / Self Care | Payer: BLUE CROSS/BLUE SHIELD | Attending: Emergency Medicine | Admitting: Emergency Medicine

## 2015-06-23 DIAGNOSIS — Z79899 Other long term (current) drug therapy: Secondary | ICD-10-CM | POA: Insufficient documentation

## 2015-06-23 DIAGNOSIS — F329 Major depressive disorder, single episode, unspecified: Secondary | ICD-10-CM | POA: Insufficient documentation

## 2015-06-23 DIAGNOSIS — F419 Anxiety disorder, unspecified: Secondary | ICD-10-CM | POA: Insufficient documentation

## 2015-06-23 DIAGNOSIS — L03113 Cellulitis of right upper limb: Secondary | ICD-10-CM | POA: Insufficient documentation

## 2015-06-23 DIAGNOSIS — W01198A Fall on same level from slipping, tripping and stumbling with subsequent striking against other object, initial encounter: Secondary | ICD-10-CM | POA: Diagnosis not present

## 2015-06-23 DIAGNOSIS — S59901A Unspecified injury of right elbow, initial encounter: Secondary | ICD-10-CM | POA: Diagnosis present

## 2015-06-23 DIAGNOSIS — E039 Hypothyroidism, unspecified: Secondary | ICD-10-CM | POA: Insufficient documentation

## 2015-06-23 DIAGNOSIS — Y9389 Activity, other specified: Secondary | ICD-10-CM | POA: Insufficient documentation

## 2015-06-23 DIAGNOSIS — G8929 Other chronic pain: Secondary | ICD-10-CM | POA: Diagnosis not present

## 2015-06-23 DIAGNOSIS — Y9289 Other specified places as the place of occurrence of the external cause: Secondary | ICD-10-CM | POA: Insufficient documentation

## 2015-06-23 DIAGNOSIS — Y998 Other external cause status: Secondary | ICD-10-CM | POA: Diagnosis not present

## 2015-06-23 DIAGNOSIS — Z87891 Personal history of nicotine dependence: Secondary | ICD-10-CM | POA: Insufficient documentation

## 2015-06-23 DIAGNOSIS — S50811A Abrasion of right forearm, initial encounter: Secondary | ICD-10-CM | POA: Insufficient documentation

## 2015-06-23 DIAGNOSIS — E785 Hyperlipidemia, unspecified: Secondary | ICD-10-CM | POA: Insufficient documentation

## 2015-06-23 DIAGNOSIS — W010XXA Fall on same level from slipping, tripping and stumbling without subsequent striking against object, initial encounter: Secondary | ICD-10-CM

## 2015-06-23 MED ORDER — CLINDAMYCIN PHOSPHATE 600 MG/50ML IV SOLN
600.0000 mg | Freq: Once | INTRAVENOUS | Status: AC
  Administered 2015-06-23: 600 mg via INTRAVENOUS
  Filled 2015-06-23: qty 50

## 2015-06-23 MED ORDER — IBUPROFEN 800 MG PO TABS
800.0000 mg | ORAL_TABLET | Freq: Once | ORAL | Status: AC
  Administered 2015-06-23: 800 mg via ORAL
  Filled 2015-06-23: qty 1

## 2015-06-23 MED ORDER — CLINDAMYCIN HCL 150 MG PO CAPS: 450.0000 mg | ORAL_CAPSULE | Freq: Three times a day (TID) | ORAL | Status: DC

## 2015-06-23 NOTE — ED Provider Notes (Signed)
CSN: [id number]     Arrival date & time 06/23/15  1905 History   First MD Initiated Contact with Patient 06/23/15 2002     Chief Complaint  Patient presents with  . Arm Injury     (Consider location/radiation/quality/duration/timing/severity/associated sxs/prior Treatment) Patient is a 34 y.o. male presenting with arm injury. The history is provided by the patient and medical records. No language interpreter was used.  Arm Injury Associated symptoms: no back pain, no fatigue and no fever      Jeremy [name] is a 34 y.o. male  with a hx of multiple sclerosis, chronic low back pain presents to the Emergency Department complaining of gradual, persistent, progressively worsening right elbow pain after a fall 3 days ago. Patient reports he lost his balance, striking his right forearm and elbow on a set of stairs. He denies laceration, open wound, previous problems with cellulitis, IV drug use. Patient reports small abrasion to the lower right forearm is healing without difficulty and is not located near the site of swelling and pain. Patient reports taking ibuprofen at home without difficulty. Patient also reports having ulnar nerve surgery several weeks ago and has been taking oxycodone IR for this.  Patient denies numbness or weakness in the right upper extremity. He denies fever, chills, nausea, vomiting, dizziness, syncope. Nothing seems to make his symptoms better and movement and palpation makes them worse.     Past Medical History  Diagnosis Date  . Hx of dizziness   . Gait disturbance   . Hypothyroidism   . Depression   . Dyslipidemia   . Back pain, chronic     low  . MS (multiple sclerosis) (HCC)     probable  . Testosterone deficiency   . Blood transfusion without reported diagnosis   . PONV (postoperative nausea and vomiting)     Nausea  . Anxiety   . Gynecomastia, male   . Snoring     "mild" sleep apnea. Pt was not required to wear CPAP machine, instead he uses nasal spray   . Vision abnormalities    Past Surgical History  Procedure Laterality Date  . Gynecomastia excision      X 2  . Gynecomastia mastectomy Bilateral 03/09/2014    Procedure: BILATERAL EXCISION GYNECOMASTIA;  Surgeon: Robyne Askew, MD;  Location: [facility] OR;  Service: General;  Laterality: Bilateral;   Family History  Problem Relation Age of Onset  . Cancer Mother   . Cancer Father   . Hypertension Father   . Stroke      Grandmother  . Hypertension Brother    Social History  Substance Use Topics  . Smoking status: Former Games developer  . Smokeless tobacco: Never Used  . Alcohol Use: 0.0 oz/week    0 Standard drinks or equivalent per week     Comment: social    Review of Systems  Constitutional: Negative for fever, diaphoresis, appetite change, fatigue and unexpected weight change.  HENT: Negative for mouth sores.   Eyes: Negative for visual disturbance.  Respiratory: Negative for cough, chest tightness, shortness of breath and wheezing.   Cardiovascular: Negative for chest pain.  Gastrointestinal: Negative for nausea, vomiting, abdominal pain, diarrhea and constipation.  Endocrine: Negative for polydipsia, polyphagia and polyuria.  Genitourinary: Negative for dysuria, urgency, frequency and hematuria.  Musculoskeletal: Positive for joint swelling and arthralgias. Negative for back pain and neck stiffness.  Skin: Positive for color change. Negative for rash.  Allergic/Immunologic: Negative for immunocompromised state.  Neurological: Negative for  syncope, light-headedness and headaches.  Hematological: Does not bruise/bleed easily.  Psychiatric/Behavioral: Negative for sleep disturbance. The patient is not nervous/anxious.       Allergies  Sulfa antibiotics  Home Medications   Prior to Admission medications   Medication Sig Start Date End Date Taking? Authorizing Provider  ALPRAZolam Prudy Feeler) 1 MG tablet Take 1 mg by mouth at bedtime as needed for sleep (take 2 tabs as needed at  night).    Historical Provider, MD  amphetamine-dextroamphetamine (ADDERALL XR) 30 MG 24 hr capsule Take 30 mg by mouth daily. 07/12/14   Historical Provider, MD  Armodafinil (NUVIGIL) 250 MG tablet Take 1 tablet (250 mg total) by mouth daily. 02/24/15   Asa Lente, MD  cephALEXin (KEFLEX) 500 MG capsule TAKE ONE CAPSULE BY MOUTH 3 TIMES A DAY FOR 7 DAYS 02/03/15   Historical Provider, MD  clindamycin (CLEOCIN) 150 MG capsule Take 3 capsules (450 mg total) by mouth 3 (three) times daily. 06/23/15   Grettel Rames, PA-C  Fingolimod HCl 0.5 MG CAPS Take 1 capsule (0.5 mg total) by mouth daily. 12/07/14   Asa Lente, MD  FLUoxetine (PROZAC) 20 MG capsule Take 20 mg by mouth daily. 07/06/14   Historical Provider, MD  gabapentin (NEURONTIN) 300 MG capsule ONE CAPSULE IN AM, ONE CAPSULE IN EVENING AND TWO AT BEDTIME 06/14/15   Asa Lente, MD  HYDROcodone-acetaminophen (NORCO) 7.5-325 MG per tablet as needed.  11/25/14   Historical Provider, MD  ibuprofen (ADVIL,MOTRIN) 800 MG tablet Take 800 mg by mouth every 6 (six) hours as needed.  12/05/13   Historical Provider, MD  levothyroxine (SYNTHROID, LEVOTHROID) 50 MCG tablet Take 50 mcg by mouth daily. 06/24/14   Historical Provider, MD  Multiple Vitamin (MULTIVITAMIN WITH MINERALS) TABS tablet Take 1 tablet by mouth daily.    Historical Provider, MD  simvastatin (ZOCOR) 20 MG tablet Take 20 mg by mouth daily. 06/24/14   Historical Provider, MD  tadalafil (CIALIS) 20 MG tablet Take 1 tablet (20 mg total) by mouth daily as needed for erectile dysfunction. 04/20/15   Asa Lente, MD  traMADol (ULTRAM) 50 MG tablet Take 1 tablet (50 mg total) by mouth 3 (three) times daily as needed. 04/20/15   Asa Lente, MD   BP 144/89 mmHg  Pulse 95  Temp(Src) 99.5 F (37.5 C) (Oral)  Resp 18  Ht  (1.676 m)  Wt 74.844 kg  BMI 26.64 kg/m2  SpO2 97% Physical Exam  Constitutional: He appears well-developed and well-nourished. No distress.  Awake,  alert, nontoxic appearance  HENT:  Head: Normocephalic and atraumatic.  Mouth/Throat: Oropharynx is clear and moist. No oropharyngeal exudate.  Eyes: Conjunctivae are normal. No scleral icterus.  Neck: Normal range of motion. Neck supple.  Cardiovascular: Normal rate, regular rhythm, normal heart sounds and intact distal pulses.   No murmur heard. Capillary refill < 3 sec  Pulmonary/Chest: Effort normal and breath sounds normal. No respiratory distress. He has no wheezes.  Equal chest expansion  Abdominal: Soft. Bowel sounds are normal. He exhibits no mass. There is no tenderness. There is no rebound and no guarding.  Musculoskeletal: Normal range of motion. He exhibits tenderness. He exhibits no edema.  ROM: Full range of motion of the right shoulder right wrist and all fingers of the right hand Full flexion of the right elbow with somewhat limited extension due to pain Large area of erythema with increased warmth and a small amount of induration noted to the medial  elbow extending midway down the forearm and midway up the upper arm on the medial side No circumferential swelling or erythema  Neurological: He is alert. Coordination normal.  Sensation intact to dull and sharp right upper extremity Strength 5/5 in the right upper extremity including grip strength  Skin: Skin is warm and dry. He is not diaphoretic. There is erythema.  No tenting of the skin  Psychiatric: He has a normal mood and affect.  Nursing note and vitals reviewed.   ED Course  Procedures (including critical care time)  Imaging Review Dg Elbow Complete Right  06/23/2015  CLINICAL DATA:  Status post fall today with injury to the right elbow. EXAM: RIGHT ELBOW - COMPLETE 3+ VIEW COMPARISON:  None. FINDINGS: There is no evidence of fracture, dislocation, or joint effusion. There is a small osteophyte in the proximal ulna. Soft tissues are unremarkable. IMPRESSION: No acute fracture or dislocation. Electronically  Signed   By: Sherian Rein M.D.   On: 06/23/2015 21:01   I have personally reviewed and evaluated these images and lab results as part of my medical decision-making.    MDM   Final diagnoses:  Fall from slip, trip, or stumble, initial encounter  Cellulitis of right arm   Jeremy [name] presents with right arm pain after reported fall. On exam patient with increased warmth, erythema and mild induration overlying the medial portion of the right elbow. Patient with slightly restricted range of motion but otherwise fluid movement of the right elbow. Doubt septic joint. Patient is afebrile.  X-ray shows no acute fracture or dislocation. No open wounds to suggest reason for cellulitis however this is my concern. Discussed with patient and Dr. Judd Lien who also evaluated the patient.  Patient requesting IV antibiotics. Will give IV clindamycin and discharged home with oral. Discussed with patient the potential that his infection may worsen. He is return to the emergency department if symptoms worsen or he becomes unable to move his elbow. Otherwise he is to follow-up with his primary care physician within 2 days for repeat evaluation.  Pt is without risk factors for HIV; no recent use of steroids however pt does take fingolimod as an immunosuppressive for his MS.  No Hx of diabetes.  Pt is without gross abscess for which I&D would be possible.  Area marked and pt encouraged to return if redness begins to streak, extends beyond the markings, and/or fever or nausea/vomiting develop.  Pt is alert, oriented, NAD, afebrile, non tachycardic, nonseptic and nontoxic appearing.  Pt to be d/c on oral antibiotics with strict f/u instructions.    BP 144/89 mmHg  Pulse 95  Temp(Src) 99.5 F (37.5 C) (Oral)  Resp 18  Ht 5\' 6"  (1.676 m)  Wt 74.844 kg  BMI 26.64 kg/m2  SpO2 97%    Dierdre Forth, PA-C 06/23/15 2225  Geoffery Lyons, MD 06/23/15 2300

## 2015-06-23 NOTE — Discharge Instructions (Signed)
1. Medications: Clindamycin, usual home medications 2. Treatment: rest, drink plenty of fluids,  3. Follow Up: Please followup with your primary doctor in 2 days for discussion of your diagnoses and further evaluation after today's visit; if you do not have a primary care doctor use the resource guide provided to find one; Please return to the ER for worsening symptoms, fevers or erythema extending beyond the marked borders.    Cellulitis Cellulitis is an infection of the skin and the tissue beneath it. The infected area is usually red and tender. Cellulitis occurs most often in the arms and lower legs.  CAUSES  Cellulitis is caused by bacteria that enter the skin through cracks or cuts in the skin. The most common types of bacteria that cause cellulitis are staphylococci and streptococci. SIGNS AND SYMPTOMS   Redness and warmth.  Swelling.  Tenderness or pain.  Fever. DIAGNOSIS  Your health care provider can usually determine what is wrong based on a physical exam. Blood tests may also be done. TREATMENT  Treatment usually involves taking an antibiotic medicine. HOME CARE INSTRUCTIONS   Take your antibiotic medicine as directed by your health care provider. Finish the antibiotic even if you start to feel better.  Keep the infected arm or leg elevated to reduce swelling.  Apply a warm cloth to the affected area up to 4 times per day to relieve pain.  Take medicines only as directed by your health care provider.  Keep all follow-up visits as directed by your health care provider. SEEK MEDICAL CARE IF:   You notice red streaks coming from the infected area.  Your red area gets larger or turns dark in color.  Your bone or joint underneath the infected area becomes painful after the skin has healed.  Your infection returns in the same area or another area.  You notice a swollen bump in the infected area.  You develop new symptoms.  You have a fever. SEEK IMMEDIATE MEDICAL  CARE IF:   You feel very sleepy.  You develop vomiting or diarrhea.  You have a general ill feeling (malaise) with muscle aches and pains.   This information is not intended to replace advice given to you by your health care provider. Make sure you discuss any questions you have with your health care provider.   Document Released: 04/19/2005 Document Revised: 03/31/2015 Document Reviewed: 09/25/2011 Elsevier Interactive Patient Education [employer]

## 2015-06-23 NOTE — ED Notes (Signed)
Fell Monday abrasion to right forearm-now with redness and swelling

## 2015-07-06 ENCOUNTER — Telehealth: Payer: Self-pay | Admitting: *Deleted

## 2015-07-06 NOTE — Telephone Encounter (Signed)
Pt states his shoe on the left foot even with the orthotics, the foot is shifting and he is wearing through his shoe.  I offered pt an appt prior to his 07/23/2015 surgery, to have the orthotics evaluated and possibly adjusted.

## 2015-07-09 ENCOUNTER — Ambulatory Visit: Payer: BLUE CROSS/BLUE SHIELD | Admitting: Podiatry

## 2015-07-13 ENCOUNTER — Encounter: Payer: Self-pay | Admitting: Neurology

## 2015-07-13 ENCOUNTER — Ambulatory Visit (INDEPENDENT_AMBULATORY_CARE_PROVIDER_SITE_OTHER): Payer: BLUE CROSS/BLUE SHIELD | Admitting: Neurology

## 2015-07-13 VITALS — BP 142/85 | HR 88 | Ht 66.0 in | Wt 172.0 lb

## 2015-07-13 DIAGNOSIS — G35 Multiple sclerosis: Secondary | ICD-10-CM | POA: Diagnosis not present

## 2015-07-13 MED ORDER — ONDANSETRON HCL 4 MG PO TABS: 4.0000 mg | ORAL_TABLET | Freq: Every day | ORAL | Status: DC | PRN

## 2015-07-13 NOTE — Progress Notes (Signed)
[facility]  PATIENT: Jeremy [name] DOB: 12-02-1980  REFERRING CLINICIAN: Burton Apley  HISTORY FROM: patient REASON FOR VISIT: MS, needs to change therapy   HISTORICAL  CHIEF COMPLAINT:  Chief Complaint  Patient presents with  . Multiple Sclerosis    Room 5. Reports memory, garbled speech and fatigue are no different from his last visit.  He is continuing to take Gilenya every other day due to elevated liver labs.    HISTORY OF PRESENT ILLNESS:   Jeremy [name] is a 34 year old man with multiple sclerosis.   He is on Gilenya and tolerates it well.   He has not had any recent exacerbations.   His MRI 05/2015 was unchanged.      e had an insurance physical and LFTs were elevated though they were normal with [place] 04/21/15.     He gets some nausea many days.         He had his ulnar nerve transposition recently and everything went well.    He had cellulitis recently  and was on clindamycin  Gait/strength/sensation:  He reports a mild gait disturbance with ataxia but no weakness in his legs.  However, he has not fallen.   He has some numbness in his limbs, worse in left arm.   Vertigo:  He has had some vertigo spells but these are less than earlier in year.     Bladder:  He has mild urinary frequency but this is not bad enough to treat. He has 0-1 nocturia.   He was also having some erectile dysfunction. However, testosterone was found to be low normal. Insurance is not covering that at this point.   Cialis helped much better than Viagra.       Farigue/sleep:   He has fatigue most days, physical more than mental. He notes that he works third shift and the transition can be difficult at times. He usually goes to bed at 5 or 6 in the mornings. However, on days he has off, he will often go to bed at 1 in the morning to try to keep a schedule more like those around him.    He denies sleepiness.    He takes one xanax at night.     Mood:   He has had some depression and was  placed on Prozac with benefit.    He has anxiety in closed spaces and with shaving.   He notes mood swings    MS History:   diagnosed in January 2012.   In 2009, he had a several week episode of right hand clumsiness associated with some electric shock sensations. He did not see a doctor about the symptoms. In December 2011, he had the onset of altered taste. A week later, he had nausea and dizziness and over the next couple days these symptoms worsened and he developed right-sided clumsiness and slurred speech. First toward his PCP who felt he might have an ear infection and he was referred to ENT. ENT evaluated him and referred him to Dr. Anne Hahn at [facility] who noted an ataxic gait and ordered an MRI of the spine and brain. Those studies were consistent with multiple sclerosis and he underwent a lumbar puncture in April 2011 that was also consistent with MS. He was started on Rebif in June 2011 and remain on the medicine until September 2014 when a repeat MRI showed the interval development of several more lesions. He was switched to [place] but insurance would no longer cover it  so we switched to Gilenya early 2016.     MRIs from 05/25/2014 show a moderate size focus in the left temporal lobe and another 10 fairly small periventricular and deep white matter foci. MRI of the cervical spine showed 2 foci, one at C4 and one at C6. There were no enhancing lesions on those MRIs.  REVIEW OF SYSTEMS:  Constitutional: No fevers, chills, sweats, or change in appetite Eyes: No visual changes, double vision, eye pain Ear, nose and throat: No hearing loss, ear pain, nasal congestion, sore throat Cardiovascular: No chest pain, palpitations Respiratory:  No shortness of breath at rest or with exertion.   No wheezes GastrointestinaI: No nausea, vomiting, diarrhea, abdominal pain, fecal incontinence Genitourinary:  No dysuria, urinary retention or frequency.  No nocturia. Musculoskeletal:  No neck pain,  back pain Integumentary: No rash, pruritus, skin lesions Neurological: as above Psychiatric: No depression at this time.  No anxiety Endocrine: No palpitations, diaphoresis, change in appetite, change in weigh or increased thirst Hematologic/Lymphatic:  No anemia, purpura, petechiae. Allergic/Immunologic: No itchy/runny eyes, nasal congestion, recent allergic reactions, rashes  ALLERGIES: Allergies  Allergen Reactions  . Sulfa Antibiotics Nausea Only    HOME MEDICATIONS: Outpatient Prescriptions Prior to Visit  Medication Sig Dispense Refill  . ALPRAZolam (XANAX) 1 MG tablet Take 1 mg by mouth at bedtime as needed for sleep (take 2 tabs as needed at night).    Marland Kitchen amphetamine-dextroamphetamine (ADDERALL XR) 30 MG 24 hr capsule Take 30 mg by mouth daily.  0  . Armodafinil (NUVIGIL) 250 MG tablet Take 1 tablet (250 mg total) by mouth daily. 30 tablet 5  . cephALEXin (KEFLEX) 500 MG capsule TAKE ONE CAPSULE BY MOUTH 3 TIMES A DAY FOR 7 DAYS  0  . clindamycin (CLEOCIN) 150 MG capsule Take 3 capsules (450 mg total) by mouth 3 (three) times daily. 90 capsule 0  . Fingolimod HCl 0.5 MG CAPS Take 1 capsule (0.5 mg total) by mouth daily. 90 capsule 3  . FLUoxetine (PROZAC) 20 MG capsule Take 20 mg by mouth daily.  3  . gabapentin (NEURONTIN) 300 MG capsule ONE CAPSULE IN AM, ONE CAPSULE IN EVENING AND TWO AT BEDTIME 120 capsule 5  . HYDROcodone-acetaminophen (NORCO) 7.5-325 MG per tablet as needed.   0  . ibuprofen (ADVIL,MOTRIN) 800 MG tablet Take 800 mg by mouth every 6 (six) hours as needed.     Marland Kitchen levothyroxine (SYNTHROID, LEVOTHROID) 50 MCG tablet Take 50 mcg by mouth daily.  4  . Multiple Vitamin (MULTIVITAMIN WITH MINERALS) TABS tablet Take 1 tablet by mouth daily.    . simvastatin (ZOCOR) 20 MG tablet Take 20 mg by mouth daily.  4  . tadalafil (CIALIS) 20 MG tablet Take 1 tablet (20 mg total) by mouth daily as needed for erectile dysfunction. 4 tablet 11  . traMADol (ULTRAM) 50 MG tablet  Take 1 tablet (50 mg total) by mouth 3 (three) times daily as needed. 90 tablet 4   Facility-Administered Medications Prior to Visit  Medication Dose Route Frequency Provider Last Rate Last Dose  . gadopentetate dimeglumine (MAGNEVIST) injection 15 mL  15 mL Intravenous Once PRN Asa Lente, MD        PAST MEDICAL HISTORY: Past Medical History  Diagnosis Date  . Hx of dizziness   . Gait disturbance   . Hypothyroidism   . Depression   . Dyslipidemia   . Back pain, chronic     low  . MS (multiple sclerosis) (HCC)  probable  . Testosterone deficiency   . Blood transfusion without reported diagnosis   . PONV (postoperative nausea and vomiting)     Nausea  . Anxiety   . Gynecomastia, male   . Snoring     "mild" sleep apnea. Pt was not required to wear CPAP machine, instead he uses nasal spray  . Vision abnormalities     PAST SURGICAL HISTORY: Past Surgical History  Procedure Laterality Date  . Gynecomastia excision      X 2  . Gynecomastia mastectomy Bilateral 03/09/2014    Procedure: BILATERAL EXCISION GYNECOMASTIA;  Surgeon: Robyne Askew, MD;  Location: [facility] OR;  Service: General;  Laterality: Bilateral;  . Bunionectomy Right   . Ulnar nerve repair Left     FAMILY HISTORY: Family History  Problem Relation Age of Onset  . Cancer Mother   . Cancer Father   . Hypertension Father   . Stroke      Grandmother  . Hypertension Brother     SOCIAL HISTORY:  Social History   Social History  . Marital Status: Single    Spouse Name: N/A  . Number of Children: 0  . Years of Education: College    Occupational History  . Chef     Social History Main Topics  . Smoking status: Former Games developer  . Smokeless tobacco: Never Used  . Alcohol Use: 0.0 oz/week    0 Standard drinks or equivalent per week     Comment: social  . Drug Use: No  . Sexual Activity: Not on file   Other Topics Concern  . 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    Patient has a 4 year college education.    Patient has no children.            PHYSICAL EXAM  Filed Vitals:   07/13/15 1309  BP: 142/85  Pulse: 88  Height:  (1.676 m)  Weight: 172 lb (78.019 kg)    Body mass index is 27.77 kg/(m^2).   General: The patient is well-developed and well-nourished and in no acute distress  Skin: Extremities are without significant edema.  Neurologic Exam  Mental status: The patient is alert and oriented x 3 at the time of the examination. The patient has apparent normal recent and remote memory, with an apparently normal attention span and concentration ability.   Speech is normal.  Cranial nerves: Extraocular movements are full.  There is good facial sensation to soft touch bilaterally.Facial strength is normal.  Trapezius and sternocleidomastoid strength is normal. No dysarthria is noted.   No obvious hearing deficits are noted.  Motor:  Muscle bulk and tone are normal. Strength is  5 / 5 in all 4 extremities.   Sensory: Sensory testing is intact to pinprick, soft touch, vibration sensation, and position sense on all 4 extremities.  Coordination: Cerebellar testing reveals mildly reduced right finger-nose-finger and heel-to-shin.  Gait and station: Station and gait are normal. Tandem gait is wide. Romberg is negative.   Reflexes: Deep tendon reflexes are increased in legs with spread at both knees.        DIAGNOSTIC DATA (LABS, IMAGING, TESTING) - I reviewed patient records, labs, notes, testing and imaging myself where available.  Lab Results  Component Value Date   WBC 4.3 04/20/2015   HGB 14.9 03/02/2014   HCT 45.6 04/20/2015   MCV 92.3 03/02/2014   PLT 169 03/02/2014  Component Value Date/Time   NA 142 03/02/2014 1342   K 4.9 03/02/2014 1342   CL 104 03/02/2014 1342   CO2 26 03/02/2014 1342   GLUCOSE 82 03/02/2014 1342   BUN 15 03/02/2014 1342   CREATININE 0.86 03/02/2014 1342   CALCIUM 9.6  03/02/2014 1342   PROT 7.3 04/20/2015 1300   ALBUMIN 4.8 04/20/2015 1300   AST 23 04/20/2015 1300   ALT 42 04/20/2015 1300   ALKPHOS 80 04/20/2015 1300   BILITOT 0.6 04/20/2015 1300   GFRNONAA >90 03/02/2014 1342   GFRAA >90 03/02/2014 1342      ASSESSMENT AND PLAN  Multiple sclerosis (HCC)   1.   Continue Gilenya.   2.   Renewed tramadol for his nerve related pain. 3.   Cialis for ED. I will also check testosterone and as it has been low in the past. He may require supplementation. We discussed stopping the fluoxetine, though he feels it has been beneficial so we'll hold off at this point. 4.   He will continue to be active and exercises as tolerated. Return in 4 months or sooner if there are new or worsening neurologic symptoms.  Dianelly Ferran A. Epimenio Foot, MD, PhD 07/13/2015, 1:16 PM Certified in Neurology, Clinical Neurophysiology, Sleep Medicine, Pain Medicine and Neuroimaging  [facility] [address] [place], [place] [address] [phone]

## 2015-07-14 ENCOUNTER — Telehealth: Payer: Self-pay | Admitting: *Deleted

## 2015-07-14 LAB — CBC WITH DIFFERENTIAL/PLATELET
BASOS ABS: 0 10*3/uL (ref 0.0–0.2)
Basos: 0 %
EOS (ABSOLUTE): 0.1 10*3/uL (ref 0.0–0.4)
EOS: 2 %
Hematocrit: 45.7 % (ref 37.5–51.0)
Hemoglobin: 15.4 g/dL (ref 12.6–17.7)
Immature Grans (Abs): 0 10*3/uL (ref 0.0–0.1)
Immature Granulocytes: 1 %
LYMPHS ABS: 0.7 10*3/uL (ref 0.7–3.1)
LYMPHS: 21 %
MCH: 30.5 pg (ref 26.6–33.0)
MCHC: 33.7 g/dL (ref 31.5–35.7)
MCV: 91 fL (ref 79–97)
MONOCYTES: 1 %
Monocytes Absolute: 0 10*3/uL — ABNORMAL LOW (ref 0.1–0.9)
NEUTROS ABS: 2.4 10*3/uL (ref 1.4–7.0)
Neutrophils: 75 %
PLATELETS: 184 10*3/uL (ref 150–379)
RBC: 5.05 x10E6/uL (ref 4.14–5.80)
RDW: 13.3 % (ref 12.3–15.4)
WBC: 3.3 10*3/uL — AB (ref 3.4–10.8)

## 2015-07-14 LAB — HEPATIC FUNCTION PANEL
ALT: 92 IU/L — AB (ref 0–44)
AST: 33 IU/L (ref 0–40)
Albumin: 4.8 g/dL (ref 3.5–5.5)
Alkaline Phosphatase: 97 IU/L (ref 39–117)
Bilirubin Total: 0.8 mg/dL (ref 0.0–1.2)
Bilirubin, Direct: 0.18 mg/dL (ref 0.00–0.40)
TOTAL PROTEIN: 7.1 g/dL (ref 6.0–8.5)

## 2015-07-14 NOTE — Telephone Encounter (Signed)
-----   Message from Asa Lente, MD sent at 07/14/2015  1:22 PM EST ----- Please note that the liver function tests shows that the ALT is mildly elevated at 92 (less than 44 is normal). I can to stay on every other day and we will recheck in 2 months.

## 2015-07-14 NOTE — Telephone Encounter (Signed)
I have spoken with Jeremy [name] this afternoon and per RAS, advised that lft's are mildly high; he should continue qod Gilenya, will recheck LFT's in 2 mos.  I will call him with a reminder call in Feb. 2017.  He verbalized understanding of same/fim

## 2015-07-20 ENCOUNTER — Ambulatory Visit: Payer: BLUE CROSS/BLUE SHIELD | Admitting: Neurology

## 2015-07-21 ENCOUNTER — Encounter (HOSPITAL_BASED_OUTPATIENT_CLINIC_OR_DEPARTMENT_OTHER): Payer: Self-pay

## 2015-07-21 ENCOUNTER — Emergency Department (HOSPITAL_BASED_OUTPATIENT_CLINIC_OR_DEPARTMENT_OTHER)
Admission: EM | Admit: 2015-07-21 | Discharge: 2015-07-21 | Disposition: A | Discharge status: Home / Self Care | Payer: BLUE CROSS/BLUE SHIELD | Attending: Emergency Medicine | Admitting: Emergency Medicine

## 2015-07-21 DIAGNOSIS — I808 Phlebitis and thrombophlebitis of other sites: Secondary | ICD-10-CM | POA: Diagnosis not present

## 2015-07-21 DIAGNOSIS — Z87891 Personal history of nicotine dependence: Secondary | ICD-10-CM | POA: Insufficient documentation

## 2015-07-21 DIAGNOSIS — M79631 Pain in right forearm: Secondary | ICD-10-CM | POA: Diagnosis present

## 2015-07-21 DIAGNOSIS — Z8669 Personal history of other diseases of the nervous system and sense organs: Secondary | ICD-10-CM | POA: Diagnosis not present

## 2015-07-21 DIAGNOSIS — I809 Phlebitis and thrombophlebitis of unspecified site: Secondary | ICD-10-CM

## 2015-07-21 DIAGNOSIS — E785 Hyperlipidemia, unspecified: Secondary | ICD-10-CM | POA: Diagnosis not present

## 2015-07-21 DIAGNOSIS — Z79899 Other long term (current) drug therapy: Secondary | ICD-10-CM | POA: Diagnosis not present

## 2015-07-21 DIAGNOSIS — F329 Major depressive disorder, single episode, unspecified: Secondary | ICD-10-CM | POA: Diagnosis not present

## 2015-07-21 DIAGNOSIS — G8929 Other chronic pain: Secondary | ICD-10-CM | POA: Insufficient documentation

## 2015-07-21 DIAGNOSIS — Z792 Long term (current) use of antibiotics: Secondary | ICD-10-CM | POA: Insufficient documentation

## 2015-07-21 DIAGNOSIS — E039 Hypothyroidism, unspecified: Secondary | ICD-10-CM | POA: Diagnosis not present

## 2015-07-21 DIAGNOSIS — Z87438 Personal history of other diseases of male genital organs: Secondary | ICD-10-CM | POA: Diagnosis not present

## 2015-07-21 DIAGNOSIS — F419 Anxiety disorder, unspecified: Secondary | ICD-10-CM | POA: Diagnosis not present

## 2015-07-21 MED ORDER — NAPROXEN 500 MG PO TABS: 500.0000 mg | ORAL_TABLET | Freq: Two times a day (BID) | ORAL | Status: DC

## 2015-07-21 NOTE — Discharge Instructions (Signed)
Naproxen as prescribed.  Apply warm compresses as frequently as possible for the next several days.  Follow-up with your hand surgeon if not improving in the next week, and return to the ER if you develop increased redness, pain, fever, swelling or other new and concerning symptoms.   Phlebitis Phlebitis is soreness and swelling (inflammation) of a vein. This can occur in your arms, legs, or torso (trunk), as well as deeper inside your body. Phlebitis is usually not serious when it occurs close to the surface of the body. However, it can cause serious problems when it occurs in a vein deeper inside the body. CAUSES  Phlebitis can be triggered by various things, including:   Reduced blood flow through your veins. This can happen with:  Bed rest over a long period.  Long-distance travel.  Injury.  Surgery.  Being overweight (obese) or pregnant.  Having an IV tube put in the vein and getting certain medicines through the vein.  Cancer and cancer treatment.  Use of illegal drugs taken through the vein.  Inflammatory diseases.  Inherited (genetic) diseases that increase the risk of blood clots.  Hormone therapy, such as birth control pills. SIGNS AND SYMPTOMS   Red, tender, swollen, and painful area on your skin. Usually, the area will be long and narrow.  Firmness along the center of the affected area. This can indicate that a blood clot has formed.  Low-grade fever. DIAGNOSIS  A health care provider can usually diagnose phlebitis by examining the affected area and asking about your symptoms. To check for infection or blood clots, your health care provider may order blood tests or an ultrasound exam of the area. Blood tests and your family history may also indicate if you have an underlying genetic disease that causes blood clots. Occasionally, a piece of tissue is taken from the body (biopsy sample) if an unusual cause of phlebitis is suspected. TREATMENT  Treatment will  vary depending on the severity of the condition and the area of the body affected. Treatment may include:  Use of a warm compress or heating pad.  Use of compression stockings or bandages.  Anti-inflammatory medicines.  Removal of any IV tube that may be causing the problem.  Medicines that kill germs (antibiotics) if an infection is present.  Blood-thinning medicines if a blood clot is suspected or present.  In rare cases, surgery may be needed to remove damaged sections of vein. HOME CARE INSTRUCTIONS   Only take over-the-counter or prescription medicines as directed by your health care provider. Take all medicines exactly as prescribed.  Raise (elevate) the affected area above the level of your heart as directed by your health care provider.  Apply a warm compress or heating pad to the affected area as directed by your health care provider. Do not sleep with the heating pad.  Use compression stockings or bandages as directed. These will speed healing and prevent the condition from coming back.  If you are on blood thinners:  Get follow-up blood tests as directed by your health care provider.  Check with your health care provider before using any new medicines.  Carry a medical alert card or wear your medical alert jewelry to show that you are on blood thinners.  For phlebitis in the legs:  Avoid prolonged standing or bed rest.  Keep your legs moving. Raise your legs when sitting or lying.  Do not smoke.  Women, particularly those over the age of 64, should consider the risks and benefits  of taking the contraceptive pill. This kind of hormone treatment can increase your risk for blood clots.  Follow up with your health care provider as directed. SEEK MEDICAL CARE IF:   You have unusual bruising or any bleeding problems.  Your swelling or pain in the affected area is not improving.  You are on anti-inflammatory medicine, and you develop belly (abdominal) pain. SEEK  IMMEDIATE MEDICAL CARE IF:   You have a sudden onset of chest pain or difficulty breathing.  You have a fever or persistent symptoms for more than 2-3 days.  You have a fever and your symptoms suddenly get worse. MAKE SURE YOU:  Understand these instructions.  Will watch your condition.  Will get help right away if you are not doing well or get worse.   This information is not intended to replace advice given to you by your health care provider. Make sure you discuss any questions you have with your health care provider.   Document Released: 07/04/2001 Document Revised: 04/30/2013 Document Reviewed: 03/17/2013 Elsevier Interactive Patient Education [employer]

## 2015-07-21 NOTE — ED Notes (Signed)
Pt verbalizes understanding of d/c instructions and denies any further needs at this time. 

## 2015-07-21 NOTE — ED Provider Notes (Signed)
CSN: [id number]     Arrival date & time 07/21/15  0259 History   First MD Initiated Contact with Patient 07/21/15 [phone]     Chief Complaint  Patient presents with  . Arm Pain     (Consider location/radiation/quality/duration/timing/severity/associated sxs/prior Treatment) HPI Comments: Patient is a 34 year old male with recent arm injury that developed cellulitis. He was then seen by hand surgery and treated with antibiotics. He presents today with complaints of pain to the medial aspect of his right upper forearm. He denies any injury or trauma. He does feel a firm area to the forearm where his pain is.  Patient is a 34 y.o. male presenting with arm pain. The history is provided by the patient.  Arm Pain This is a new problem. The current episode started 2 days ago. The problem occurs constantly. The problem has been gradually worsening. Exacerbated by: Movement and palpation. Nothing relieves the symptoms. He has tried nothing for the symptoms. The treatment provided no relief.    Past Medical History  Diagnosis Date  . Hx of dizziness   . Gait disturbance   . Hypothyroidism   . Depression   . Dyslipidemia   . Back pain, chronic     low  . MS (multiple sclerosis) (HCC)     probable  . Testosterone deficiency   . Blood transfusion without reported diagnosis   . PONV (postoperative nausea and vomiting)     Nausea  . Anxiety   . Gynecomastia, male   . Snoring     "mild" sleep apnea. Pt was not required to wear CPAP machine, instead he uses nasal spray  . Vision abnormalities    Past Surgical History  Procedure Laterality Date  . Gynecomastia excision      X 2  . Gynecomastia mastectomy Bilateral 03/09/2014    Procedure: BILATERAL EXCISION GYNECOMASTIA;  Surgeon: Robyne Askew, MD;  Location: [facility] OR;  Service: General;  Laterality: Bilateral;  . Bunionectomy Right   . Ulnar nerve repair Left    Family History  Problem Relation Age of Onset  . Cancer Mother   . Cancer  Father   . Hypertension Father   . Stroke      Grandmother  . Hypertension Brother    Social History  Substance Use Topics  . Smoking status: Former Games developer  . Smokeless tobacco: Never Used  . Alcohol Use: 0.0 oz/week    0 Standard drinks or equivalent per week     Comment: social    Review of Systems  All other systems reviewed and are negative.     Allergies  Sulfa antibiotics  Home Medications   Prior to Admission medications   Medication Sig Start Date End Date Taking? Authorizing Provider  ALPRAZolam Prudy Feeler) 1 MG tablet Take 1 mg by mouth at bedtime as needed for sleep (take 2 tabs as needed at night).    Historical Provider, MD  amphetamine-dextroamphetamine (ADDERALL XR) 30 MG 24 hr capsule Take 30 mg by mouth daily. 07/12/14   Historical Provider, MD  Armodafinil (NUVIGIL) 250 MG tablet Take 1 tablet (250 mg total) by mouth daily. 02/24/15   Asa Lente, MD  cephALEXin (KEFLEX) 500 MG capsule TAKE ONE CAPSULE BY MOUTH 3 TIMES A DAY FOR 7 DAYS 02/03/15   Historical Provider, MD  clindamycin (CLEOCIN) 150 MG capsule Take 3 capsules (450 mg total) by mouth 3 (three) times daily. 06/23/15   Hannah Muthersbaugh, PA-C  Fingolimod HCl 0.5 MG CAPS Take 1  capsule (0.5 mg total) by mouth daily. 12/07/14   Asa Lente, MD  FLUoxetine (PROZAC) 20 MG capsule Take 20 mg by mouth daily. 07/06/14   Historical Provider, MD  gabapentin (NEURONTIN) 300 MG capsule ONE CAPSULE IN AM, ONE CAPSULE IN EVENING AND TWO AT BEDTIME 06/14/15   Asa Lente, MD  HYDROcodone-acetaminophen (NORCO) 7.5-325 MG per tablet as needed.  11/25/14   Historical Provider, MD  ibuprofen (ADVIL,MOTRIN) 800 MG tablet Take 800 mg by mouth every 6 (six) hours as needed.  12/05/13   Historical Provider, MD  levothyroxine (SYNTHROID, LEVOTHROID) 50 MCG tablet Take 50 mcg by mouth daily. 06/24/14   Historical Provider, MD  Multiple Vitamin (MULTIVITAMIN WITH MINERALS) TABS tablet Take 1 tablet by mouth daily.     Historical Provider, MD  ondansetron (ZOFRAN) 4 MG tablet Take 1 tablet (4 mg total) by mouth daily as needed for nausea or vomiting. 07/13/15   Asa Lente, MD  simvastatin (ZOCOR) 20 MG tablet Take 20 mg by mouth daily. 06/24/14   Historical Provider, MD  tadalafil (CIALIS) 20 MG tablet Take 1 tablet (20 mg total) by mouth daily as needed for erectile dysfunction. 04/20/15   Asa Lente, MD  traMADol (ULTRAM) 50 MG tablet Take 1 tablet (50 mg total) by mouth 3 (three) times daily as needed. 04/20/15   Asa Lente, MD   BP 127/88 mmHg  Pulse 84  Temp(Src) 97.9 F (36.6 C) (Oral)  Resp 20  Ht  (1.676 m)  Wt 165 lb (74.844 kg)  BMI 26.64 kg/m2  SpO2 98% Physical Exam  Constitutional: He is oriented to person, place, and time. He appears well-developed and well-nourished. No distress.  HENT:  Head: Normocephalic and atraumatic.  Neck: Normal range of motion. Neck supple.  Musculoskeletal:  The right upper extremity appears grossly normal with the exception of a tender superficial vein located near the elbow at the medial aspect of the proximal forearm. There is no surrounding erythema or warmth. Distal PMS is intact.  Neurological: He is alert and oriented to person, place, and time.  Skin: Skin is warm and dry. He is not diaphoretic.  Nursing note and vitals reviewed.   ED Course  Procedures (including critical care time) Labs Review Labs Reviewed - No data to display  Imaging Review No results found. I have personally reviewed and evaluated these images and lab results as part of my medical decision-making.   EKG Interpretation None      MDM   Final diagnoses:  None    I suspect this is a superficial thrombophlebitis. He has full range of motion of the elbow and I see no evidence for infection. This will be treated with warm compresses, anti-inflammatory, and follow-up with the hand surgeon. He has an appointment to see them next month and I advised him to  keep this appointment.    Geoffery Lyons, MD 07/21/15 [phone]

## 2015-07-21 NOTE — ED Notes (Signed)
Pt fell at work a month ago, dx'd with cellulitis, that is better, continues to complain of residual pain.  Has appointment with ortho in January.

## 2015-07-22 ENCOUNTER — Encounter: Payer: Self-pay | Admitting: Podiatry

## 2015-07-22 NOTE — Progress Notes (Signed)
DOS 04-28-15  Tailors bunionectomy right   Rx'd Percocet 10/325 #30, Keflex 500 mg TID #21, Phenergan 25 mg #30

## 2015-07-23 DIAGNOSIS — Z4889 Encounter for other specified surgical aftercare: Secondary | ICD-10-CM | POA: Diagnosis not present

## 2015-07-28 ENCOUNTER — Telehealth: Payer: Self-pay | Admitting: Neurology

## 2015-07-28 ENCOUNTER — Encounter: Payer: Self-pay | Admitting: *Deleted

## 2015-07-28 ENCOUNTER — Other Ambulatory Visit: Payer: Self-pay | Admitting: Podiatry

## 2015-07-28 ENCOUNTER — Telehealth: Payer: Self-pay | Admitting: *Deleted

## 2015-07-28 NOTE — Telephone Encounter (Signed)
He can come in today- I have meetings this afternoon but can come over

## 2015-07-28 NOTE — Telephone Encounter (Signed)
Letter printed, signed.  LMOM for pt. to call, let me know if he would like me to mail letter to him or if he would like to pick it up in the office/fim

## 2015-07-28 NOTE — Telephone Encounter (Addendum)
Contacted pt concerning the refill request for Cephalexin 500mg .  Pt states his foot is red, and painful, has an appt 07/30/2015.  Dr. Ardelle Anton states pt can come in today, or can take the Cephalexin and come in at scheduled appt 07/30/2015, or sooner if problem worsens.  Pt states he will take the Cephalexin and keep the appt 07/30/2015 and call if changes occur.

## 2015-07-28 NOTE — Telephone Encounter (Signed)
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

## 2015-07-29 NOTE — Telephone Encounter (Signed)
Pt called, he will pick up letter tomorrow morning

## 2015-07-29 NOTE — Telephone Encounter (Signed)
Noted.  Letter up front [facility] for pt. to pick up/fim

## 2015-07-30 ENCOUNTER — Encounter: Payer: Self-pay | Admitting: Podiatry

## 2015-07-30 ENCOUNTER — Encounter: Payer: Self-pay | Admitting: *Deleted

## 2015-07-30 ENCOUNTER — Ambulatory Visit (INDEPENDENT_AMBULATORY_CARE_PROVIDER_SITE_OTHER): Payer: BLUE CROSS/BLUE SHIELD

## 2015-07-30 ENCOUNTER — Ambulatory Visit (INDEPENDENT_AMBULATORY_CARE_PROVIDER_SITE_OTHER): Payer: BLUE CROSS/BLUE SHIELD | Admitting: Podiatry

## 2015-07-30 VITALS — BP 130/97 | HR 90 | Resp 12

## 2015-07-30 DIAGNOSIS — Z9889 Other specified postprocedural states: Secondary | ICD-10-CM

## 2015-07-30 NOTE — Telephone Encounter (Signed)
I have spoken with Jeremy [name].  He sts. 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  Per RAS ok, letter prepared as requested, signed, mailed to pt/fim

## 2015-07-30 NOTE — Telephone Encounter (Signed)
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

## 2015-08-02 ENCOUNTER — Encounter: Payer: Self-pay | Admitting: Podiatry

## 2015-08-02 NOTE — Progress Notes (Signed)
Patient ID: Jeremy [name], male   DOB: 1981-02-04, 35 y.o.   MRN: [id number]  Subjective: Jeremy [name] is a 35 y.o. is seen today in office s/p right HWR preformed on 07/23/15. They state their pain is improving. Taken to manage with 2 days after surgery and he did remove the bandage he presents today without any bandage over the incision site. He did call the other day stating that he fell his foot was infected and vascular refill Keflex which provided. He states he thought it was infected because of pain but not as any redness or red streaks or any drainage from the incision. Denies any systemic complaints such as fevers, chills, nausea, vomiting. No calf pain, chest pain, shortness of breath.   Objective: General: No acute distress, AAOx3  DP/PT pulses palpable 2/4, CRT < 3 sec to all digits.  Protective sensation intact. Motor function intact.  Right foot: Incision is well coapted without any evidence of dehiscence. There is a faint amount of surrounding erythema without any ascending cellulitis, fluctuance, crepitus, malodor, drainage/purulence. There is minimal edema around the surgical site. There is mild pain along the surgical site.  He discontinued to rule out words and his orthotic putting pressure the outside part of his foot which is wearing on the shoes. No other areas of tenderness to bilateral lower extremities.  No other open lesions or pre-ulcerative lesions.  No pain with calf compression, swelling, warmth, erythema.   Assessment and Plan:  Status post Right HWR, doing well with no complications   -Treatment options discussed including all alternatives, risks, and complications -Finish Keflex -Antibiotic ointment was placed over the incision followed by dry sterile dressing. Continue to the incision covered. -Ice/elevation -Pain medication as needed. -Monitor for any clinical signs or symptoms of infection and DVT/PE and directed to call the office immediately should any occur  or go to the ER. -A orthotic was modified to a lateral wedge to see if this helps stop supination. If this helps we'll reorder new orthotics. -Follow-up in 1 week for possible suture removal or sooner if any problems arise. In the meantime, encouraged to call the office with any questions, concerns, change in symptoms.   Ovid Curd, DPM

## 2015-08-06 ENCOUNTER — Encounter: Payer: Self-pay | Admitting: Neurology

## 2015-08-06 ENCOUNTER — Other Ambulatory Visit: Payer: Self-pay | Admitting: *Deleted

## 2015-08-06 ENCOUNTER — Telehealth: Payer: Self-pay | Admitting: Neurology

## 2015-08-06 MED ORDER — ARMODAFINIL 250 MG PO TABS: 250.0000 mg | ORAL_TABLET | Freq: Every day | ORAL | Status: DC

## 2015-08-06 NOTE — Telephone Encounter (Signed)
I have spoken with Jeremy [name].  I mailed signed letter last week--he has not received it.  He requested the last 2 letters signed and he will pick them up at the office today.  RAS has signed the last 2 letters and I have placed them up front [facility]./fim

## 2015-08-06 NOTE — Telephone Encounter (Signed)
Pt called said both letters needs to be signed. He would like to pick them up this afternoon. Please give him a call when it is ready

## 2015-08-13 ENCOUNTER — Ambulatory Visit (INDEPENDENT_AMBULATORY_CARE_PROVIDER_SITE_OTHER): Payer: BLUE CROSS/BLUE SHIELD | Admitting: Podiatry

## 2015-08-13 DIAGNOSIS — M779 Enthesopathy, unspecified: Secondary | ICD-10-CM | POA: Diagnosis not present

## 2015-08-13 DIAGNOSIS — M205X1 Other deformities of toe(s) (acquired), right foot: Secondary | ICD-10-CM

## 2015-08-13 DIAGNOSIS — Z9889 Other specified postprocedural states: Secondary | ICD-10-CM

## 2015-08-13 DIAGNOSIS — Z472 Encounter for removal of internal fixation device: Secondary | ICD-10-CM

## 2015-08-13 NOTE — Progress Notes (Signed)
Patient ID: Jeremy [name], male   DOB: [birth year], 35 y.o.   MRN: [id number]  Subjective: Jeremy [name] is a 35 y.o. is seen today in office s/p right HWR preformed on 07/23/15.  He states that he is doing well he is not having pain. He has been trying to wear the orthotic modified last appointment which does help by adding a lateral post. He has been changing the bandage daily to the incision and there is been no drainage or pus. No redness.  Denies any systemic complaints such as fevers, chills, nausea, vomiting. No calf pain, chest pain, shortness of breath.   Objective: General: No acute distress, AAOx3  DP/PT pulses palpable 2/4, CRT < 3 sec to all digits.  Protective sensation intact. Motor function intact.  Right foot: Incision is well coapted without any evidence of dehiscence and sutures intact. There is no surrounding erythema or ascending cellulitis, fluctuance, crepitus, malodor, drainage/purulence. There is minimal edema around the surgical site. There is decreased pain along the surgical site. No other areas of tenderness to bilateral lower extremities.  No other open lesions or pre-ulcerative lesions.  No pain with calf compression, swelling, warmth, erythema.   Assessment and Plan:  Status post Right HWR, doing well with no complications   -Treatment options discussed including all alternatives, risks, and complications -Sutures removed without incident. Antibiotic ointment was placed over the incision followed by dry sterile dressing. Continue to the incision covered. Can start to shower -Ice/elevation -Pain medication as needed. -He is requesting new orthotics. He was scanned for orthotics today and there were sent to [facility] labs. We will add a lateral post on both sides. He also purchased new shoes but he thinks that the wrong size he was measured today for shoe size. As well. -Monitor for any clinical signs or symptoms of infection and DVT/PE and directed to call the office  immediately should any occur or go to the ER. -Follow-up in 3 weeks or sooner if any problems arise. In the meantime, encouraged to call the office with any questions, concerns, change in symptoms.   Ovid Curd, DPM

## 2015-09-03 ENCOUNTER — Ambulatory Visit: Payer: BLUE CROSS/BLUE SHIELD | Admitting: *Deleted

## 2015-09-03 DIAGNOSIS — M779 Enthesopathy, unspecified: Secondary | ICD-10-CM

## 2015-09-03 NOTE — Progress Notes (Signed)
Patient ID: Jeremy [name], male   DOB: Sep 06, 1980, 35 y.o.   MRN: [id number] Patient presents for orthotic pick up accommodations are missing will send back to manufacturer for corrections.

## 2015-09-03 NOTE — Patient Instructions (Signed)

## 2015-09-16 ENCOUNTER — Other Ambulatory Visit: Payer: Self-pay | Admitting: *Deleted

## 2015-09-16 DIAGNOSIS — G35 Multiple sclerosis: Secondary | ICD-10-CM

## 2015-09-16 DIAGNOSIS — Z79899 Other long term (current) drug therapy: Secondary | ICD-10-CM

## 2015-09-17 ENCOUNTER — Other Ambulatory Visit (INDEPENDENT_AMBULATORY_CARE_PROVIDER_SITE_OTHER): Payer: Self-pay

## 2015-09-17 DIAGNOSIS — Z79899 Other long term (current) drug therapy: Secondary | ICD-10-CM

## 2015-09-17 DIAGNOSIS — G35 Multiple sclerosis: Secondary | ICD-10-CM

## 2015-09-17 DIAGNOSIS — Z0289 Encounter for other administrative examinations: Secondary | ICD-10-CM

## 2015-09-18 LAB — HEPATIC FUNCTION PANEL
ALK PHOS: 93 IU/L (ref 39–117)
ALT: 107 IU/L — ABNORMAL HIGH (ref 0–44)
AST: 46 IU/L — ABNORMAL HIGH (ref 0–40)
Albumin: 4.7 g/dL (ref 3.5–5.5)
Bilirubin Total: 0.8 mg/dL (ref 0.0–1.2)
Bilirubin, Direct: 0.18 mg/dL (ref 0.00–0.40)
TOTAL PROTEIN: 7.1 g/dL (ref 6.0–8.5)

## 2015-09-20 ENCOUNTER — Telehealth: Payer: Self-pay | Admitting: Neurology

## 2015-09-20 NOTE — Telephone Encounter (Signed)
I let Jeremy [name] know that the LFTs were actually a little bit higher this time than last time, despite being on every other day Gilenya. Also on simvastatin. He used to take it less regularly.  Cholesterol was is mildly elevated in the past (around 2:30)  He will stop the simvastatin. He has tried to change his diet to be more healthy. We will recheck his liver function tests in 2 months (has appt). If the liver tests are better, then the simvastatin is more likely to be playing a role than the Gilenya.

## 2015-10-08 ENCOUNTER — Ambulatory Visit: Payer: BLUE CROSS/BLUE SHIELD | Admitting: *Deleted

## 2015-10-08 DIAGNOSIS — R52 Pain, unspecified: Secondary | ICD-10-CM

## 2015-10-12 NOTE — Progress Notes (Signed)
Patient ID: Jeremy [name], male   DOB: Mar 31, 1981, 35 y.o.   MRN: [id number] Patient is complaining of continued discomfort with orthotics we will send back with request to increase arch bilateral, add a lateral denton and deepen heel cup significantly.

## 2015-11-02 ENCOUNTER — Other Ambulatory Visit: Payer: Self-pay | Admitting: *Deleted

## 2015-11-02 ENCOUNTER — Ambulatory Visit: Payer: BLUE CROSS/BLUE SHIELD | Admitting: *Deleted

## 2015-11-02 ENCOUNTER — Telehealth: Payer: Self-pay | Admitting: Neurology

## 2015-11-02 DIAGNOSIS — G35 Multiple sclerosis: Secondary | ICD-10-CM

## 2015-11-02 DIAGNOSIS — Z79899 Other long term (current) drug therapy: Secondary | ICD-10-CM

## 2015-11-02 DIAGNOSIS — R52 Pain, unspecified: Secondary | ICD-10-CM

## 2015-11-02 NOTE — Telephone Encounter (Signed)
LMTC. Also, it is time to recheck his liver profile.  Ordered in EPIC, and he can come in at  his convenience during reg. business hours. (although it is best to avoid 1130-1230)/fim

## 2015-11-02 NOTE — Telephone Encounter (Signed)
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  He would like a call back to relay exactly what he needs it to say.  Thanks!

## 2015-11-02 NOTE — Progress Notes (Signed)
Patient ID: Jeremy [name], male   DOB: 1980/09/12, 35 y.o.   MRN: [id number] Patient presents stating that he feels like he is still rolling out the lateral side of his left orthotic.  We will send back to manufacturer and increase the wedge and see if this will resolve the issue.  Will call patient when adjusted orthotic arrives.

## 2015-11-08 ENCOUNTER — Telehealth: Payer: Self-pay | Admitting: *Deleted

## 2015-11-08 MED ORDER — FINGOLIMOD HCL 0.5 MG PO CAPS: 0.5000 mg | ORAL_CAPSULE | Freq: Every day | ORAL | Status: DC

## 2015-11-08 NOTE — Telephone Encounter (Signed)
Gilenya escribed to [facility] per faxed request/fim

## 2015-11-10 ENCOUNTER — Encounter: Payer: Self-pay | Admitting: Neurology

## 2015-11-10 ENCOUNTER — Ambulatory Visit (INDEPENDENT_AMBULATORY_CARE_PROVIDER_SITE_OTHER): Payer: BLUE CROSS/BLUE SHIELD | Admitting: Neurology

## 2015-11-10 ENCOUNTER — Telehealth: Payer: Self-pay | Admitting: *Deleted

## 2015-11-10 VITALS — BP 118/74 | HR 70 | Resp 14 | Ht 66.0 in | Wt 175.0 lb

## 2015-11-10 DIAGNOSIS — R202 Paresthesia of skin: Secondary | ICD-10-CM

## 2015-11-10 DIAGNOSIS — R7989 Other specified abnormal findings of blood chemistry: Secondary | ICD-10-CM | POA: Insufficient documentation

## 2015-11-10 DIAGNOSIS — F418 Other specified anxiety disorders: Secondary | ICD-10-CM

## 2015-11-10 DIAGNOSIS — R5383 Other fatigue: Secondary | ICD-10-CM

## 2015-11-10 DIAGNOSIS — G35 Multiple sclerosis: Secondary | ICD-10-CM | POA: Diagnosis not present

## 2015-11-10 DIAGNOSIS — G4726 Circadian rhythm sleep disorder, shift work type: Secondary | ICD-10-CM | POA: Diagnosis not present

## 2015-11-10 DIAGNOSIS — Z79899 Other long term (current) drug therapy: Secondary | ICD-10-CM

## 2015-11-10 DIAGNOSIS — R269 Unspecified abnormalities of gait and mobility: Secondary | ICD-10-CM | POA: Diagnosis not present

## 2015-11-10 MED ORDER — NAPROXEN 500 MG PO TABS: 500.0000 mg | ORAL_TABLET | Freq: Two times a day (BID) | ORAL | Status: DC

## 2015-11-10 MED ORDER — AMPHETAMINE-DEXTROAMPHET ER 30 MG PO CP24: 30.0000 mg | ORAL_CAPSULE | Freq: Every day | ORAL | Status: DC

## 2015-11-10 MED ORDER — FLUOXETINE HCL 40 MG PO CAPS: 40.0000 mg | ORAL_CAPSULE | Freq: Every day | ORAL | Status: DC

## 2015-11-10 NOTE — Telephone Encounter (Signed)
-----   Message from Asa Lente, MD sent at 07/14/2015  1:22 PM EST ----- Please note that the liver function tests shows that the ALT is mildly elevated at 92 (less than 44 is normal). I can to stay on every other day and we will recheck in 2 months.

## 2015-11-10 NOTE — Telephone Encounter (Signed)
Pt. has f/u appt. today.  Can address labs and letter for dot physical further at appt/fim

## 2015-11-10 NOTE — Telephone Encounter (Signed)
Pt. was seen in the office today.  LFT's discussed and panel ordered to recheck/fim

## 2015-11-10 NOTE — Progress Notes (Signed)
[facility]  PATIENT: Jeremy [name] DOB: 03-Mar-1981  REFERRING CLINICIAN: Burton Apley  HISTORY FROM: patient REASON FOR VISIT: MS, needs to change therapy   HISTORICAL  CHIEF COMPLAINT:  Chief Complaint  Patient presents with  . Multiple Sclerosis    He continues to tolerate QOD Gilenya (elevated lft's) well.  Denies new or worsening sx./fim    HISTORY OF PRESENT ILLNESS:  Jeremy [name] is a 35 year old man with multiple sclerosis.   He is on Gilenya and tolerates it well.   He has not had any recent exacerbations. We will reduce the dose to every other day because of liver function elevation.  He also stopped Zocor.    His MRI 05/2015 was unchanged.      Gait/strength/sensation:  He reports a mild gait disturbance with ataxia but no weakness in his legs.  However, he has not fallen.   He has some numbness in his limbs, worse in left arm.   Vertigo:  He has had some vertigo spells but these are less than earlier in year.     Bladder:  He has mild urinary frequency but this is not bad enough to treat. He has 0-1 nocturia.   He was also having some erectile dysfunction.    Cialis helped much better than Viagra.       Farigue/sleep:   He has fatigue most days, physical more than mental. He notes that he works third shift and the transition can be difficult at times. He usually goes to bed at 5 or 6 in the mornings.   Nuvigil and Adderalll have helped.   However, on days he has off, he will often go to bed at 1 in the morning to try to keep a schedule more like those around him.    He denies sleepiness.    He takes one xanax at night.     Mood:   He has had some depression and was placed on Prozac with benefit. We discussed increasing to 40 mg daily as he still notes anxiety and some mood swings.   We also discussed that it might make ED worse.    Ulnar nerve:     He had his ulnar nerve transposition recently and everything went well.    He had cellulitis recently   and was on clindamycin  MS History:   He was diagnosed in January 2012.   In 2009, he had a several week episode of right hand clumsiness associated with some electric shock sensations. He did not see a doctor about the symptoms. In December 2011, he had the onset of altered taste. A week later, he had nausea and dizziness and over the next couple days these symptoms worsened and he developed right-sided clumsiness and slurred speech. First toward his PCP who felt he might have an ear infection and he was referred to ENT. ENT evaluated him and referred him to Dr. Anne Hahn at [facility] who noted an ataxic gait and ordered an MRI of the spine and brain. Those studies were consistent with multiple sclerosis and he underwent a lumbar puncture in April 2011 that was also consistent with MS. He was started on Rebif in June 2011 and remain on the medicine until September 2014 when a repeat MRI showed the interval development of several more lesions. He was switched to [place] but insurance would no longer cover it so we switched to Gilenya early 2016.     MRIs from 05/25/2014 show a moderate size focus  in the left temporal lobe and another 10 fairly small periventricular and deep white matter foci. MRI of the cervical spine showed 2 foci, one at C4 and one at C6. There were no enhancing lesions on those MRIs.  REVIEW OF SYSTEMS:  Constitutional: No fevers, chills, sweats, or change in appetite Eyes: No visual changes, double vision, eye pain Ear, nose and throat: No hearing loss, ear pain, nasal congestion, sore throat Cardiovascular: No chest pain, palpitations Respiratory:  No shortness of breath at rest or with exertion.   No wheezes GastrointestinaI: No nausea, vomiting, diarrhea, abdominal pain, fecal incontinence Genitourinary:  No dysuria, urinary retention or frequency.  No nocturia. Musculoskeletal:  No neck pain, back pain Integumentary: No rash, pruritus, skin lesions Neurological: as  above Psychiatric: No depression at this time.  No anxiety Endocrine: No palpitations, diaphoresis, change in appetite, change in weigh or increased thirst Hematologic/Lymphatic:  No anemia, purpura, petechiae. Allergic/Immunologic: No itchy/runny eyes, nasal congestion, recent allergic reactions, rashes  ALLERGIES: Allergies  Allergen Reactions  . Sulfa Antibiotics Nausea Only    HOME MEDICATIONS: Outpatient Prescriptions Prior to Visit  Medication Sig Dispense Refill  . ALPRAZolam (XANAX) 1 MG tablet Take 1 mg by mouth at bedtime as needed for sleep (take 2 tabs as needed at night).    . Armodafinil (NUVIGIL) 250 MG tablet Take 1 tablet (250 mg total) by mouth daily. 30 tablet 5  . cephALEXin (KEFLEX) 500 MG capsule TAKE ONE CAPSULE BY MOUTH 3 TIMES A DAY 30 capsule 2  . clindamycin (CLEOCIN) 150 MG capsule Take 3 capsules (450 mg total) by mouth 3 (three) times daily. 90 capsule 0  . Fingolimod HCl 0.5 MG CAPS Take 1 capsule (0.5 mg total) by mouth daily. 90 capsule 3  . gabapentin (NEURONTIN) 300 MG capsule ONE CAPSULE IN AM, ONE CAPSULE IN EVENING AND TWO AT BEDTIME 120 capsule 5  . HYDROcodone-acetaminophen (NORCO) 7.5-325 MG per tablet as needed.   0  . levothyroxine (SYNTHROID, LEVOTHROID) 50 MCG tablet Take 50 mcg by mouth daily.  4  . Multiple Vitamin (MULTIVITAMIN WITH MINERALS) TABS tablet Take 1 tablet by mouth daily.    . ondansetron (ZOFRAN) 4 MG tablet Take 1 tablet (4 mg total) by mouth daily as needed for nausea or vomiting. 30 tablet 5  . tadalafil (CIALIS) 20 MG tablet Take 1 tablet (20 mg total) by mouth daily as needed for erectile dysfunction. 4 tablet 11  . traMADol (ULTRAM) 50 MG tablet Take 1 tablet (50 mg total) by mouth 3 (three) times daily as needed. 90 tablet 4  . amphetamine-dextroamphetamine (ADDERALL XR) 30 MG 24 hr capsule Take 30 mg by mouth daily.  0  . FLUoxetine (PROZAC) 20 MG capsule Take 20 mg by mouth daily.  3  . ibuprofen (ADVIL,MOTRIN) 800 MG  tablet Take 800 mg by mouth every 6 (six) hours as needed.     . naproxen (NAPROSYN) 500 MG tablet Take 1 tablet (500 mg total) by mouth 2 (two) times daily. 20 tablet 0  . simvastatin (ZOCOR) 20 MG tablet Take 20 mg by mouth daily.  4   Facility-Administered Medications Prior to Visit  Medication Dose Route Frequency Provider Last Rate Last Dose  . gadopentetate dimeglumine (MAGNEVIST) injection 15 mL  15 mL Intravenous Once PRN Asa Lente, MD        PAST MEDICAL HISTORY: Past Medical History  Diagnosis Date  . Hx of dizziness   . Gait disturbance   . Hypothyroidism   .  Depression   . Dyslipidemia   . Back pain, chronic     low  . MS (multiple sclerosis) (HCC)     probable  . Testosterone deficiency   . Blood transfusion without reported diagnosis   . PONV (postoperative nausea and vomiting)     Nausea  . Anxiety   . Gynecomastia, male   . Snoring     "mild" sleep apnea. Pt was not required to wear CPAP machine, instead he uses nasal spray  . Vision abnormalities     PAST SURGICAL HISTORY: Past Surgical History  Procedure Laterality Date  . Gynecomastia excision      X 2  . Gynecomastia mastectomy Bilateral 03/09/2014    Procedure: BILATERAL EXCISION GYNECOMASTIA;  Surgeon: Robyne Askew, MD;  Location: [facility] OR;  Service: General;  Laterality: Bilateral;  . Bunionectomy Right   . Ulnar nerve repair Left     FAMILY HISTORY: Family History  Problem Relation Age of Onset  . Cancer Mother   . Cancer Father   . Hypertension Father   . Stroke      Grandmother  . Hypertension Brother     SOCIAL HISTORY:  Social History   Social History  . Marital Status: Single    Spouse Name: N/A  . Number of Children: 0  . Years of Education: College    Occupational History  . Chef     Social History Main Topics  . Smoking status: Former Games developer  . Smokeless tobacco: Never Used  . Alcohol Use: 0.0 oz/week    0 Standard drinks or equivalent per week     Comment:  social  . Drug Use: No  . Sexual Activity: Not on file   Other Topics Concern  . 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    Patient has a 4 year college education.    Patient has no children.            PHYSICAL EXAM  Filed Vitals:   11/10/15 1302  BP: 118/74  Pulse: 70  Resp: 14  Height:  (1.676 m)  Weight: 175 lb (79.379 kg)    Body mass index is 28.26 kg/(m^2).   General: The patient is well-developed and well-nourished and in no acute distress  Skin: Extremities are without significant edema.  Neurologic Exam  Mental status: The patient is alert and oriented x 3 at the time of the examination. The patient has apparent normal recent and remote memory, with an apparently normal attention span and concentration ability.   Speech is normal.  Cranial nerves: Extraocular movements are full.  There is good facial sensation to soft touch bilaterally.Facial strength is normal.  Trapezius and sternocleidomastoid strength is normal. No dysarthria is noted.   No obvious hearing deficits are noted.  Motor:  Muscle bulk and tone are normal. Strength is  5 / 5 in all 4 extremities.   Sensory: Sensory testing is intact to pinprick, soft touch, vibration sensation, and position sense on all 4 extremities.  Coordination: Cerebellar testing reveals mildly reduced right finger-nose-finger and heel-to-shin.  Gait and station: Station and gait are normal. Tandem gait is wide. Romberg is negative.   Reflexes: Deep tendon reflexes are increased in legs with spread at both knees.        DIAGNOSTIC DATA (LABS, IMAGING, TESTING) - I reviewed patient records, labs, notes, testing and imaging myself where available.  Lab Results  Component Value Date   WBC 3.3* 07/13/2015   HGB 14.9 03/02/2014   HCT 45.7 07/13/2015   MCV 91 07/13/2015   PLT 184 07/13/2015      Component Value Date/Time   NA 142 03/02/2014 1342   K 4.9 03/02/2014  1342   CL 104 03/02/2014 1342   CO2 26 03/02/2014 1342   GLUCOSE 82 03/02/2014 1342   BUN 15 03/02/2014 1342   CREATININE 0.86 03/02/2014 1342   CALCIUM 9.6 03/02/2014 1342   PROT 7.1 09/17/2015 1101   ALBUMIN 4.7 09/17/2015 1101   AST 46* 09/17/2015 1101   ALT 107* 09/17/2015 1101   ALKPHOS 93 09/17/2015 1101   BILITOT 0.8 09/17/2015 1101   GFRNONAA >90 03/02/2014 1342   GFRAA >90 03/02/2014 1342      ASSESSMENT AND PLAN  Multiple sclerosis (HCC)  Abnormality of gait  High risk medication use  Paresthesia  Low serum vitamin D  Depression with anxiety  Other fatigue  Shift work sleep disorder   1.   Continue Gilenya qod.   Check labs 2.   Naproxen for pain 3.   Adderall individual for fatigue and shift work disorder    Cialis for ED.  4.   He will continue to be active and exercises as tolerated. Return in 4 months or sooner if there are new or worsening neurologic symptoms.  Amesha Bailey A. Epimenio Foot, MD, PhD 11/10/2015, 1:32 PM Certified in Neurology, Clinical Neurophysiology, Sleep Medicine, Pain Medicine and Neuroimaging  [facility] [address] [place], [place] [address] [phone]

## 2015-11-11 ENCOUNTER — Telehealth: Payer: Self-pay | Admitting: *Deleted

## 2015-11-11 LAB — CBC WITH DIFFERENTIAL/PLATELET
BASOS: 0 %
Basophils Absolute: 0 10*3/uL (ref 0.0–0.2)
EOS (ABSOLUTE): 0 10*3/uL (ref 0.0–0.4)
Eos: 1 %
HEMATOCRIT: 44.1 % (ref 37.5–51.0)
Hemoglobin: 15.3 g/dL (ref 12.6–17.7)
IMMATURE GRANULOCYTES: 0 %
Immature Grans (Abs): 0 10*3/uL (ref 0.0–0.1)
LYMPHS ABS: 0.4 10*3/uL — AB (ref 0.7–3.1)
Lymphs: 10 %
MCH: 31 pg (ref 26.6–33.0)
MCHC: 34.7 g/dL (ref 31.5–35.7)
MCV: 90 fL (ref 79–97)
Monocytes Absolute: 0.3 10*3/uL (ref 0.1–0.9)
Monocytes: 6 %
NEUTROS PCT: 83 %
Neutrophils Absolute: 3.4 10*3/uL (ref 1.4–7.0)
PLATELETS: 181 10*3/uL (ref 150–379)
RBC: 4.93 x10E6/uL (ref 4.14–5.80)
RDW: 13.5 % (ref 12.3–15.4)
WBC: 4.1 10*3/uL (ref 3.4–10.8)

## 2015-11-11 LAB — HEPATIC FUNCTION PANEL
ALK PHOS: 88 IU/L (ref 39–117)
ALT: 47 IU/L — ABNORMAL HIGH (ref 0–44)
AST: 23 IU/L (ref 0–40)
Albumin: 4.7 g/dL (ref 3.5–5.5)
BILIRUBIN, DIRECT: 0.17 mg/dL (ref 0.00–0.40)
Bilirubin Total: 0.7 mg/dL (ref 0.0–1.2)
Total Protein: 6.9 g/dL (ref 6.0–8.5)

## 2015-11-11 LAB — VITAMIN D 25 HYDROXY (VIT D DEFICIENCY, FRACTURES): Vit D, 25-Hydroxy: 26.4 ng/mL — ABNORMAL LOW (ref 30.0–100.0)

## 2015-11-11 NOTE — Telephone Encounter (Signed)
-----   Message from Asa Lente, MD sent at 11/11/2015  1:13 PM EDT ----- Blood work looks good. The one liver test is very minimally high but much lower than it was last time.  Vitamin D is just a little bit and also he can take 5000 units OTC daily.

## 2015-11-11 NOTE — Telephone Encounter (Signed)
I have spoken with Jeremy [name] this afternoon and per RAS, advised that labs look better than last time--the one test that was high last time is still minimally high, but much better now.  Vit. D is slightly low; he should take otc vit. d 5,000iu daily.  He verbalized understanding of same/fim

## 2015-11-16 NOTE — Telephone Encounter (Signed)
Patient is calling and states that [facility] says they have not gotten the Rx Gilenya. Phone # [phone].  Thanks!

## 2015-11-16 NOTE — Telephone Encounter (Signed)
Gilenya rx. was escribed to [facility] on 11-08-15.  I have spoken with Tiffany at [facility] and verified that they have rx. from 11-08-15.  I have spoken with Jonny Ruiz and let him know they have rx. and will begin processing it./fim

## 2015-12-13 ENCOUNTER — Encounter: Payer: Self-pay | Admitting: *Deleted

## 2015-12-14 ENCOUNTER — Encounter: Payer: Self-pay | Admitting: *Deleted

## 2015-12-17 ENCOUNTER — Other Ambulatory Visit: Payer: Self-pay | Admitting: Neurology

## 2016-01-31 ENCOUNTER — Telehealth: Payer: Self-pay | Admitting: Neurology

## 2016-01-31 NOTE — Telephone Encounter (Signed)
Patient requesting refill of Armodafinil (NUVIGIL) 250 MG tablet  Pharmacy: [facility]

## 2016-02-01 MED ORDER — ARMODAFINIL 250 MG PO TABS: 250.0000 mg | ORAL_TABLET | Freq: Every day | ORAL | Status: DC

## 2016-02-01 NOTE — Telephone Encounter (Signed)
Rx. awaiting RAS sig/fim 

## 2016-02-01 NOTE — Telephone Encounter (Signed)
Nuvigil rx. faxed to [facility] [place], with fax confirmation received/fim

## 2016-03-10 ENCOUNTER — Other Ambulatory Visit: Payer: Self-pay | Admitting: Neurology

## 2016-03-10 ENCOUNTER — Telehealth: Payer: Self-pay | Admitting: Neurology

## 2016-03-10 NOTE — Telephone Encounter (Signed)
Rn call patient back to state he was given 11 refills in 03/2015. Rn stated his refills are not out. Rn stated they are good till 03/2016.Pt verbalized understanding.

## 2016-03-10 NOTE — Telephone Encounter (Signed)
Patient requesting refill of viagra  Pharmacy: [facility]

## 2016-03-13 ENCOUNTER — Ambulatory Visit (INDEPENDENT_AMBULATORY_CARE_PROVIDER_SITE_OTHER): Payer: BLUE CROSS/BLUE SHIELD | Admitting: Neurology

## 2016-03-13 ENCOUNTER — Encounter: Payer: Self-pay | Admitting: Neurology

## 2016-03-13 VITALS — BP 112/76 | HR 70 | Resp 12 | Ht 66.0 in | Wt 179.5 lb

## 2016-03-13 DIAGNOSIS — R202 Paresthesia of skin: Secondary | ICD-10-CM | POA: Diagnosis not present

## 2016-03-13 DIAGNOSIS — Z79899 Other long term (current) drug therapy: Secondary | ICD-10-CM

## 2016-03-13 DIAGNOSIS — G35 Multiple sclerosis: Secondary | ICD-10-CM | POA: Diagnosis not present

## 2016-03-13 DIAGNOSIS — F418 Other specified anxiety disorders: Secondary | ICD-10-CM | POA: Diagnosis not present

## 2016-03-13 DIAGNOSIS — R5383 Other fatigue: Secondary | ICD-10-CM

## 2016-03-13 DIAGNOSIS — G5622 Lesion of ulnar nerve, left upper limb: Secondary | ICD-10-CM

## 2016-03-13 MED ORDER — VENLAFAXINE HCL ER 75 MG PO CP24: 75.0000 mg | ORAL_CAPSULE | Freq: Every day | ORAL | 11 refills | Status: DC

## 2016-03-13 MED ORDER — PROMETHAZINE HCL 12.5 MG PO TABS: 12.5000 mg | ORAL_TABLET | Freq: Four times a day (QID) | ORAL | 0 refills | Status: DC | PRN

## 2016-03-13 MED ORDER — TADALAFIL 20 MG PO TABS: 20.0000 mg | ORAL_TABLET | Freq: Every day | ORAL | 11 refills | Status: DC | PRN

## 2016-03-13 MED ORDER — AMPHETAMINE-DEXTROAMPHET ER 30 MG PO CP24: 30.0000 mg | ORAL_CAPSULE | Freq: Every day | ORAL | 0 refills | Status: DC

## 2016-03-13 NOTE — Progress Notes (Signed)
[facility]  PATIENT: Jeremy [name] DOB: 1981-01-18  REFERRING CLINICIAN: Burton Apley  HISTORY FROM: patient REASON FOR VISIT: MS, needs to change therapy   HISTORICAL  CHIEF COMPLAINT:  Chief Complaint  Patient presents with  . Multiple Sclerosis    Sts. he continues to tolerate Gilenya well.  Feels his memory is worse--he would like to discuss Ocrelizumab/fim    HISTORY OF PRESENT ILLNESS:  Jeremy [name] is a 35 year old man with multiple sclerosis.   He is on Gilenya and tolerates it well.   He has not had any recent exacerbations.    He is on qod Gilenya as liver tests were elevated.    We will reduce the dose to every other day because of liver function elevation.  He also stopped Zocor.    His MRI 05/2015 was unchanged.      Gait/strength/sensation:  He reports no dificult with gait.    gait disturbance with ataxia but no weakness in his legs.  However, he has not fallen.   He has some numbness in his limbs, worse in left arm.   Vertigo:  He has had some vertigo spells but these are less than earlier in year.   This happens if he spins (recently skating0  Bladder:  He has mild urinary frequency but this is not bad enough to treat. He has 0-1 nocturia.   He was also having some erectile dysfunction.    Cialis helped better than Viagra.       Farigue/sleep:   He has fatigue most days, physical more than mental. He notes that he works third shift and the transition can be difficult at times. He usually goes to bed at 5 or 6 in the mornings.   Nuvigil and Adderalll have helped.   However, on days he has off, he will often go to bed at 1 in the morning to try to keep a schedule more like those around him.    He denies sleepiness.    He takes one xanax at night.     Mood:   He has some depression and was placed on Prozac with some benefit. It helps his symptoms partially. However, ED is worse.    Ulnar nerve:     He had his ulnar nerve transposition recently  and everything went well.    He had cellulitis recently  and was on clindamycin  MS History:   He was diagnosed in January 2012.   In 2009, he had a several week episode of right hand clumsiness associated with some electric shock sensations. He did not see a doctor about the symptoms. In December 2011, he had the onset of altered taste. A week later, he had nausea and dizziness and over the next couple days these symptoms worsened and he developed right-sided clumsiness and slurred speech. First toward his PCP who felt he might have an ear infection and he was referred to ENT. ENT evaluated him and referred him to Dr. Anne Hahn at [facility] who noted an ataxic gait and ordered an MRI of the spine and brain. Those studies were consistent with multiple sclerosis and he underwent a lumbar puncture in April 2011 that was also consistent with MS. He was started on Rebif in June 2011 and remain on the medicine until September 2014 when a repeat MRI showed the interval development of several more lesions. He was switched to [place] but insurance would no longer cover it so we switched to Gilenya early 2016.  MRIs from 05/25/2014 show a moderate size focus in the left temporal lobe and another 10 fairly small periventricular and deep white matter foci. MRI of the cervical spine showed 2 foci, one at C4 and one at C6. There were no enhancing lesions on those MRIs.  REVIEW OF SYSTEMS:  Constitutional: No fevers, chills, sweats, or change in appetite Eyes: No visual changes, double vision, eye pain Ear, nose and throat: No hearing loss, ear pain, nasal congestion, sore throat Cardiovascular: No chest pain, palpitations Respiratory:  No shortness of breath at rest or with exertion.   No wheezes GastrointestinaI: No nausea, vomiting, diarrhea, abdominal pain, fecal incontinence Genitourinary:  No dysuria, urinary retention or frequency.  No nocturia. Musculoskeletal:  No neck pain, back  pain Integumentary: No rash, pruritus, skin lesions Neurological: as above Psychiatric: No depression at this time.  No anxiety Endocrine: No palpitations, diaphoresis, change in appetite, change in weigh or increased thirst Hematologic/Lymphatic:  No anemia, purpura, petechiae. Allergic/Immunologic: No itchy/runny eyes, nasal congestion, recent allergic reactions, rashes  ALLERGIES: Allergies  Allergen Reactions  . Sulfa Antibiotics Nausea Only    HOME MEDICATIONS: Outpatient Medications Prior to Visit  Medication Sig Dispense Refill  . ALPRAZolam (XANAX) 1 MG tablet Take 1 mg by mouth at bedtime as needed for sleep (take 2 tabs as needed at night).    . Armodafinil (NUVIGIL) 250 MG tablet Take 1 tablet (250 mg total) by mouth daily. 30 tablet 5  . Fingolimod HCl 0.5 MG CAPS Take 1 capsule (0.5 mg total) by mouth daily. 90 capsule 3  . HYDROcodone-acetaminophen (NORCO) 7.5-325 MG per tablet as needed.   0  . levothyroxine (SYNTHROID, LEVOTHROID) 50 MCG tablet Take 50 mcg by mouth daily.  4  . Multiple Vitamin (MULTIVITAMIN WITH MINERALS) TABS tablet Take 1 tablet by mouth daily.    . naproxen (NAPROSYN) 500 MG tablet Take 1 tablet (500 mg total) by mouth 2 (two) times daily. 180 tablet 1  . ondansetron (ZOFRAN) 4 MG tablet Take 1 tablet (4 mg total) by mouth daily as needed for nausea or vomiting. 30 tablet 5  . amphetamine-dextroamphetamine (ADDERALL XR) 30 MG 24 hr capsule Take 1 capsule (30 mg total) by mouth daily. 30 capsule 0  . cephALEXin (KEFLEX) 500 MG capsule TAKE ONE CAPSULE BY MOUTH 3 TIMES A DAY 30 capsule 2  . clindamycin (CLEOCIN) 150 MG capsule Take 3 capsules (450 mg total) by mouth 3 (three) times daily. 90 capsule 0  . tadalafil (CIALIS) 20 MG tablet Take 1 tablet (20 mg total) by mouth daily as needed for erectile dysfunction. 4 tablet 11  . traMADol (ULTRAM) 50 MG tablet Take 1 tablet (50 mg total) by mouth 3 (three) times daily as needed. 90 tablet 4  . gabapentin  (NEURONTIN) 300 MG capsule ONE CAPSULE IN AM, ONE CAPSULE IN EVENING AND TWO AT BEDTIME (Patient not taking: Reported on 03/13/2016) 120 capsule 5  . FLUoxetine (PROZAC) 40 MG capsule Take 1 capsule (40 mg total) by mouth daily. 90 capsule 4   Facility-Administered Medications Prior to Visit  Medication Dose Route Frequency Provider Last Rate Last Dose  . gadopentetate dimeglumine (MAGNEVIST) injection 15 mL  15 mL Intravenous Once PRN Asa Lente, MD        PAST MEDICAL HISTORY: Past Medical History:  Diagnosis Date  . Anxiety   . Back pain, chronic    low  . Blood transfusion without reported diagnosis   . Depression   . Dyslipidemia   .  Gait disturbance   . Gynecomastia, male   . Hx of dizziness   . Hypothyroidism   . MS (multiple sclerosis) (HCC)    probable  . PONV (postoperative nausea and vomiting)    Nausea  . Snoring    "mild" sleep apnea. Pt was not required to wear CPAP machine, instead he uses nasal spray  . Testosterone deficiency   . Vision abnormalities     PAST SURGICAL HISTORY: Past Surgical History:  Procedure Laterality Date  . BUNIONECTOMY Right   . GYNECOMASTIA EXCISION     X 2  . GYNECOMASTIA MASTECTOMY Bilateral 03/09/2014   Procedure: BILATERAL EXCISION GYNECOMASTIA;  Surgeon: Robyne Askew, MD;  Location: [facility] OR;  Service: General;  Laterality: Bilateral;  . ULNAR NERVE REPAIR Left     FAMILY HISTORY: Family History  Problem Relation Age of Onset  . Cancer Mother   . Cancer Father   . Hypertension Father   . Stroke      Grandmother  . Hypertension Brother     SOCIAL HISTORY:  Social History   Social History  . Marital status: Single    Spouse name: N/A  . Number of children: 0  . Years of education: College    Occupational History  . Chef     Social History Main Topics  . Smoking status: Former Games developer  . Smokeless tobacco: Never Used  . Alcohol use 0.0 oz/week     Comment: social  . Drug use: No  . Sexual activity:  Not on file   Other Topics Concern  . 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    Patient has a 4 year college education.    Patient has no children.            PHYSICAL EXAM  Vitals:   03/13/16 1047  BP: 112/76  Pulse: 70  Resp: 12  Weight: 179 lb 8 oz (81.4 kg)  Height: 5\' 6"  (1.676 m)    Body mass index is 28.97 kg/m.   General: The patient is well-developed and well-nourished and in no acute distress  Skin: Extremities are without rash or edema.  Neurologic Exam  Mental status: The patient is alert and oriented x 3 at the time of the examination. The patient has apparent normal recent and remote memory, with an apparently normal attention span and concentration ability.   Speech is normal.  Cranial nerves: Extraocular movements are full.  There is good facial sensation to soft touch bilaterally.Facial strength is normal.  Trapezius and sternocleidomastoid strength is normal. No dysarthria is noted.   No obvious hearing deficits are noted.  Motor:  Muscle bulk and tone are normal. Strength is  5 / 5 in all 4 extremities.   Sensory: Sensory testing is intact to pinprick, soft touch, vibration sensation, and position sense on all 4 extremities.  Coordination: Cerebellar testing reveals mildly reduced right finger-nose-finger and heel-to-shin.  Gait and station: Station and gait are normal. Tandem gait is slightly wide. Romberg is negative.   Reflexes: Deep tendon reflexes are increased in legs with spread at both knees.        DIAGNOSTIC DATA (LABS, IMAGING, TESTING) - I reviewed patient records, labs, notes, testing and imaging myself where available.  Lab Results  Component Value Date   WBC 4.1 11/10/2015   HGB 14.9 03/02/2014   HCT 44.1 11/10/2015   MCV 90 11/10/2015   PLT 181  11/10/2015      Component Value Date/Time   NA 142 03/02/2014 1342   K 4.9 03/02/2014 1342   CL 104 03/02/2014 1342   CO2 26  03/02/2014 1342   GLUCOSE 82 03/02/2014 1342   BUN 15 03/02/2014 1342   CREATININE 0.86 03/02/2014 1342   CALCIUM 9.6 03/02/2014 1342   PROT 6.9 11/10/2015 1343   ALBUMIN 4.7 11/10/2015 1343   AST 23 11/10/2015 1343   ALT 47 (H) 11/10/2015 1343   ALKPHOS 88 11/10/2015 1343   BILITOT 0.7 11/10/2015 1343   GFRNONAA >90 03/02/2014 1342   GFRAA >90 03/02/2014 1342      ASSESSMENT AND PLAN  Multiple sclerosis (HCC) - Plan: CBC with Differential/Platelet, Hepatitis B surface antigen, Quantiferon tb gold assay (blood), MR Brain W Wo Contrast, Comprehensive metabolic panel, Hepatitis B surface antibody, Hepatitis B core antibody, total  Paresthesia  High risk medication use - Plan: CBC with Differential/Platelet, Hepatitis B surface antigen, Quantiferon tb gold assay (blood), MR Brain W Wo Contrast, Comprehensive metabolic panel, Hepatitis B surface antibody, Hepatitis B core antibody, total  Ulnar nerve entrapment, left  Depression with anxiety  Other fatigue  1.   Jeremy [name] is a 35 year old man with relapsing remitting multiple sclerosis.   He would like to start ocrelizumab. Check labs for chronic hepatitis, TB, CMP and CBC and he signed the service request form. I will like him to have a 6-8 week washout from Gilenya and he will stop today. Check labs 2.   Naproxen for pain 3.   Adderall and Nuvigil for fatigue and shift work disorder    Cialis for ED.   Marland Kitchen. Change from Prozac to Effexor to see if that is better for the depression with last ED. 4.   He will continue to be active and exercises as tolerated. 5.   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 Return in 4 months or sooner if there are new or worsening neurologic symptoms.  40 minutes face-to-face evaluation with greater than one half of the time counseling and coordinating care about his MS,  disease modifying therapies and symptoms treatment.   Ronniesha Seibold A. Epimenio FootSater, MD, PhD 03/13/2016, 12:59 PM Certified in Neurology, Clinical Neurophysiology, Sleep Medicine, Pain Medicine and Neuroimaging  [facility] [address], [address] [place], [place] [address] [phone]

## 2016-03-14 LAB — CBC WITH DIFFERENTIAL/PLATELET
BASOS ABS: 0 10*3/uL (ref 0.0–0.2)
Basos: 0 %
EOS (ABSOLUTE): 0 10*3/uL (ref 0.0–0.4)
Eos: 1 %
Hematocrit: 46.7 % (ref 37.5–51.0)
Hemoglobin: 15.5 g/dL (ref 12.6–17.7)
Immature Grans (Abs): 0.1 10*3/uL (ref 0.0–0.1)
Immature Granulocytes: 2 %
LYMPHS ABS: 0.4 10*3/uL — AB (ref 0.7–3.1)
Lymphs: 10 %
MCH: 30.5 pg (ref 26.6–33.0)
MCHC: 33.2 g/dL (ref 31.5–35.7)
MCV: 92 fL (ref 79–97)
MONOCYTES: 8 %
MONOS ABS: 0.3 10*3/uL (ref 0.1–0.9)
NEUTROS ABS: 3.2 10*3/uL (ref 1.4–7.0)
Neutrophils: 79 %
Platelets: 169 10*3/uL (ref 150–379)
RBC: 5.08 x10E6/uL (ref 4.14–5.80)
RDW: 13.5 % (ref 12.3–15.4)
WBC: 4.1 10*3/uL (ref 3.4–10.8)

## 2016-03-14 LAB — COMPREHENSIVE METABOLIC PANEL
ALK PHOS: 98 IU/L (ref 39–117)
ALT: 101 IU/L — AB (ref 0–44)
AST: 38 IU/L (ref 0–40)
Albumin/Globulin Ratio: 1.8 (ref 1.2–2.2)
Albumin: 4.6 g/dL (ref 3.5–5.5)
BUN / CREAT RATIO: 18 (ref 9–20)
BUN: 14 mg/dL (ref 6–20)
Bilirubin Total: 0.6 mg/dL (ref 0.0–1.2)
CO2: 23 mmol/L (ref 18–29)
Calcium: 10 mg/dL (ref 8.7–10.2)
Chloride: 97 mmol/L (ref 96–106)
Creatinine, Ser: 0.77 mg/dL (ref 0.76–1.27)
GFR calc Af Amer: 135 mL/min/{1.73_m2} (ref >59)
GFR calc non Af Amer: 117 mL/min/{1.73_m2} (ref >59)
GLUCOSE: 89 mg/dL (ref 65–99)
Globulin, Total: 2.6 g/dL (ref 1.5–4.5)
POTASSIUM: 4.5 mmol/L (ref 3.5–5.2)
Sodium: 138 mmol/L (ref 134–144)
Total Protein: 7.2 g/dL (ref 6.0–8.5)

## 2016-03-14 LAB — HEPATITIS B CORE ANTIBODY, TOTAL: Hep B Core Total Ab: NEGATIVE

## 2016-03-14 LAB — HEPATITIS B SURFACE ANTIGEN: HEP B S AG: NEGATIVE

## 2016-03-14 LAB — HEPATITIS B SURFACE ANTIBODY,QUALITATIVE: HEP B SURFACE AB, QUAL: NONREACTIVE

## 2016-03-15 ENCOUNTER — Telehealth: Payer: Self-pay | Admitting: *Deleted

## 2016-03-15 ENCOUNTER — Ambulatory Visit (INDEPENDENT_AMBULATORY_CARE_PROVIDER_SITE_OTHER): Payer: BLUE CROSS/BLUE SHIELD

## 2016-03-15 DIAGNOSIS — G35 Multiple sclerosis: Secondary | ICD-10-CM | POA: Diagnosis not present

## 2016-03-15 DIAGNOSIS — Z79899 Other long term (current) drug therapy: Secondary | ICD-10-CM

## 2016-03-15 LAB — QUANTIFERON IN TUBE
QFT TB AG MINUS NIL VALUE: 0 [IU]/mL
QUANTIFERON MITOGEN VALUE: 0.74 [IU]/mL
QUANTIFERON NIL VALUE: 0.02 [IU]/mL
QUANTIFERON TB AG VALUE: 0.02 IU/mL
QUANTIFERON TB GOLD: NEGATIVE

## 2016-03-15 LAB — QUANTIFERON TB GOLD ASSAY (BLOOD)

## 2016-03-15 NOTE — Telephone Encounter (Signed)
I have spoken with Beuford this afternoon and per RAS, advised labs are ok for Ocrelizumab.  He verbalized understanding of same.  I have completed his Ocrelizumab srf and put it in Tina's inbox in the infusion suite.  Vedder will continue Gilenya until [employer] has been approved, then stop Gilenya.  He must be off of Gilenya for 6 weeks prior to first Ocrelizumab infusion./fim

## 2016-03-15 NOTE — Telephone Encounter (Signed)
-----   Message from Asa Lente, MD sent at 03/15/2016  2:24 PM EDT ----- Lab work is fine and if we have not already done so, we can send in the ocrelizumab form. He should be off Gilenya for 6-8 weeks before getting the first ocrelizumab dose.

## 2016-03-16 MED ORDER — GADOPENTETATE DIMEGLUMINE 469.01 MG/ML IV SOLN: 16.0000 mL | Freq: Once | INTRAVENOUS | Status: DC | PRN

## 2016-03-17 ENCOUNTER — Other Ambulatory Visit: Payer: Self-pay | Admitting: Neurology

## 2016-03-17 ENCOUNTER — Telehealth: Payer: Self-pay | Admitting: Neurology

## 2016-03-17 ENCOUNTER — Telehealth: Payer: Self-pay | Admitting: *Deleted

## 2016-03-17 NOTE — Telephone Encounter (Signed)
I have spoken with Jeremy RuizJohn this afternoon, and per RAS, advised that MRI was ok--no new MS changes.  He verbalized understanding of same.  Sts. cholesterol was 330.  He had to stop Simvastatin due to increased lft's (both Gilenya and Simvastatin can affect the liver).  Due to high cholesterol, per RAS, pt. advised to stop Gilenya and restart Simvastatin.  He will be switching to [employer] Financialcrelizumab

## 2016-03-17 NOTE — Telephone Encounter (Signed)
-----   Message from Asa Lente, MD sent at 03/17/2016 11:35 AM EDT ----- Please let him know that the MRI looks good. There are no new findings.

## 2016-03-20 NOTE — Telephone Encounter (Signed)
I have spoken with Jeremy [name] and advised that per RAS, MRI brain showed no new changes.  He verbalized understanding of same/fim

## 2016-03-20 NOTE — Telephone Encounter (Signed)
-----   Message from Asa Lente, MD sent at 03/20/2016  4:48 PM EDT ----- Please let him know that the MRI of the brain does not show any new findings.

## 2016-04-10 ENCOUNTER — Other Ambulatory Visit: Payer: Self-pay | Admitting: Neurology

## 2016-04-23 ENCOUNTER — Other Ambulatory Visit: Payer: Self-pay | Admitting: Neurology

## 2016-04-25 ENCOUNTER — Telehealth: Payer: Self-pay

## 2016-04-25 MED ORDER — VENLAFAXINE HCL ER 75 MG PO CP24: 75.0000 mg | ORAL_CAPSULE | Freq: Every day | ORAL | 3 refills | Status: DC

## 2016-04-25 NOTE — Telephone Encounter (Signed)
90 day refill request

## 2016-05-01 ENCOUNTER — Other Ambulatory Visit: Payer: Self-pay | Admitting: Neurology

## 2016-06-11 ENCOUNTER — Other Ambulatory Visit: Payer: Self-pay | Admitting: Neurology

## 2016-07-03 ENCOUNTER — Encounter: Payer: Self-pay | Admitting: *Deleted

## 2016-07-03 ENCOUNTER — Telehealth: Payer: Self-pay | Admitting: Neurology

## 2016-07-03 NOTE — Telephone Encounter (Signed)
I have spoken with Aireon this afternoon.  He sts. the DOT would only give him a 6 mo. physical and wants to know if this is legal.  I do not know the answer to this question--this is a legal question he would need to address to an attorney.  F/U appt. with RAS made during this call/fim

## 2016-07-03 NOTE — Telephone Encounter (Signed)
Patient is calling to discuss a letter that Dr. Epimenio FootSater had written before regarding DOT.

## 2016-07-18 ENCOUNTER — Emergency Department (HOSPITAL_BASED_OUTPATIENT_CLINIC_OR_DEPARTMENT_OTHER)
Admission: EM | Admit: 2016-07-18 | Discharge: 2016-07-18 | Disposition: A | Discharge status: Home / Self Care | Payer: BLUE CROSS/BLUE SHIELD | Attending: Emergency Medicine | Admitting: Emergency Medicine

## 2016-07-18 ENCOUNTER — Emergency Department (HOSPITAL_BASED_OUTPATIENT_CLINIC_OR_DEPARTMENT_OTHER): Payer: BLUE CROSS/BLUE SHIELD

## 2016-07-18 ENCOUNTER — Encounter (HOSPITAL_BASED_OUTPATIENT_CLINIC_OR_DEPARTMENT_OTHER): Payer: Self-pay

## 2016-07-18 DIAGNOSIS — Z79899 Other long term (current) drug therapy: Secondary | ICD-10-CM | POA: Insufficient documentation

## 2016-07-18 DIAGNOSIS — E039 Hypothyroidism, unspecified: Secondary | ICD-10-CM | POA: Insufficient documentation

## 2016-07-18 DIAGNOSIS — Z791 Long term (current) use of non-steroidal anti-inflammatories (NSAID): Secondary | ICD-10-CM | POA: Diagnosis not present

## 2016-07-18 DIAGNOSIS — S9032XA Contusion of left foot, initial encounter: Secondary | ICD-10-CM

## 2016-07-18 DIAGNOSIS — S99922A Unspecified injury of left foot, initial encounter: Secondary | ICD-10-CM | POA: Diagnosis present

## 2016-07-18 DIAGNOSIS — Z87891 Personal history of nicotine dependence: Secondary | ICD-10-CM | POA: Diagnosis not present

## 2016-07-18 DIAGNOSIS — Y929 Unspecified place or not applicable: Secondary | ICD-10-CM | POA: Insufficient documentation

## 2016-07-18 DIAGNOSIS — W228XXA Striking against or struck by other objects, initial encounter: Secondary | ICD-10-CM | POA: Insufficient documentation

## 2016-07-18 DIAGNOSIS — Y939 Activity, unspecified: Secondary | ICD-10-CM | POA: Insufficient documentation

## 2016-07-18 DIAGNOSIS — Y999 Unspecified external cause status: Secondary | ICD-10-CM | POA: Diagnosis not present

## 2016-07-18 NOTE — Discharge Instructions (Signed)
Return to the ED with any concerns including worsening pain, swelling, numbness of foot or toes, or any other alarming symptoms  You should apply ice for no more than 20 minutes at a time for the first 24 hours, ,keep foot elevated as much as possible and take ibuprofen every 6-8 hours for discomfort

## 2016-07-18 NOTE — ED Triage Notes (Signed)
Pt reports hitting his left foot against the dishwasher last night. Painful to walk.

## 2016-07-18 NOTE — ED Provider Notes (Signed)
[facility] Note   CSN: [id number] Arrival date & time: 07/18/16  0759     History   Chief Complaint Chief Complaint  Patient presents with  . Foot Pain    HPI Jeremy [name] is a 35 y.o. male.  HPI  Pt presenting with c/o left foot pain.  He states he hit his foot on the diswasher that was open.  The pain is on the top of his foot.  He does have some pain with walking but is able to bear weight.  Pain is worse with palpation.  No other areas of pain.  Did not fall.  Pt has not had any treatment prior to arrival.  There are no other associated systemic symptoms, there are no other alleviating or modifying factors.   Past Medical History:  Diagnosis Date  . Anxiety   . Back pain, chronic    low  . Blood transfusion without reported diagnosis   . Depression   . Dyslipidemia   . Gait disturbance   . Gynecomastia, male   . Hx of dizziness   . Hypothyroidism   . MS (multiple sclerosis) (HCC)    probable  . PONV (postoperative nausea and vomiting)    Nausea  . Snoring    "mild" sleep apnea. Pt was not required to wear CPAP machine, instead he uses nasal spray  . Testosterone deficiency   . Vision abnormalities     Patient Active Problem List   Diagnosis Date Noted  . Low serum vitamin D 11/10/2015  . Depression with anxiety 04/20/2015  . Low testosterone 04/20/2015  . Paresthesia 12/09/2014  . Ulnar nerve entrapment 12/09/2014  . High risk medication use 12/09/2014  . Pronation deformity of both feet 12/07/2014  . Metatarsal deformity 12/07/2014  . Metatarsalgia of both feet 12/07/2014  . Bradycardia, sinus 09/30/2014  . Other fatigue 09/30/2014  . Shift work sleep disorder 08/25/2014  . Erectile disorder due to medical condition in male patient 08/25/2014  . Ataxic gait 08/25/2014  . Subareolar gynecomastia in male 01/19/2014  . Multiple sclerosis (HCC) 03/10/2013  . Abnormality of gait 03/10/2013    Past Surgical History:  Procedure  Laterality Date  . BUNIONECTOMY Right   . GYNECOMASTIA EXCISION     X 2  . GYNECOMASTIA MASTECTOMY Bilateral 03/09/2014   Procedure: BILATERAL EXCISION GYNECOMASTIA;  Surgeon: Robyne Askew, MD;  Location: [facility] OR;  Service: General;  Laterality: Bilateral;  . ULNAR NERVE REPAIR Left        Home Medications    Prior to Admission medications   Medication Sig Start Date End Date Taking? Authorizing Provider  ALPRAZolam Prudy Feeler) 1 MG tablet Take 1 mg by mouth at bedtime as needed for sleep (take 2 tabs as needed at night).   Yes Historical Provider, MD  amphetamine-dextroamphetamine (ADDERALL XR) 30 MG 24 hr capsule Take 1 capsule (30 mg total) by mouth daily. 03/13/16  Yes Asa Lente, MD  HYDROcodone-acetaminophen (NORCO) 7.5-325 MG per tablet as needed.  11/25/14  Yes Historical Provider, MD  levothyroxine (SYNTHROID, LEVOTHROID) 50 MCG tablet Take 50 mcg by mouth daily. 06/24/14  Yes Historical Provider, MD  naproxen (NAPROSYN) 500 MG tablet Take 1 tablet (500 mg total) by mouth 2 (two) times daily. 11/10/15  Yes Asa Lente, MD  ocrelizumab 600 mg in sodium chloride 0.9 % 500 mL Inject 600 mg into the vein every 6 (six) months.   Yes Historical Provider, MD  ondansetron (ZOFRAN) 4 MG  tablet Take 1 tablet (4 mg total) by mouth daily as needed for nausea or vomiting. 07/13/15  Yes Asa Lenteichard A Sater, MD  promethazine (PHENERGAN) 12.5 MG tablet TAKE 1 TABLET (12.5 MG TOTAL) BY MOUTH EVERY 6 (SIX) HOURS AS NEEDED FOR NAUSEA OR VOMITING. 06/12/16  Yes Asa Lenteichard A Sater, MD  simvastatin (ZOCOR) 20 MG tablet Take 20 mg by mouth daily.   Yes Historical Provider, MD  tadalafil (CIALIS) 20 MG tablet Take 1 tablet (20 mg total) by mouth daily as needed for erectile dysfunction. 03/13/16  Yes Asa Lenteichard A Sater, MD  venlafaxine XR (EFFEXOR XR) 75 MG 24 hr capsule Take 1 capsule (75 mg total) by mouth daily with breakfast. 04/25/16  Yes Asa Lenteichard A Sater, MD  Armodafinil (NUVIGIL) 250 MG tablet Take 1 tablet  (250 mg total) by mouth daily. 02/01/16   Asa Lenteichard A Sater, MD  Fingolimod HCl 0.5 MG CAPS Take 1 capsule (0.5 mg total) by mouth daily. 11/08/15   Asa Lenteichard A Sater, MD  gabapentin (NEURONTIN) 300 MG capsule ONE CAPSULE IN AM, ONE CAPSULE IN EVENING AND TWO AT BEDTIME Patient not taking: Reported on 03/13/2016 12/17/15   Asa Lenteichard A Sater, MD  Multiple Vitamin (MULTIVITAMIN WITH MINERALS) TABS tablet Take 1 tablet by mouth daily.    Historical Provider, MD    Family History Family History  Problem Relation Age of Onset  . Cancer Mother   . Cancer Father   . Hypertension Father   . Stroke      Grandmother  . Hypertension Brother     Social History Social History  Substance Use Topics  . Smoking status: Former Games developermoker  . Smokeless tobacco: Never Used  . Alcohol use 0.0 oz/week     Comment: social     Allergies   Sulfa antibiotics   Review of Systems Review of Systems  ROS reviewed and all otherwise negative except for mentioned in HPI   Physical Exam Updated Vital Signs BP 113/68 (BP Location: Right Arm)   Pulse 78   Temp 97.5 F (36.4 C) (Oral)   Resp 18   Ht 5\' 6"  (1.676 m)   Wt 185 lb (83.9 kg)   SpO2 97%   BMI 29.86 kg/m  Vitals reviewed Physical Exam Physical Examination: General appearance - alert, well appearing, and in no distress Mental status - alert, oriented to person, place, and time Eyes - no conjunctival injection, no scleral icterus Chest - clear to auscultation, no wheezes, rales or rhonchi, symmetric air entry Neurological - alert, oriented, sensation and strength of flexion and extension intact in left distal foot Musculoskeletal - no joint tenderness, deformity or swelling, ttp over dorsum of left foot with small contusion Extremities - peripheral pulses normal, no pedal edema, no clubbing or cyanosis Skin - normal coloration and turgor, no rashes  ED Treatments / Results  Labs (all labs ordered are listed, but only abnormal results are  displayed) Labs Reviewed - No data to display  EKG  EKG Interpretation None       Radiology Dg Foot Complete Left  Result Date: 07/18/2016 CLINICAL DATA:  Hit top of foot on dishwasher last night, pain, initial encounter. EXAM: LEFT FOOT - COMPLETE 3+ VIEW COMPARISON:  None. FINDINGS: Old fifth metatarsal fracture. No acute osseous abnormality. Mild degenerative changes at the first metatarsophalangeal joint. IMPRESSION: 1. No acute osseous abnormality. 2. Mild first metatarsal phalangeal joint osteoarthritis. Electronically Signed   By: Leanna BattlesMelinda  Blietz M.D.   On: 07/18/2016 08:45  Procedures Procedures (including critical care time)  Medications Ordered in ED Medications - No data to display   Initial Impression / Assessment and Plan / ED Course  I have reviewed the triage vital signs and the nursing notes.  Pertinent labs & imaging results that were available during my care of the patient were reviewed by me and considered in my medical decision making (see chart for details).  Clinical Course     Pt presenting with pain in left top of foot after hitting it on a dishwasher last night.  Xray is reassuring.  Foot is NVI.  Advised ice, elevation, nsaids for contusion.  Discharged with strict return precautions.  Pt agreeable with plan.  Final Clinical Impressions(s) / ED Diagnoses   Final diagnoses:  Contusion of left foot, initial encounter    New Prescriptions Discharge Medication List as of 07/18/2016  9:07 AM       Jerelyn Scott, MD 07/18/16 1311

## 2016-08-11 ENCOUNTER — Ambulatory Visit (INDEPENDENT_AMBULATORY_CARE_PROVIDER_SITE_OTHER): Payer: BLUE CROSS/BLUE SHIELD | Admitting: Neurology

## 2016-08-11 ENCOUNTER — Encounter: Payer: Self-pay | Admitting: Neurology

## 2016-08-11 VITALS — BP 119/77 | HR 84 | Resp 14 | Ht 66.0 in | Wt 188.5 lb

## 2016-08-11 DIAGNOSIS — G35 Multiple sclerosis: Secondary | ICD-10-CM

## 2016-08-11 DIAGNOSIS — N521 Erectile dysfunction due to diseases classified elsewhere: Secondary | ICD-10-CM

## 2016-08-11 DIAGNOSIS — R269 Unspecified abnormalities of gait and mobility: Secondary | ICD-10-CM | POA: Diagnosis not present

## 2016-08-11 DIAGNOSIS — G4726 Circadian rhythm sleep disorder, shift work type: Secondary | ICD-10-CM

## 2016-08-11 DIAGNOSIS — F418 Other specified anxiety disorders: Secondary | ICD-10-CM

## 2016-08-11 DIAGNOSIS — G35D Multiple sclerosis, unspecified: Secondary | ICD-10-CM

## 2016-08-11 DIAGNOSIS — R5383 Other fatigue: Secondary | ICD-10-CM

## 2016-08-11 MED ORDER — SILDENAFIL CITRATE 100 MG PO TABS
100.0000 mg | ORAL_TABLET | Freq: Every day | ORAL | 3 refills | Status: DC | PRN
Start: 2016-08-11 — End: 2017-10-11

## 2016-08-11 MED ORDER — ARMODAFINIL 250 MG PO TABS
250.0000 mg | ORAL_TABLET | Freq: Every day | ORAL | 5 refills | Status: DC
Start: 2016-08-11 — End: 2017-01-25

## 2016-08-11 MED ORDER — AMPHETAMINE-DEXTROAMPHET ER 30 MG PO CP24: 30.0000 mg | ORAL_CAPSULE | Freq: Every day | ORAL | 0 refills | Status: DC

## 2016-08-11 MED ORDER — NAPROXEN 500 MG PO TABS: 500.0000 mg | ORAL_TABLET | Freq: Two times a day (BID) | ORAL | 3 refills | Status: DC

## 2016-08-11 MED ORDER — PROMETHAZINE HCL 12.5 MG PO TABS: 12.5000 mg | ORAL_TABLET | Freq: Four times a day (QID) | ORAL | 1 refills | Status: DC | PRN

## 2016-08-11 NOTE — Progress Notes (Signed)
[facility]  PATIENT: Jeremy [name] DOB: 1981-07-20  REFERRING CLINICIAN: Burton Apley  HISTORY FROM: patient REASON FOR VISIT: MS, needs to change therapy   HISTORICAL  CHIEF COMPLAINT:  Chief Complaint  Patient presents with  . Multiple Sclerosis    Has had parts A and B of first Ocrelizumab infusion and is due for his 6 mo. infusion.  Sts. he feels great; denies new or worsening sx./fim    HISTORY OF PRESENT ILLNESS:  Jeremy [name] is a 36 year old man with multiple sclerosis.     MS:   He switched to ocrelizumab from [place] and tolerates it well.   He has not had any recent exacerbations.    He is on qod [place] as liver tests were elevated.     Gait/strength/sensation:  He reports milddificulty with gait with ataxia but no weakness in his legs.  However, he has not fallen.  He sometimes has trouble walking further distances.  He has some numbness in his limbs, worse in left arm.   Vertigo:  He has had some vertigo spells but these are less than earlier in year.    Bladder:  He has mild urinary frequency and 0-1 nocturia. This is not bad enough to treat.    He was also having some erectile dysfunction.    Viagra helps better than Cialis.  Fatigue/sleep:   He has fatigue most days, physical more than mental. 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    He gets 5-6 hours of sleep.  Nuvigil and Adderalll have helped.   He denies sleepiness.    He takes one xanax at night.     Mood:   He has some depression and was placed on Prozac with some benefit. It helps his symptoms partially.      Ulnar nerve:     He had his ulnar nerve transposition recently and everything went well.      MS History:   He was diagnosed in January 2012.   In 2009, he had a several week episode of right hand clumsiness associated with some electric shock sensations. He did not see a doctor about the symptoms. In December 2011, he had the onset of  altered taste. A week later, he had nausea and dizziness and over the next couple days these symptoms worsened and he developed right-sided clumsiness and slurred speech. First toward his PCP who felt he might have an ear infection and he was referred to ENT. ENT evaluated him and referred him to Dr. Anne Hahn at [facility] who noted an ataxic gait and ordered an MRI of the spine and brain. Those studies were consistent with multiple sclerosis and he underwent a lumbar puncture in April 2011 that was also consistent with MS. He was started on Rebif in June 2011 and remain on the medicine until September 2014 when a repeat MRI showed the interval development of several more lesions. He was switched to [place] but insurance would no longer cover it so we switched to Gilenya early 2016.     MRIs from 05/25/2014 show a moderate size focus in the left temporal lobe and another 10 fairly small periventricular and deep white matter foci. MRI of the cervical spine showed 2 foci, one at C4 and one at C6. There were no enhancing lesions on those MRIs.  REVIEW OF SYSTEMS:  Constitutional: No fevers, chills, sweats, or change in appetite.   Has fatigue,  shift work issues Eyes: No visual changes, double vision, eye pain Ear, nose and throat: No hearing loss, ear pain, nasal congestion, sore throat Cardiovascular: No chest pain, palpitations Respiratory:  No shortness of breath at rest or with exertion.   No wheezes GastrointestinaI: No nausea, vomiting, diarrhea, abdominal pain, fecal incontinence Genitourinary:  No dysuria, urinary retention.  Mild frequency and nocturia.   Has ED Musculoskeletal:  No neck pain, back pain Integumentary: No rash, pruritus, skin lesions Neurological: as above Psychiatric: No depression at this time on med;s.  No anxiety Endocrine: No palpitations, diaphoresis, change in appetite, change in weigh or increased thirst Hematologic/Lymphatic:  No anemia, purpura,  petechiae. Allergic/Immunologic: No itchy/runny eyes, nasal congestion, recent allergic reactions, rashes  ALLERGIES: Allergies  Allergen Reactions  . Sulfa Antibiotics Nausea Only    HOME MEDICATIONS: Outpatient Medications Prior to Visit  Medication Sig Dispense Refill  . ALPRAZolam (XANAX) 1 MG tablet Take 1 mg by mouth at bedtime as needed for sleep (take 2 tabs as needed at night).    Marland Kitchen HYDROcodone-acetaminophen (NORCO) 7.5-325 MG per tablet as needed.   0  . levothyroxine (SYNTHROID, LEVOTHROID) 50 MCG tablet Take 50 mcg by mouth daily.  4  . Multiple Vitamin (MULTIVITAMIN WITH MINERALS) TABS tablet Take 1 tablet by mouth daily.    Marland Kitchen ocrelizumab 600 mg in sodium chloride 0.9 % 500 mL Inject 600 mg into the vein every 6 (six) months.    . ondansetron (ZOFRAN) 4 MG tablet Take 1 tablet (4 mg total) by mouth daily as needed for nausea or vomiting. 30 tablet 5  . simvastatin (ZOCOR) 20 MG tablet Take 20 mg by mouth daily.    . tadalafil (CIALIS) 20 MG tablet Take 1 tablet (20 mg total) by mouth daily as needed for erectile dysfunction. 4 tablet 11  . venlafaxine XR (EFFEXOR XR) 75 MG 24 hr capsule Take 1 capsule (75 mg total) by mouth daily with breakfast. 90 capsule 3  . amphetamine-dextroamphetamine (ADDERALL XR) 30 MG 24 hr capsule Take 1 capsule (30 mg total) by mouth daily. 30 capsule 0  . Armodafinil (NUVIGIL) 250 MG tablet Take 1 tablet (250 mg total) by mouth daily. 30 tablet 5  . naproxen (NAPROSYN) 500 MG tablet Take 1 tablet (500 mg total) by mouth 2 (two) times daily. 180 tablet 1  . promethazine (PHENERGAN) 12.5 MG tablet TAKE 1 TABLET (12.5 MG TOTAL) BY MOUTH EVERY 6 (SIX) HOURS AS NEEDED FOR NAUSEA OR VOMITING. 30 tablet 1  . Fingolimod HCl 0.5 MG CAPS Take 1 capsule (0.5 mg total) by mouth daily. 90 capsule 3  . gabapentin (NEURONTIN) 300 MG capsule ONE CAPSULE IN AM, ONE CAPSULE IN EVENING AND TWO AT BEDTIME (Patient not taking: Reported on 03/13/2016) 120 capsule 5    Facility-Administered Medications Prior to Visit  Medication Dose Route Frequency Provider Last Rate Last Dose  . gadopentetate dimeglumine (MAGNEVIST) injection 15 mL  15 mL Intravenous Once PRN Asa Lente, MD      . gadopentetate dimeglumine (MAGNEVIST) injection 16 mL  16 mL Intravenous Once PRN Asa Lente, MD        PAST MEDICAL HISTORY: Past Medical History:  Diagnosis Date  . Anxiety   . Back pain, chronic    low  . Blood transfusion without reported diagnosis   . Depression   . Dyslipidemia   . Gait disturbance   . Gynecomastia, male   . Hx of dizziness   . Hypothyroidism   .  MS (multiple sclerosis) (HCC)    probable  . PONV (postoperative nausea and vomiting)    Nausea  . Snoring    "mild" sleep apnea. Pt was not required to wear CPAP machine, instead he uses nasal spray  . Testosterone deficiency   . Vision abnormalities     PAST SURGICAL HISTORY: Past Surgical History:  Procedure Laterality Date  . BUNIONECTOMY Right   . GYNECOMASTIA EXCISION     X 2  . GYNECOMASTIA MASTECTOMY Bilateral 03/09/2014   Procedure: BILATERAL EXCISION GYNECOMASTIA;  Surgeon: Robyne Askew, MD;  Location: [facility] OR;  Service: General;  Laterality: Bilateral;  . ULNAR NERVE REPAIR Left     FAMILY HISTORY: Family History  Problem Relation Age of Onset  . Cancer Mother   . Cancer Father   . Hypertension Father   . Stroke      Grandmother  . Hypertension Brother     SOCIAL HISTORY:  Social History   Social History  . Marital status: Single    Spouse name: N/A  . Number of children: 0  . Years of education: College    Occupational History  . Chef     Social History Main Topics  . Smoking status: Former Games developer  . Smokeless tobacco: Never Used  . Alcohol use 0.0 oz/week     Comment: social  . Drug use: No  . Sexual activity: Not on file   Other Topics Concern  . 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     Patient has a 4 year college education.    Patient has no children.            PHYSICAL EXAM  Vitals:   08/11/16 0828  BP: 119/77  Pulse: 84  Resp: 14  Weight: 188 lb 8 oz (85.5 kg)  Height: 5\' 6"  (1.676 m)    Body mass index is 30.42 kg/m.   General: The patient is well-developed and well-nourished and in no acute distress  Skin: Extremities are without rash or edema.  Neurologic Exam  Mental status: The patient is alert and oriented x 3 at the time of the examination. The patient has apparent normal recent and remote memory, with an apparently normal attention span and concentration ability.   Speech is normal.  Cranial nerves: Extraocular movements are full.  There is good facial sensation to soft touch bilaterally.Facial strength is normal.  Trapezius and sternocleidomastoid strength is normal. No dysarthria is noted.   No obvious hearing deficits are noted.  Motor:  Muscle bulk and tone are normal. Strength is  5 / 5 in all 4 extremities.   Sensory: Sensory testing is intact to pinprick, soft touch, vibration sensation, and position sense on all 4 extremities.  Coordination: Cerebellar testing reveals mildly reduced right finger-nose-finger and heel-to-shin.  Gait and station: Station and gait are normal. Tandem gait is slightly wide. Romberg is negative.   Reflexes: Deep tendon reflexes are increased in legs with spread at both knees.        DIAGNOSTIC DATA (LABS, IMAGING, TESTING) - I reviewed patient records, labs, notes, testing and imaging myself where available.  Lab Results  Component Value Date   WBC 4.1 03/13/2016   HGB 14.9 03/02/2014   HCT 46.7 03/13/2016   MCV 92 03/13/2016   PLT 169 03/13/2016      Component Value Date/Time   NA 138 03/13/2016 1129   K 4.5 03/13/2016  1129   CL 97 03/13/2016 1129   CO2 23 03/13/2016 1129   GLUCOSE 89 03/13/2016 1129   GLUCOSE 82 03/02/2014 1342   BUN 14 03/13/2016 1129   CREATININE 0.77 03/13/2016 1129    CALCIUM 10.0 03/13/2016 1129   PROT 7.2 03/13/2016 1129   ALBUMIN 4.6 03/13/2016 1129   AST 38 03/13/2016 1129   ALT 101 (H) 03/13/2016 1129   ALKPHOS 98 03/13/2016 1129   BILITOT 0.6 03/13/2016 1129   GFRNONAA 117 03/13/2016 1129   GFRAA 135 03/13/2016 1129      ASSESSMENT AND PLAN  Multiple sclerosis (HCC)  Abnormality of gait  Shift work sleep disorder  Other fatigue  Erectile disorder due to medical condition in male patient  Depression with anxiety  1.   Schedule next ocrelizumab infusion  2.   Renew Naproxen, viagra and phenergan.   3.   Renew Adderall and Nuvigil for fatigue and shift work disorder     4.   He will continue to be active and exercises as tolerated. 5.   Mr. 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  He has fatigue that affects walking endurance.,    Return in 6 months or sooner if there are new or worsening neurologic symptoms.    Jesyca Weisenburger A. Epimenio Foot, MD, PhD 08/11/2016, 8:57 AM Certified in Neurology, Clinical Neurophysiology, Sleep Medicine, Pain Medicine and Neuroimaging  [facility] [address] [place], [place] [address] [phone]

## 2016-10-05 ENCOUNTER — Ambulatory Visit: Payer: BLUE CROSS/BLUE SHIELD | Admitting: Podiatry

## 2016-10-19 ENCOUNTER — Ambulatory Visit: Payer: BLUE CROSS/BLUE SHIELD | Admitting: Podiatry

## 2016-10-26 ENCOUNTER — Ambulatory Visit (INDEPENDENT_AMBULATORY_CARE_PROVIDER_SITE_OTHER): Payer: 59 | Admitting: Podiatry

## 2016-10-26 ENCOUNTER — Encounter: Payer: Self-pay | Admitting: Podiatry

## 2016-10-26 DIAGNOSIS — M216X1 Other acquired deformities of right foot: Secondary | ICD-10-CM

## 2016-10-26 DIAGNOSIS — M722 Plantar fascial fibromatosis: Secondary | ICD-10-CM | POA: Diagnosis not present

## 2016-10-26 DIAGNOSIS — M216X2 Other acquired deformities of left foot: Secondary | ICD-10-CM | POA: Diagnosis not present

## 2016-10-27 NOTE — Progress Notes (Signed)
Subjective: 36 year old male presents the office today requesting new orthotics. He has full tear answers but he'll that she try an answer with more arch support. He is having no pain to his foot at this time however he states that after he walks for more than an hour standing for more than an hour he gets pain to the arch. He states the surgical sites are doing very well and is pleased with the outcome. Denies any systemic complaints such as fevers, chills, nausea, vomiting. No acute changes since last appointment, and no other complaints at this time.   Objective: AAO x3, NAD DP/PT pulses palpable bilaterally, CRT less than 3 seconds Incision sites from previous surgery well healed. There is no tenderness this areas. There is a decrease in medial arch upon weightbearing. There is no area of tenderness identified today and there is no overlying edema, erythema, increase in warmth. Subjectively on the medial arch of the plantar fascia is were he gets tenderness hobbies hallux. Tenderness today. No open lesions or pre-ulcerative lesions.  No pain with calf compression, swelling, warmth, erythema  Assessment: 36 year old male with arch pain/plantar fasciitis  Plan: -All treatment options discussed with the patient including all alternatives, risks, complications.  -He was measured for new orthotics today. Will do him with a more arch support to see if this will help. I will send that in 3 weeks to pick up inserts or sooner if needed.  -Patient encouraged to call the office with any questions, concerns, change in symptoms.   Ovid Curd, DPM

## 2016-11-07 DIAGNOSIS — N3001 Acute cystitis with hematuria: Secondary | ICD-10-CM | POA: Diagnosis not present

## 2016-11-15 DIAGNOSIS — N341 Nonspecific urethritis: Secondary | ICD-10-CM | POA: Diagnosis not present

## 2016-11-16 ENCOUNTER — Other Ambulatory Visit: Payer: 59

## 2016-11-17 DIAGNOSIS — G35 Multiple sclerosis: Secondary | ICD-10-CM | POA: Diagnosis not present

## 2016-12-01 DIAGNOSIS — Z0000 Encounter for general adult medical examination without abnormal findings: Secondary | ICD-10-CM | POA: Diagnosis not present

## 2016-12-29 DIAGNOSIS — Z0000 Encounter for general adult medical examination without abnormal findings: Secondary | ICD-10-CM | POA: Diagnosis not present

## 2017-01-01 DIAGNOSIS — S46912A Strain of unspecified muscle, fascia and tendon at shoulder and upper arm level, left arm, initial encounter: Secondary | ICD-10-CM | POA: Diagnosis not present

## 2017-01-19 ENCOUNTER — Telehealth: Payer: Self-pay | Admitting: Neurology

## 2017-01-19 NOTE — Telephone Encounter (Signed)
Spoke to pt and he has noted progressive worsening of numbness/ tingling in hands, arms, legs. It is transient, shocking type pains. Been going on the the last 2 months.  Naproxen does not help.  Wakes him up at night when sleeping.  Has ocrevus 03-2016.  Has appt Thursday.  Relayed will forward to Faith, dr. Bonnita Hollow nurse.

## 2017-01-19 NOTE — Telephone Encounter (Signed)
Patient called office in reference to Ocrevus.  Patient states he doesn't think its not working in the past month patients legs, hands patient has been having nerve spasms and tingling.  Please call

## 2017-01-22 NOTE — Telephone Encounter (Signed)
LMTC./fim 

## 2017-01-22 NOTE — Telephone Encounter (Signed)
Patient called office returning RN's call.  Please call °

## 2017-01-22 NOTE — Telephone Encounter (Signed)
duplicate/fim  

## 2017-01-22 NOTE — Telephone Encounter (Signed)
I have spoken with Jeremy [name] this morning.  He sts.  he was having a lot of numbness/tingling, electric sensations in arms/hands for several weeks.  He was participating in a new diet--a keto diet, in which carbs and sugar are cut out, he had moderate protein--40-60gms per day, and ate 100-200gms of fat per day.  He stopped this diet 5 days ago and sx. are improving.  He has an appt. with RAS on Thursday and will discuss further at that time/fim

## 2017-01-25 ENCOUNTER — Encounter: Payer: Self-pay | Admitting: Neurology

## 2017-01-25 ENCOUNTER — Ambulatory Visit (INDEPENDENT_AMBULATORY_CARE_PROVIDER_SITE_OTHER): Payer: 59 | Admitting: Neurology

## 2017-01-25 VITALS — BP 120/71 | HR 70 | Ht 66.0 in | Wt 189.0 lb

## 2017-01-25 DIAGNOSIS — Z79899 Other long term (current) drug therapy: Secondary | ICD-10-CM | POA: Diagnosis not present

## 2017-01-25 DIAGNOSIS — R269 Unspecified abnormalities of gait and mobility: Secondary | ICD-10-CM | POA: Diagnosis not present

## 2017-01-25 DIAGNOSIS — R202 Paresthesia of skin: Secondary | ICD-10-CM

## 2017-01-25 DIAGNOSIS — G35 Multiple sclerosis: Secondary | ICD-10-CM

## 2017-01-25 DIAGNOSIS — G5602 Carpal tunnel syndrome, left upper limb: Secondary | ICD-10-CM | POA: Insufficient documentation

## 2017-01-25 DIAGNOSIS — G4726 Circadian rhythm sleep disorder, shift work type: Secondary | ICD-10-CM

## 2017-01-25 MED ORDER — AMPHETAMINE-DEXTROAMPHET ER 30 MG PO CP24: 30.0000 mg | ORAL_CAPSULE | Freq: Every day | ORAL | 0 refills | Status: DC

## 2017-01-25 MED ORDER — ARMODAFINIL 250 MG PO TABS: 250.0000 mg | ORAL_TABLET | Freq: Every day | ORAL | 5 refills | Status: DC

## 2017-01-25 NOTE — Progress Notes (Signed)
[facility]  PATIENT: Jeremy [name] DOB: 03-07-1981  REFERRING CLINICIAN: Burton Apley  HISTORY FROM: patient REASON FOR VISIT: MS, needs to change therapy   HISTORICAL  CHIEF COMPLAINT:  Chief Complaint  Patient presents with  . Multiple Sclerosis    Reports intermittent numbness in his bilateral wrists, hands and lower extremities.  He has also noticed increased fatigue.     HISTORY OF PRESENT ILLNESS:  Jeremy [name] is a 36 year old man with multiple sclerosis.     MS:   Last year, he switched from Gilenya to ocrelizumab. He tolerates it well. He denies any recent exacerbations. On Gilenya, he had liver test elevation.   Numbness:   He notes more numbness in his hands, left worse than right.  He is a Naval architect and drives long distances.  NCV/EMG was normal in the hands 12/30/2014.     He also notes left leg numbness that comes and goes.   He has a h/o sciatica.     Gait/strength/sensation:  He denies major issues with his gait reports mild dificulty with gait with ataxia but no weakness in his legs.  However, he has not fallen.  He sometimes has trouble walking further distances.  He has some numbness in his limbs, worse in left arm.    Bladder:  He has mild urinary frequency and 0-1 nocturia. This is not bad enough to treat.    He was also having some erectile dysfunction.    Viagra helps better than Cialis.  Fatigue/sleep:   He has fatigue most days, physical more than mental. 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    He gets 5-6 hours of sleep or more.   He is on Nuvigil and Adderalll and they have helped the tiredness.   He denies sleepiness while driving or while awake. Marland Kitchen    He takes one xanax at night.     Weight loss:   He lost 12 pounds on the Keto Diet.    Mood:   He has some depression and was placed on Prozac with some benefit. It helps his symptoms partially.      Ulnar nerve:      He had his ulnar nerve transposition recently and everything went well.      MS History:   He was diagnosed in January 2012.   In 2009, he had a several week episode of right hand clumsiness associated with some electric shock sensations. He did not see a doctor about the symptoms. In December 2011, he had the onset of altered taste. A week later, he had nausea and dizziness and over the next couple days these symptoms worsened and he developed right-sided clumsiness and slurred speech. First toward his PCP who felt he might have an ear infection and he was referred to ENT. ENT evaluated him and referred him to Dr. Anne Hahn at [facility] who noted an ataxic gait and ordered an MRI of the spine and brain. Those studies were consistent with multiple sclerosis and he underwent a lumbar puncture in April 2011 that was also consistent with MS. He was started on Rebif in June 2011 and remain on the medicine until September 2014 when a repeat MRI showed the interval development of several more lesions. He was switched to [place] but insurance would no longer cover it so we switched to Gilenya early 2016.     MRIs from 05/25/2014 show  a moderate size focus in the left temporal lobe and another 10 fairly small periventricular and deep white matter foci. MRI of the cervical spine showed 2 foci, one at C4 and one at C6. There were no enhancing lesions on those MRIs.  REVIEW OF SYSTEMS:  Constitutional: No fevers, chills, sweats, or change in appetite.   Has fatigue, shift work issues Eyes: No visual changes, double vision, eye pain Ear, nose and throat: No hearing loss, ear pain, nasal congestion, sore throat Cardiovascular: No chest pain, palpitations Respiratory:  No shortness of breath at rest or with exertion.   No wheezes GastrointestinaI: No nausea, vomiting, diarrhea, abdominal pain, fecal incontinence Genitourinary:  No dysuria, urinary retention.  Mild frequency and nocturia.   Has  ED Musculoskeletal:  No neck pain, back pain Integumentary: No rash, pruritus, skin lesions Neurological: as above Psychiatric: No depression at this time on med;s.  No anxiety Endocrine: No palpitations, diaphoresis, change in appetite, change in weigh or increased thirst Hematologic/Lymphatic:  No anemia, purpura, petechiae. Allergic/Immunologic: No itchy/runny eyes, nasal congestion, recent allergic reactions, rashes  ALLERGIES: Allergies  Allergen Reactions  . Sulfa Antibiotics Nausea Only    HOME MEDICATIONS: Outpatient Medications Prior to Visit  Medication Sig Dispense Refill  . ALPRAZolam (XANAX) 1 MG tablet Take 1 mg by mouth at bedtime as needed for sleep (take 2 tabs as needed at night).    Marland Kitchen HYDROcodone-acetaminophen (NORCO) 7.5-325 MG per tablet as needed.   0  . Multiple Vitamin (MULTIVITAMIN WITH MINERALS) TABS tablet Take 1 tablet by mouth daily.    . naproxen (NAPROSYN) 500 MG tablet Take 1 tablet (500 mg total) by mouth 2 (two) times daily. 180 tablet 3  . ocrelizumab 600 mg in sodium chloride 0.9 % 500 mL Inject 600 mg into the vein every 6 (six) months.    . ondansetron (ZOFRAN) 4 MG tablet Take 1 tablet (4 mg total) by mouth daily as needed for nausea or vomiting. 30 tablet 5  . promethazine (PHENERGAN) 12.5 MG tablet Take 1 tablet (12.5 mg total) by mouth every 6 (six) hours as needed for nausea or vomiting. 30 tablet 1  . sildenafil (VIAGRA) 100 MG tablet Take 1 tablet (100 mg total) by mouth daily as needed for erectile dysfunction. 30 tablet 3  . simvastatin (ZOCOR) 20 MG tablet Take 20 mg by mouth daily.    . tadalafil (CIALIS) 20 MG tablet Take 1 tablet (20 mg total) by mouth daily as needed for erectile dysfunction. 4 tablet 11  . amphetamine-dextroamphetamine (ADDERALL XR) 30 MG 24 hr capsule Take 1 capsule (30 mg total) by mouth daily. 30 capsule 0  . Armodafinil (NUVIGIL) 250 MG tablet Take 1 tablet (250 mg total) by mouth daily. 30 tablet 5  .  levothyroxine (SYNTHROID, LEVOTHROID) 50 MCG tablet Take 50 mcg by mouth daily.  4  . venlafaxine XR (EFFEXOR XR) 75 MG 24 hr capsule Take 1 capsule (75 mg total) by mouth daily with breakfast. 90 capsule 3   Facility-Administered Medications Prior to Visit  Medication Dose Route Frequency Provider Last Rate Last Dose  . gadopentetate dimeglumine (MAGNEVIST) injection 15 mL  15 mL Intravenous Once PRN Elis Sauber A, MD      . gadopentetate dimeglumine (MAGNEVIST) injection 16 mL  16 mL Intravenous Once PRN Jilliam Bellmore, Pearletha Furl, MD        PAST MEDICAL HISTORY: Past Medical History:  Diagnosis Date  . Anxiety   . Back pain, chronic  low  . Blood transfusion without reported diagnosis   . Depression   . Dyslipidemia   . Gait disturbance   . Gynecomastia, male   . Hx of dizziness   . Hypothyroidism   . MS (multiple sclerosis) (HCC)    probable  . PONV (postoperative nausea and vomiting)    Nausea  . Snoring    "mild" sleep apnea. Pt was not required to wear CPAP machine, instead he uses nasal spray  . Testosterone deficiency   . Vision abnormalities     PAST SURGICAL HISTORY: Past Surgical History:  Procedure Laterality Date  . BUNIONECTOMY Right   . GYNECOMASTIA EXCISION     X 2  . GYNECOMASTIA MASTECTOMY Bilateral 03/09/2014   Procedure: BILATERAL EXCISION GYNECOMASTIA;  Surgeon: Robyne Askew, MD;  Location: [facility] OR;  Service: General;  Laterality: Bilateral;  . ULNAR NERVE REPAIR Left     FAMILY HISTORY: Family History  Problem Relation Age of Onset  . Cancer Mother   . Cancer Father   . Hypertension Father   . Stroke Unknown        Grandmother  . Hypertension Brother     SOCIAL HISTORY:  Social History   Social History  . Marital status: Single    Spouse name: N/A  . Number of children: 0  . Years of education: College    Occupational History  . Chef     Social History Main Topics  . Smoking status: Former Games developer  . Smokeless tobacco: Never Used   . Alcohol use 0.0 oz/week     Comment: social  . Drug use: No  . Sexual activity: Not on file   Other Topics Concern  . 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    Patient has a 4 year college education.    Patient has no children.            PHYSICAL EXAM  Vitals:   01/25/17 1507  BP: 120/71  Pulse: 70  Weight: 189 lb (85.7 kg)  Height: 5\' 6"  (1.676 m)    Body mass index is 30.51 kg/m.   General: The patient is well-developed and well-nourished and in no acute distress  Skin: Extremities are without rash or edema..  Neurologic Exam  Mental status: The patient is alert and oriented x 3 at the time of the examination. The patient has apparent normal recent and remote memory, with an apparently normal attention span and concentration ability.   Speech is normal.  Cranial nerves: Extraocular movements are full.  Facial strength and sensation is normal. Trapezius strength is normal.. No dysarthria is noted.   No obvious hearing deficits are noted.  Motor:  Muscle bulk and tone are normal. Strength is  5 / 5 in all 4 extremities.   Sensory: Sensory testing is intact to touch and vibration in the arms and legs.  Coordination: Cerebellar testing reveals mildly reduced right finger-nose-finger and heel-to-shin.  Gait and station: Station and gait are normal. Tandem gait is slightly wide. Romberg is negative.   Reflexes: Deep tendon reflexes are increased in legs with spread at both knees.        DIAGNOSTIC DATA (LABS, IMAGING, TESTING) - I reviewed patient records, labs, notes, testing and imaging myself where available.  Lab Results  Component Value Date   WBC 4.1 03/13/2016   HGB 15.5 03/13/2016   HCT 46.7 03/13/2016   MCV 92 03/13/2016  PLT 169 03/13/2016      Component Value Date/Time   NA 138 03/13/2016 1129   K 4.5 03/13/2016 1129   CL 97 03/13/2016 1129   CO2 23 03/13/2016 1129   GLUCOSE 89 03/13/2016 1129    GLUCOSE 82 03/02/2014 1342   BUN 14 03/13/2016 1129   CREATININE 0.77 03/13/2016 1129   CALCIUM 10.0 03/13/2016 1129   PROT 7.2 03/13/2016 1129   ALBUMIN 4.6 03/13/2016 1129   AST 38 03/13/2016 1129   ALT 101 (H) 03/13/2016 1129   ALKPHOS 98 03/13/2016 1129   BILITOT 0.6 03/13/2016 1129   GFRNONAA 117 03/13/2016 1129   GFRAA 135 03/13/2016 1129      ASSESSMENT AND PLAN  Multiple sclerosis (HCC) - Plan: MR CERVICAL SPINE WO CONTRAST, CBC with Differential/Platelet, Hepatic function panel, CANCELED: MR CERVICAL SPINE WO CONTRAST  Abnormality of gait  High risk medication use - Plan: CBC with Differential/Platelet, Hepatic function panel  Paresthesia - Plan: MR CERVICAL SPINE WO CONTRAST  Shift work sleep disorder - Plan: CANCELED: MR CERVICAL SPINE WO CONTRAST  Carpal tunnel syndrome of left wrist  1.   Continue ocrelizumab.   Check labs.   2.   The etiology of his left greater than right hand numbness is uncertain. There appears to be a positional element to it and carpal tunnel syndrome or cervical radiculopathy is most likely, due to his MS I can't rule out demyelination. EMG 2 years ago did not show any evidence of carpal tunnel syndrome. We will check an MRI of the cervical spine to determine if he has a herniated disc or other finding that might respond better to a different treatment.   If due to demyelination, he is already on efficacious disease modifying therapy. He will get a wrist splint to see if that helps..   3.   Renew Adderall and Nuvigil for fatigue and shift work disorder     4.   He will continue to be active and exercises as tolerated. 5.   Mr. 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  He has fatigue that affects walking endurance.,    Return in 6 months or sooner if there are new or worsening neurologic symptoms.    Yamilee Harmes A. Epimenio Foot, MD, PhD 01/25/2017, 6:39  PM Certified in Neurology, Clinical Neurophysiology, Sleep Medicine, Pain Medicine and Neuroimaging  [facility] [address], [address] [place], [place] [address] [phone]

## 2017-01-26 ENCOUNTER — Other Ambulatory Visit (INDEPENDENT_AMBULATORY_CARE_PROVIDER_SITE_OTHER): Payer: Self-pay

## 2017-01-26 ENCOUNTER — Other Ambulatory Visit: Payer: Self-pay | Admitting: Neurology

## 2017-01-26 DIAGNOSIS — G35 Multiple sclerosis: Secondary | ICD-10-CM

## 2017-01-26 DIAGNOSIS — Z0289 Encounter for other administrative examinations: Secondary | ICD-10-CM

## 2017-01-27 LAB — CBC WITH DIFFERENTIAL/PLATELET
BASOS ABS: 0 10*3/uL (ref 0.0–0.2)
Basos: 0 %
EOS (ABSOLUTE): 0.1 10*3/uL (ref 0.0–0.4)
Eos: 2 %
HEMOGLOBIN: 14.9 g/dL (ref 13.0–17.7)
Hematocrit: 45.3 % (ref 37.5–51.0)
IMMATURE GRANS (ABS): 0 10*3/uL (ref 0.0–0.1)
IMMATURE GRANULOCYTES: 1 %
LYMPHS ABS: 1 10*3/uL (ref 0.7–3.1)
LYMPHS: 19 %
MCH: 29.8 pg (ref 26.6–33.0)
MCHC: 32.9 g/dL (ref 31.5–35.7)
MCV: 91 fL (ref 79–97)
MONOCYTES: 7 %
Monocytes Absolute: 0.4 10*3/uL (ref 0.1–0.9)
NEUTROS PCT: 71 %
Neutrophils Absolute: 3.8 10*3/uL (ref 1.4–7.0)
Platelets: 186 10*3/uL (ref 150–379)
RBC: 5 x10E6/uL (ref 4.14–5.80)
RDW: 13.4 % (ref 12.3–15.4)
WBC: 5.4 10*3/uL (ref 3.4–10.8)

## 2017-01-27 LAB — HEPATIC FUNCTION PANEL
ALBUMIN: 4.4 g/dL (ref 3.5–5.5)
ALK PHOS: 105 IU/L (ref 39–117)
ALT: 43 IU/L (ref 0–44)
AST: 20 IU/L (ref 0–40)
Bilirubin Total: 0.7 mg/dL (ref 0.0–1.2)
Bilirubin, Direct: 0.2 mg/dL (ref 0.00–0.40)
TOTAL PROTEIN: 6.4 g/dL (ref 6.0–8.5)

## 2017-01-29 ENCOUNTER — Telehealth: Payer: Self-pay | Admitting: *Deleted

## 2017-01-29 NOTE — Telephone Encounter (Signed)
I attempted to contact pt. Received message that "the wireless customer you are trying to call is not available at this time--please try your call again later.  No opportunity to leave a message/fim

## 2017-01-29 NOTE — Telephone Encounter (Signed)
-----   Message from Richard A Sater, MD sent at 01/28/2017  6:13 PM EDT ----- Please let the patient know that the lab work is fine.  

## 2017-01-31 ENCOUNTER — Other Ambulatory Visit: Payer: Self-pay | Admitting: Neurology

## 2017-01-31 ENCOUNTER — Other Ambulatory Visit: Payer: 59

## 2017-02-01 ENCOUNTER — Telehealth: Payer: Self-pay | Admitting: Neurology

## 2017-02-01 NOTE — Telephone Encounter (Signed)
I got a call from the pharmacist. The patient tried to get a prescription filled for the 30 mg extended-release Adderall today, he just got a 20 mg short acting prescription filled on 01/25/2017.  The patient got the prior for prescription from Dr. Su Hilt.  I have asked him to hold the 30 mg extended-release prescription.

## 2017-02-01 NOTE — Telephone Encounter (Signed)
Please let her know that we can't write anymore Adderall. He is getting his prescriptions from Dr. Su Hilt

## 2017-02-01 NOTE — Telephone Encounter (Signed)
Pt was informed by  [id number] - [place], [place] - [address] [phone] (Phone) [phone] (Fax)   That for the Armodafinil (NUVIGIL) 250 MG tablet needs a prior authorization. Pt also informed that with his new insurance will not cover extended release Adderall only regular, please call

## 2017-02-02 ENCOUNTER — Telehealth: Payer: Self-pay | Admitting: *Deleted

## 2017-02-02 NOTE — Telephone Encounter (Signed)
I have spoken with Jeremy [name] this morning and per RAS, advised that lab work done in our office is fine.  He verbalized understanding of same/fim

## 2017-02-02 NOTE — Telephone Encounter (Signed)
-----   Message from Asa Lente, MD sent at 01/28/2017  6:13 PM EDT ----- Please let the patient know that the lab work is fine.

## 2017-02-02 NOTE — Telephone Encounter (Signed)
-----   Message from Richard A Sater, MD sent at 01/28/2017  6:13 PM EDT ----- Please let the patient know that the lab work is fine.  

## 2017-02-02 NOTE — Telephone Encounter (Signed)
LMTC./fim 

## 2017-02-02 NOTE — Telephone Encounter (Signed)
duplicate/fim  

## 2017-02-02 NOTE — Telephone Encounter (Signed)
Patient called office returning RN's call.  Please call °

## 2017-02-02 NOTE — Telephone Encounter (Signed)
I have spoken with Margie this morning and per RAS, advised that, as he was receiving Adderall rx's from Dr. Su Hilt as well, Dr. Epimenio Foot will not be able to rx. this medication for him.  He verbalized understanding of same/fim

## 2017-02-05 NOTE — Telephone Encounter (Signed)
Armodafinil PA completed by phone with [facility], phone# [phone].  For dx. of shift work sleep disorder (G47.26).  PA approved thru 02/05/18.  Ref# [id number].  Pt. aware/fim

## 2017-02-07 ENCOUNTER — Ambulatory Visit (INDEPENDENT_AMBULATORY_CARE_PROVIDER_SITE_OTHER): Payer: 59

## 2017-02-07 DIAGNOSIS — R202 Paresthesia of skin: Secondary | ICD-10-CM | POA: Diagnosis not present

## 2017-02-07 DIAGNOSIS — G35 Multiple sclerosis: Secondary | ICD-10-CM

## 2017-02-08 ENCOUNTER — Ambulatory Visit: Payer: BLUE CROSS/BLUE SHIELD | Admitting: Neurology

## 2017-02-09 ENCOUNTER — Telehealth: Payer: Self-pay | Admitting: *Deleted

## 2017-02-09 NOTE — Telephone Encounter (Signed)
-----   Message from Asa Lente, MD sent at 02/09/2017 10:23 AM EDT ----- Please let him know that the MRI of the cervical spine did not show any new lesions.

## 2017-02-09 NOTE — Telephone Encounter (Signed)
LMOM that  per RAS, MRI cervical spine showed no new lesions.  He does not need to return this call unless he has questions/fim

## 2017-05-17 DIAGNOSIS — G35 Multiple sclerosis: Secondary | ICD-10-CM | POA: Diagnosis not present

## 2017-06-01 DIAGNOSIS — Z23 Encounter for immunization: Secondary | ICD-10-CM | POA: Diagnosis not present

## 2017-06-16 ENCOUNTER — Other Ambulatory Visit: Payer: Self-pay | Admitting: Neurology

## 2017-08-09 ENCOUNTER — Ambulatory Visit: Payer: 59 | Admitting: Neurology

## 2017-08-31 DIAGNOSIS — M545 Low back pain: Secondary | ICD-10-CM | POA: Diagnosis not present

## 2017-08-31 DIAGNOSIS — G4726 Circadian rhythm sleep disorder, shift work type: Secondary | ICD-10-CM | POA: Diagnosis not present

## 2017-08-31 DIAGNOSIS — G8929 Other chronic pain: Secondary | ICD-10-CM | POA: Diagnosis not present

## 2017-10-11 ENCOUNTER — Ambulatory Visit: Payer: 59 | Admitting: Neurology

## 2017-10-11 ENCOUNTER — Other Ambulatory Visit: Payer: Self-pay

## 2017-10-11 ENCOUNTER — Encounter: Payer: Self-pay | Admitting: Neurology

## 2017-10-11 VITALS — BP 110/60 | HR 68 | Resp 16 | Ht 66.0 in | Wt 183.5 lb

## 2017-10-11 DIAGNOSIS — G35 Multiple sclerosis: Secondary | ICD-10-CM

## 2017-10-11 DIAGNOSIS — R269 Unspecified abnormalities of gait and mobility: Secondary | ICD-10-CM | POA: Diagnosis not present

## 2017-10-11 DIAGNOSIS — R5383 Other fatigue: Secondary | ICD-10-CM

## 2017-10-11 DIAGNOSIS — Z79899 Other long term (current) drug therapy: Secondary | ICD-10-CM

## 2017-10-11 DIAGNOSIS — G4726 Circadian rhythm sleep disorder, shift work type: Secondary | ICD-10-CM

## 2017-10-11 MED ORDER — ARMODAFINIL 250 MG PO TABS: 250.0000 mg | ORAL_TABLET | Freq: Every day | ORAL | 1 refills | Status: DC

## 2017-10-11 MED ORDER — ONDANSETRON HCL 8 MG PO TABS: 4.0000 mg | ORAL_TABLET | Freq: Every day | ORAL | 5 refills | Status: DC | PRN

## 2017-10-11 MED ORDER — SILDENAFIL CITRATE 100 MG PO TABS: 100.0000 mg | ORAL_TABLET | Freq: Every day | ORAL | 3 refills | Status: DC | PRN

## 2017-10-11 NOTE — Progress Notes (Signed)
[facility]  PATIENT: Jeremy [name] DOB: 26-Jan-1981  REFERRING CLINICIAN: Burton Apley  HISTORY FROM: patient REASON FOR VISIT: MS, needs to change therapy   HISTORICAL  CHIEF COMPLAINT:  Chief Complaint  Patient presents with  . Multiple Sclerosis    Next Ocrevus (full dose) is scheduled in April.  Ins. covers Nuvigil, but due to his copay, he would like to get a 75mo. supply if appropriate.  Also needs r/s of Zofran and Viagra/fim    HISTORY OF PRESENT ILLNESS:  Jeremy [name] is a 37 year old man with multiple sclerosis.     Update 10/11/2017: He is doing well with no new exacerbations.  He started Ocrevus in 2018 and will do his next dose 10/2017.     The last brain MRI in 2017 did not show any new lesions.  The MRI of the cervical spine did not show any new lesions.  He does have 2 old ones at C4-C5 and C6.  He denies any issues with gait or strength.   Occasionally, balance is slightly off but he never falls.   He rarely has some wrist numbness (CTS?).   He has mild urinary frequency.   Vision is good  Fatigue is doing well.   Nuvigil has helped his fatigue and he tolerates it well.    He takes xanax at night to help him sleep.   Sometimes he wakes up and has trouble falling back asleep.   Mood is doing well.   Cognition is normal.    From 01/25/2017: MS:   Last year, he switched from Gilenya to ocrelizumab. He tolerates it well. He denies any recent exacerbations. On Gilenya, he had liver test elevation.   Numbness:   He notes more numbness in his hands, left worse than right.  He is a Naval architect and drives long distances.  NCV/EMG was normal in the hands 12/30/2014.     He also notes left leg numbness that comes and goes.   He has a h/o sciatica.     Gait/strength/sensation:  He denies major issues with his gait reports mild dificulty with gait with ataxia but no weakness in his legs.  However, he has not fallen.  He sometimes has trouble walking further  distances.  He has some numbness in his limbs, worse in left arm.    Bladder:  He has mild urinary frequency and 0-1 nocturia. This is not bad enough to treat.    He was also having some erectile dysfunction.    Viagra helps better than Cialis.  Fatigue/sleep:   He has fatigue most days, physical more than mental. 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    He gets 5-6 hours of sleep or more.   He is on Nuvigil and Adderalll and they have helped the tiredness.   He denies sleepiness while driving or while awake. Marland Kitchen    He takes one xanax at night.     Weight loss:   He lost 12 pounds on the Keto Diet.    Mood:   He has some depression and was placed on Prozac with some benefit. It helps his symptoms partially.      Ulnar nerve:     He had his ulnar nerve transposition recently and everything went well.      MS History:   He was diagnosed in January 2012.   In 2009, he had a several  week episode of right hand clumsiness associated with some electric shock sensations. He did not see a doctor about the symptoms. In December 2011, he had the onset of altered taste. A week later, he had nausea and dizziness and over the next couple days these symptoms worsened and he developed right-sided clumsiness and slurred speech. First toward his PCP who felt he might have an ear infection and he was referred to ENT. ENT evaluated him and referred him to Dr. Anne Hahn at [facility] who noted an ataxic gait and ordered an MRI of the spine and brain. Those studies were consistent with multiple sclerosis and he underwent a lumbar puncture in April 2011 that was also consistent with MS. He was started on Rebif in June 2011 and remain on the medicine until September 2014 when a repeat MRI showed the interval development of several more lesions. He was switched to [place] but insurance would no longer cover it so we switched to Gilenya early 2016.     MRIs  from 05/25/2014 show a moderate size focus in the left temporal lobe and another 10 fairly small periventricular and deep white matter foci. MRI of the cervical spine showed 2 foci, one at C4 and one at C6. There were no enhancing lesions on those MRIs.  REVIEW OF SYSTEMS:  Constitutional: No fevers, chills, sweats, or change in appetite.   Has fatigue, shift work issues Eyes: No visual changes, double vision, eye pain Ear, nose and throat: No hearing loss, ear pain, nasal congestion, sore throat Cardiovascular: No chest pain, palpitations Respiratory:  No shortness of breath at rest or with exertion.   No wheezes GastrointestinaI: No nausea, vomiting, diarrhea, abdominal pain, fecal incontinence Genitourinary:  No dysuria, urinary retention.  Mild frequency and nocturia.   Has ED Musculoskeletal:  No neck pain, back pain Integumentary: No rash, pruritus, skin lesions Neurological: as above Psychiatric: No depression at this time on med;s.  No anxiety Endocrine: No palpitations, diaphoresis, change in appetite, change in weigh or increased thirst Hematologic/Lymphatic:  No anemia, purpura, petechiae. Allergic/Immunologic: No itchy/runny eyes, nasal congestion, recent allergic reactions, rashes  ALLERGIES: Allergies  Allergen Reactions  . Sulfa Antibiotics Nausea Only    HOME MEDICATIONS: Outpatient Medications Prior to Visit  Medication Sig Dispense Refill  . ALPRAZolam (XANAX) 1 MG tablet Take 1 mg by mouth at bedtime as needed for sleep (take 2 tabs as needed at night).    Marland Kitchen HYDROcodone-acetaminophen (NORCO) 7.5-325 MG per tablet as needed.   0  . Multiple Vitamin (MULTIVITAMIN WITH MINERALS) TABS tablet Take 1 tablet by mouth daily.    . naproxen (NAPROSYN) 500 MG tablet Take 1 tablet (500 mg total) by mouth 2 (two) times daily. 180 tablet 3  . ocrelizumab 600 mg in sodium chloride 0.9 % 500 mL Inject 600 mg into the vein every 6 (six) months.    . promethazine (PHENERGAN) 12.5 MG  tablet TAKE 1 TABLET (12.5 MG TOTAL) BY MOUTH EVERY 6 (SIX) HOURS AS NEEDED FOR NAUSEA OR VOMITING. 30 tablet 1  . simvastatin (ZOCOR) 20 MG tablet Take 20 mg by mouth daily.    . tadalafil (CIALIS) 20 MG tablet Take 1 tablet (20 mg total) by mouth daily as needed for erectile dysfunction. 4 tablet 11  . Armodafinil (NUVIGIL) 250 MG tablet Take 1 tablet (250 mg total) by mouth daily. 30 tablet 5  . ondansetron (ZOFRAN) 4 MG tablet Take 1 tablet (4 mg total) by mouth daily as needed for nausea  or vomiting. 30 tablet 5  . sildenafil (VIAGRA) 100 MG tablet Take 1 tablet (100 mg total) by mouth daily as needed for erectile dysfunction. 30 tablet 3  . amphetamine-dextroamphetamine (ADDERALL) 20 MG tablet TK 1 T PO  QAM TID  0  . amphetamine-dextroamphetamine (ADDERALL XR) 30 MG 24 hr capsule Take 1 capsule (30 mg total) by mouth daily. (Patient not taking: Reported on 10/11/2017) 30 capsule 0   Facility-Administered Medications Prior to Visit  Medication Dose Route Frequency Provider Last Rate Last Dose  . gadopentetate dimeglumine (MAGNEVIST) injection 15 mL  15 mL Intravenous Once PRN Sater, Richard A, MD      . gadopentetate dimeglumine (MAGNEVIST) injection 16 mL  16 mL Intravenous Once PRN Sater, Pearletha Furl, MD        PAST MEDICAL HISTORY: Past Medical History:  Diagnosis Date  . Anxiety   . Back pain, chronic    low  . Blood transfusion without reported diagnosis   . Depression   . Dyslipidemia   . Gait disturbance   . Gynecomastia, male   . Hx of dizziness   . Hypothyroidism   . MS (multiple sclerosis) (HCC)    probable  . PONV (postoperative nausea and vomiting)    Nausea  . Snoring    "mild" sleep apnea. Pt was not required to wear CPAP machine, instead he uses nasal spray  . Testosterone deficiency   . Vision abnormalities     PAST SURGICAL HISTORY: Past Surgical History:  Procedure Laterality Date  . BUNIONECTOMY Right   . GYNECOMASTIA EXCISION     X 2  . GYNECOMASTIA  MASTECTOMY Bilateral 03/09/2014   Procedure: BILATERAL EXCISION GYNECOMASTIA;  Surgeon: Robyne Askew, MD;  Location: [facility] OR;  Service: General;  Laterality: Bilateral;  . ULNAR NERVE REPAIR Left     FAMILY HISTORY: Family History  Problem Relation Age of Onset  . Cancer Mother   . Cancer Father   . Hypertension Father   . Stroke Unknown        Grandmother  . Hypertension Brother     SOCIAL HISTORY:  Social History   Socioeconomic History  . Marital status: Single    Spouse name: Not on file  . Number of children: 0  . Years of education: College   . Highest education level: Not on file  Occupational History  . Occupation: Chef   Social Needs  . Financial resource strain: Not on file  . Food insecurity:    Worry: Not on file    Inability: Not on file  . Transportation needs:    Medical: Not on file    Non-medical: Not on file  Tobacco Use  . Smoking status: Former Games developer  . Smokeless tobacco: Never Used  Substance and Sexual Activity  . Alcohol use: Yes    Alcohol/week: 0.0 oz    Comment: social  . Drug use: No  . Sexual activity: Not on file  Lifestyle  . Physical activity:    Days per week: Not on file    Minutes per session: Not on file  . Stress: Not on file  Relationships  . Social connections:    Talks on phone: Not on file    Gets together: Not on file    Attends religious service: Not on file    Active member of club or organization: Not on file    Attends meetings of clubs or organizations: Not on file    Relationship status: Not on  file  . Intimate partner violence:    Fear of current or ex partner: Not on file    Emotionally abused: Not on file    Physically abused: Not on file    Forced sexual activity: Not on file  Other Topics Concern  . 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    Patient has a 4 year college education.    Patient has no children.            PHYSICAL EXAM  Vitals:    10/11/17 1253  BP: 110/60  Pulse: 68  Resp: 16  Weight: 183 lb 8 oz (83.2 kg)  Height: 5\' 6"  (1.676 m)    Body mass index is 29.62 kg/m.   General: The patient is well-developed and well-nourished and in no acute distress   Neurologic Exam  Mental status: The patient is alert and oriented x 3 at the time of the examination. The patient has apparent normal recent and remote memory, with an apparently normal attention span and concentration ability.   Speech is normal.  Cranial nerves: Extraocular movements are full.  Facial strength and sensation is normal.  Trapezius strength is normal.  No obvious hearing deficits are noted.  Motor:  Muscle bulk and tone are normal. Strength is  5 / 5 in all 4 extremities.   Sensory: Sensory testing is intact to touch and vibration in the arms and legs.  Coordination: Cerebellar testing reveals mildly reduced right finger-nose-finger and heel-to-shin.  Gait and station: Station and gait are normal.  Tandem gait is mildly wide.  Romberg is negative.  Ive.   Reflexes: Deep tendon reflexes are increased at the knees.  There is no ankle clonus.  Marland Kitchen        DIAGNOSTIC DATA (LABS, IMAGING, TESTING) - I reviewed patient records, labs, notes, testing and imaging myself where available.  Lab Results  Component Value Date   WBC 5.4 01/26/2017   HGB 14.9 01/26/2017   HCT 45.3 01/26/2017   MCV 91 01/26/2017   PLT 186 01/26/2017      Component Value Date/Time   NA 138 03/13/2016 1129   K 4.5 03/13/2016 1129   CL 97 03/13/2016 1129   CO2 23 03/13/2016 1129   GLUCOSE 89 03/13/2016 1129   GLUCOSE 82 03/02/2014 1342   BUN 14 03/13/2016 1129   CREATININE 0.77 03/13/2016 1129   CALCIUM 10.0 03/13/2016 1129   PROT 6.4 01/26/2017 1220   ALBUMIN 4.4 01/26/2017 1220   AST 20 01/26/2017 1220   ALT 43 01/26/2017 1220   ALKPHOS 105 01/26/2017 1220   BILITOT 0.7 01/26/2017 1220   GFRNONAA 117 03/13/2016 1129   GFRAA 135 03/13/2016 1129       ASSESSMENT AND PLAN  Multiple sclerosis (HCC)  Abnormality of gait  Shift work sleep disorder  Other fatigue  High risk medication use  1.   Continue ocrelizumab.   Check labs, CBC and IgG/IgM/IgA.  The palm of his next visit, we will check another MRI of the brain to determine if there is any subclinical progression. 2.   Nuvigil for fatigue and shift work disorder     3.   He will continue to be active and exercises as tolerated. 4.   Mr. 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  He has fatigue that affects walking endurance.,  Return in 6 months or sooner if there are new or worsening neurologic symptoms.    Richard A. Epimenio Foot, MD, PhD 10/11/2017, 1:22 PM Certified in Neurology, Clinical Neurophysiology, Sleep Medicine, Pain Medicine and Neuroimaging  [facility] [address] [place], [place] [address] [phone] 0uh

## 2017-10-12 ENCOUNTER — Telehealth: Payer: Self-pay | Admitting: *Deleted

## 2017-10-12 LAB — CBC WITH DIFFERENTIAL/PLATELET
Basophils Absolute: 0 10*3/uL (ref 0.0–0.2)
Basos: 0 %
EOS (ABSOLUTE): 0.1 10*3/uL (ref 0.0–0.4)
EOS: 2 %
HEMATOCRIT: 44.3 % (ref 37.5–51.0)
Hemoglobin: 14.8 g/dL (ref 13.0–17.7)
Immature Grans (Abs): 0 10*3/uL (ref 0.0–0.1)
Immature Granulocytes: 1 %
LYMPHS ABS: 1.3 10*3/uL (ref 0.7–3.1)
Lymphs: 24 %
MCH: 30.6 pg (ref 26.6–33.0)
MCHC: 33.4 g/dL (ref 31.5–35.7)
MCV: 92 fL (ref 79–97)
Monocytes Absolute: 0.3 10*3/uL (ref 0.1–0.9)
Monocytes: 6 %
NEUTROS ABS: 3.5 10*3/uL (ref 1.4–7.0)
Neutrophils: 67 %
Platelets: 180 10*3/uL (ref 150–379)
RBC: 4.84 x10E6/uL (ref 4.14–5.80)
RDW: 13.4 % (ref 12.3–15.4)
WBC: 5.2 10*3/uL (ref 3.4–10.8)

## 2017-10-12 LAB — IGG, IGA, IGM
IGA/IMMUNOGLOBULIN A, SERUM: 144 mg/dL (ref 90–386)
IgG (Immunoglobin G), Serum: 913 mg/dL (ref 700–1600)
IgM (Immunoglobulin M), Srm: 38 mg/dL (ref 20–172)

## 2017-10-12 NOTE — Telephone Encounter (Signed)
Spoke to patient - he is aware of lab results. 

## 2017-10-12 NOTE — Telephone Encounter (Signed)
-----   Message from Richard A Sater, MD sent at 10/12/2017 10:30 AM EDT ----- Please let the patient know that the lab work is fine.  

## 2017-11-08 DIAGNOSIS — L74510 Primary focal hyperhidrosis, axilla: Secondary | ICD-10-CM | POA: Diagnosis not present

## 2017-11-08 DIAGNOSIS — Z1283 Encounter for screening for malignant neoplasm of skin: Secondary | ICD-10-CM | POA: Diagnosis not present

## 2017-11-16 DIAGNOSIS — G35 Multiple sclerosis: Secondary | ICD-10-CM | POA: Diagnosis not present

## 2017-11-30 DIAGNOSIS — Z0000 Encounter for general adult medical examination without abnormal findings: Secondary | ICD-10-CM | POA: Diagnosis not present

## 2017-11-30 DIAGNOSIS — Z13220 Encounter for screening for lipoid disorders: Secondary | ICD-10-CM | POA: Diagnosis not present

## 2017-11-30 DIAGNOSIS — Z87898 Personal history of other specified conditions: Secondary | ICD-10-CM | POA: Diagnosis not present

## 2017-11-30 DIAGNOSIS — N62 Hypertrophy of breast: Secondary | ICD-10-CM | POA: Diagnosis not present

## 2017-11-30 DIAGNOSIS — N390 Urinary tract infection, site not specified: Secondary | ICD-10-CM | POA: Diagnosis not present

## 2017-12-06 DIAGNOSIS — Z0000 Encounter for general adult medical examination without abnormal findings: Secondary | ICD-10-CM | POA: Diagnosis not present

## 2018-02-11 DIAGNOSIS — E039 Hypothyroidism, unspecified: Secondary | ICD-10-CM | POA: Diagnosis not present

## 2018-03-15 DIAGNOSIS — M5441 Lumbago with sciatica, right side: Secondary | ICD-10-CM | POA: Diagnosis not present

## 2018-03-15 DIAGNOSIS — M5442 Lumbago with sciatica, left side: Secondary | ICD-10-CM | POA: Diagnosis not present

## 2018-04-19 ENCOUNTER — Ambulatory Visit: Payer: 59 | Admitting: Neurology

## 2018-05-29 ENCOUNTER — Telehealth: Payer: Self-pay | Admitting: Neurology

## 2018-05-29 NOTE — Telephone Encounter (Signed)
Called pt. Offered appt. He accepted date/time. I scheduled him.

## 2018-05-29 NOTE — Telephone Encounter (Signed)
Per Dr. Epimenio Foot- ok to work pt in for appt on 06/10/18 at 2pm

## 2018-05-29 NOTE — Telephone Encounter (Signed)
I called patient back. 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 He needs to be seen at least a week prior to this date.   They are asking things like: verifying he has had no signs of relapse/progression of MS, not taking nuvigil (pt verified he is not), tolerating his medications.  Advised he would most likely need to be seen and evaluated since his last OV was 10/11/17. Dr. Epimenio Foot is fully booked currently but I will speak with him about working him in sooner. I will call him back. He verbalized understanding.

## 2018-05-29 NOTE — Telephone Encounter (Signed)
Patient has appointment with Dr. Epimenio Foot on 09-05-18 but says he needs to be seen sooner because he needs to discuss a letter his employer needs.. He says Dr. Epimenio Foot has written a  letter before.  Please call and discuss.

## 2018-05-31 NOTE — Telephone Encounter (Signed)
I called patient back. 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  I again expressed since he was last seen 09/2017 he would need to f/u with Dr. Epimenio Foot for him to re-evaluate him prior to being able to write a letter for him. He verbalized understanding and appreciation. Advised I will call if any sooner appt become available.

## 2018-05-31 NOTE — Telephone Encounter (Signed)
Pt has called asking RN Kara Mead calls him re: needed letter for work

## 2018-06-10 ENCOUNTER — Encounter: Payer: Self-pay | Admitting: Neurology

## 2018-06-10 ENCOUNTER — Other Ambulatory Visit: Payer: Self-pay | Admitting: Neurology

## 2018-06-10 ENCOUNTER — Ambulatory Visit: Payer: 59 | Admitting: Neurology

## 2018-06-10 ENCOUNTER — Other Ambulatory Visit: Payer: Self-pay

## 2018-06-10 VITALS — BP 110/70 | HR 86 | Ht 66.0 in | Wt 190.0 lb

## 2018-06-10 DIAGNOSIS — G4700 Insomnia, unspecified: Secondary | ICD-10-CM | POA: Diagnosis not present

## 2018-06-10 DIAGNOSIS — Z79899 Other long term (current) drug therapy: Secondary | ICD-10-CM

## 2018-06-10 DIAGNOSIS — G35 Multiple sclerosis: Secondary | ICD-10-CM

## 2018-06-10 NOTE — Progress Notes (Signed)
[facility]  PATIENT: Jeremy [name] DOB: 12/08/1980  REFERRING CLINICIAN: Burton Apley  HISTORY FROM: patient REASON FOR VISIT: MS, needs to change therapy   HISTORICAL  CHIEF COMPLAINT:  Chief Complaint  Patient presents with  . Follow-up    RM 13, alone. Last seen 10/11/17. He failed DOT physical. Here for f/u. Needs letter for work to clear him.  . Multiple Sclerosis    On Ocrevus. Last infusion: a couple weeks ago. Denies any new sx, Denies any vision changes, trouble walking, or new numbness/tingling.     HISTORY OF PRESENT ILLNESS:  Jeremy [name] is a 37 y.o. man with multiple sclerosis.    Update 06/10/2018: He is on Ocrevus for multiple sclerosis.  He tolerates it well.  His last infusion was a couple weeks ago.  He feels his MS has been stable and he does not have any exacerbations.  He denies any difficulty with gait. No stumbles or falls.   He denies weakness or numbness in the arms or legs.   Coordination is fine.    Bladder function is okay with mild frequency at times.  He denies any difficulties with his vision.   No color asymmetry.   He is not noting much trouble with fatigue now and stopped Nuvigil.  He tolerates it well.  He has some insomnia and will take Xanax to help some nights.  He denies any difficulties with depression or anxiety.  Cognition is normal.  Update 10/11/2017: He is doing well with no new exacerbations.  He started Ocrevus in 2018 and will do his next dose 10/2017.     The last brain MRI in 2017 did not show any new lesions.  The MRI of the cervical spine did not show any new lesions.  He does have 2 old ones at C4-C5 and C6.  He denies any issues with gait or strength.   Occasionally, balance is slightly off but he never falls.   He rarely has some wrist numbness (CTS?).   He has mild urinary frequency.   Vision is good  Fatigue is doing well.   Nuvigil has helped his fatigue and he tolerates it well.    He takes xanax at  night to help him sleep.   Sometimes he wakes up and has trouble falling back asleep.   Mood is doing well.   Cognition is normal.    From 01/25/2017: MS:   Last year, he switched from Gilenya to ocrelizumab. He tolerates it well. He denies any recent exacerbations. On Gilenya, he had liver test elevation.   Numbness:   He notes more numbness in his hands, left worse than right.  He is a Naval architect and drives long distances.  NCV/EMG was normal in the hands 12/30/2014.     He also notes left leg numbness that comes and goes.   He has a h/o sciatica.     Gait/strength/sensation:  He denies major issues with his gait reports mild dificulty with gait with ataxia but no weakness in his legs.  However, he has not fallen.  He sometimes has trouble walking further distances.  He has some numbness in his limbs, worse in left arm.    Bladder:  He has mild urinary frequency and 0-1 nocturia. This is not bad enough to treat.    He was also having some erectile dysfunction.    Viagra helps better than Cialis.  Fatigue/sleep:   He has fatigue most days, physical more than mental.  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    He gets 5-6 hours of sleep or more.   He is on Nuvigil and Adderalll and they have helped the tiredness.   He denies sleepiness while driving or while awake. Marland Kitchen    He takes one xanax at night.     Weight loss:   He lost 12 pounds on the Keto Diet.    Mood:   He has some depression and was placed on Prozac with some benefit. It helps his symptoms partially.      Ulnar nerve:     He had his ulnar nerve transposition recently and everything went well.      MS History:   He was diagnosed in January 2012.   In 2009, he had a several week episode of right hand clumsiness associated with some electric shock sensations. He did not see a doctor about the symptoms. In December 2011, he had the onset of altered taste. A week later, he had  nausea and dizziness and over the next couple days these symptoms worsened and he developed right-sided clumsiness and slurred speech. First toward his PCP who felt he might have an ear infection and he was referred to ENT. ENT evaluated him and referred him to Dr. Anne Hahn at [facility] who noted an ataxic gait and ordered an MRI of the spine and brain. Those studies were consistent with multiple sclerosis and he underwent a lumbar puncture in April 2011 that was also consistent with MS. He was started on Rebif in June 2011 and remain on the medicine until September 2014 when a repeat MRI showed the interval development of several more lesions. He was switched to [place] but insurance would no longer cover it so we switched to Gilenya early 2016.     MRIs from 05/25/2014 show a moderate size focus in the left temporal lobe and another 10 fairly small periventricular and deep white matter foci. MRI of the cervical spine showed 2 foci, one at C4 and one at C6. There were no enhancing lesions on those MRIs.  REVIEW OF SYSTEMS:  Constitutional: No fevers, chills, sweats, or change in appetite.   Occasional insomnia Eyes: No visual changes, double vision, eye pain Ear, nose and throat: No hearing loss, ear pain, nasal congestion, sore throat Cardiovascular: No chest pain, palpitations Respiratory:  No shortness of breath at rest or with exertion.   No wheezes GastrointestinaI: No nausea, vomiting, diarrhea, abdominal pain, fecal incontinence Genitourinary:  No dysuria, urinary retention.  Mild frequency and nocturia.   Has ED Musculoskeletal:  No neck pain, back pain Integumentary: No rash, pruritus, skin lesions Neurological: as above Psychiatric: No depression or anxiety Endocrine: No palpitations, diaphoresis, change in appetite, change in weigh or increased thirst Hematologic/Lymphatic:  No anemia, purpura, petechiae. Allergic/Immunologic: No itchy/runny eyes, nasal congestion, recent  allergic reactions, rashes  ALLERGIES: Allergies  Allergen Reactions  . Sulfa Antibiotics Nausea Only    HOME MEDICATIONS: Outpatient Medications Prior to Visit  Medication Sig Dispense Refill  . Multiple Vitamin (MULTIVITAMIN WITH MINERALS) TABS tablet Take 1 tablet by mouth daily.    . naproxen (NAPROSYN) 500 MG tablet Take 1 tablet (500 mg total) by mouth 2 (two) times daily. 180 tablet 3  . ocrelizumab 600 mg in sodium chloride 0.9 % 500 mL Inject 600 mg into the vein every 6 (six) months.    . ondansetron (ZOFRAN) 8  MG tablet Take 0.5 tablets (4 mg total) by mouth daily as needed for nausea or vomiting. 30 tablet 5  . sildenafil (VIAGRA) 100 MG tablet Take 1 tablet (100 mg total) by mouth daily as needed for erectile dysfunction. 30 tablet 3  . simvastatin (ZOCOR) 20 MG tablet Take 20 mg by mouth daily.    Marland Kitchen ALPRAZolam (XANAX) 1 MG tablet Take 1 mg by mouth at bedtime as needed for sleep (take 2 tabs as needed at night).    Marland Kitchen amphetamine-dextroamphetamine (ADDERALL) 20 MG tablet TK 1 T PO  QAM TID  0  . Armodafinil (NUVIGIL) 250 MG tablet Take 1 tablet (250 mg total) by mouth daily. 90 tablet 1  . HYDROcodone-acetaminophen (NORCO) 7.5-325 MG per tablet as needed.   0  . promethazine (PHENERGAN) 12.5 MG tablet TAKE 1 TABLET (12.5 MG TOTAL) BY MOUTH EVERY 6 (SIX) HOURS AS NEEDED FOR NAUSEA OR VOMITING. 30 tablet 1  . tadalafil (CIALIS) 20 MG tablet Take 1 tablet (20 mg total) by mouth daily as needed for erectile dysfunction. 4 tablet 11   Facility-Administered Medications Prior to Visit  Medication Dose Route Frequency Provider Last Rate Last Dose  . gadopentetate dimeglumine (MAGNEVIST) injection 15 mL  15 mL Intravenous Once PRN Maylea Soria A, MD      . gadopentetate dimeglumine (MAGNEVIST) injection 16 mL  16 mL Intravenous Once PRN Jasminne Mealy, Pearletha Furl, MD        PAST MEDICAL HISTORY: Past Medical History:  Diagnosis Date  . Anxiety   . Back pain, chronic    low  . Blood  transfusion without reported diagnosis   . Depression   . Dyslipidemia   . Gait disturbance   . Gynecomastia, male   . Hx of dizziness   . Hypothyroidism   . MS (multiple sclerosis) (HCC)    probable  . PONV (postoperative nausea and vomiting)    Nausea  . Snoring    "mild" sleep apnea. Pt was not required to wear CPAP machine, instead he uses nasal spray  . Testosterone deficiency   . Vision abnormalities     PAST SURGICAL HISTORY: Past Surgical History:  Procedure Laterality Date  . BUNIONECTOMY Right   . GYNECOMASTIA EXCISION     X 2  . GYNECOMASTIA MASTECTOMY Bilateral 03/09/2014   Procedure: BILATERAL EXCISION GYNECOMASTIA;  Surgeon: Robyne Askew, MD;  Location: [facility] OR;  Service: General;  Laterality: Bilateral;  . ULNAR NERVE REPAIR Left     FAMILY HISTORY: Family History  Problem Relation Age of Onset  . Cancer Mother   . Cancer Father   . Hypertension Father   . Stroke Unknown        Grandmother  . Hypertension Brother     SOCIAL HISTORY:  Social History   Socioeconomic History  . Marital status: Single    Spouse name: Not on file  . Number of children: 0  . Years of education: College   . Highest education level: Not on file  Occupational History  . Occupation: Chef   Social Needs  . Financial resource strain: Not on file  . Food insecurity:    Worry: Not on file    Inability: Not on file  . Transportation needs:    Medical: Not on file    Non-medical: Not on file  Tobacco Use  . Smoking status: Former Games developer  . Smokeless tobacco: Never Used  Substance and Sexual Activity  . Alcohol use: Yes  Alcohol/week: 0.0 standard drinks    Comment: social  . Drug use: No  . Sexual activity: Not on file  Lifestyle  . Physical activity:    Days per week: Not on file    Minutes per session: Not on file  . Stress: Not on file  Relationships  . Social connections:    Talks on phone: Not on file    Gets together: Not on file    Attends religious  service: Not on file    Active member of club or organization: Not on file    Attends meetings of clubs or organizations: Not on file    Relationship status: Not on file  . Intimate partner violence:    Fear of current or ex partner: Not on file    Emotionally abused: Not on file    Physically abused: Not on file    Forced sexual activity: Not on file  Other Topics Concern  . 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    Patient has a 4 year college education.    Patient has no children.            PHYSICAL EXAM  Vitals:   06/10/18 1427  BP: 110/70  Pulse: 86  Weight: 190 lb (86.2 kg)  Height: 5\' 6"  (1.676 m)    Body mass index is 30.67 kg/m.   General: The patient is well-developed and well-nourished and in no acute distress   Neurologic Exam  Mental status: The patient is alert and oriented x 3 at the time of the examination. The patient has apparent normal recent and remote memory, with an apparently normal attention span and concentration ability.   Speech is normal.  Cranial nerves: Extraocular movements are full.  Facial strength and sensation is normal.  Trapezius strength is normal.  No obvious hearing deficits are noted.  Motor:  Muscle bulk and tone are normal. Strength is  5 / 5 in all 4 extremities.   Sensory: Sensory testing is intact to touch and vibration in the arms and legs.  Coordination: Cerebellar testing reveals mildly reduced right finger-nose-finger and heel-to-shin.  Gait and station: Station and gait are normal.  Tandem gait is normal.  Romberg is negative.  Ive.   Reflexes: Deep tendon reflexes are 2 at arms and ankles and 3 at knees.  There is no ankle clonus.  Marland Kitchen        DIAGNOSTIC DATA (LABS, IMAGING, TESTING) - I reviewed patient records, labs, notes, testing and imaging myself where available.  Lab Results  Component Value Date   WBC 5.2 10/11/2017   HGB 14.8 10/11/2017   HCT 44.3 10/11/2017    MCV 92 10/11/2017   PLT 180 10/11/2017      Component Value Date/Time   NA 138 03/13/2016 1129   K 4.5 03/13/2016 1129   CL 97 03/13/2016 1129   CO2 23 03/13/2016 1129   GLUCOSE 89 03/13/2016 1129   GLUCOSE 82 03/02/2014 1342   BUN 14 03/13/2016 1129   CREATININE 0.77 03/13/2016 1129   CALCIUM 10.0 03/13/2016 1129   PROT 6.4 01/26/2017 1220   ALBUMIN 4.4 01/26/2017 1220   AST 20 01/26/2017 1220   ALT 43 01/26/2017 1220   ALKPHOS 105 01/26/2017 1220   BILITOT 0.7 01/26/2017 1220   GFRNONAA 117 03/13/2016 1129   GFRAA 135 03/13/2016 1129      ASSESSMENT AND PLAN  Multiple sclerosis (HCC)  High  risk medication use  Insomnia, unspecified type   1.   Continue ocrelizumab.   Check labs, CBC and IgG/IgM/IgA.  Check MRI of the brain to determine if there is any subclinical progression. 2.   Mr. Penza does not gait, strength, sensory, balance, mood or cognitiveissues from the MS.    He has had no exacerbations and tolerates Ocrevus well.     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  3.   He will continue to be active and exercises as tolerated. 4.   Return in 6 months or sooner if there are new or worsening neurologic symptoms.    Lain Tetterton A. Epimenio Foot, MD, PhD 06/10/2018, 2:45 PM Certified in Neurology, Clinical Neurophysiology, Sleep Medicine, Pain Medicine and Neuroimaging  [facility] [address], [address] [place], [place] [address] [phone] [id number]

## 2018-06-10 NOTE — Addendum Note (Signed)
Addended by: Despina Arias A on: 06/10/2018 03:10 PM   Modules accepted: Orders

## 2018-06-11 ENCOUNTER — Telehealth: Payer: Self-pay | Admitting: Neurology

## 2018-06-11 ENCOUNTER — Telehealth: Payer: Self-pay | Admitting: *Deleted

## 2018-06-11 LAB — CBC WITH DIFFERENTIAL/PLATELET
Basophils Absolute: 0 10*3/uL (ref 0.0–0.2)
Basos: 1 %
EOS (ABSOLUTE): 0.1 10*3/uL (ref 0.0–0.4)
EOS: 1 %
HEMATOCRIT: 46.2 % (ref 37.5–51.0)
Hemoglobin: 15.2 g/dL (ref 13.0–17.7)
IMMATURE GRANULOCYTES: 1 %
Immature Grans (Abs): 0 10*3/uL (ref 0.0–0.1)
LYMPHS ABS: 1.2 10*3/uL (ref 0.7–3.1)
Lymphs: 19 %
MCH: 29.5 pg (ref 26.6–33.0)
MCHC: 32.9 g/dL (ref 31.5–35.7)
MCV: 90 fL (ref 79–97)
Monocytes Absolute: 0.5 10*3/uL (ref 0.1–0.9)
Monocytes: 8 %
NEUTROS PCT: 70 %
Neutrophils Absolute: 4.3 10*3/uL (ref 1.4–7.0)
PLATELETS: 190 10*3/uL (ref 150–450)
RBC: 5.15 x10E6/uL (ref 4.14–5.80)
RDW: 11.8 % — ABNORMAL LOW (ref 12.3–15.4)
WBC: 6.1 10*3/uL (ref 3.4–10.8)

## 2018-06-11 LAB — IGG, IGA, IGM
IGA/IMMUNOGLOBULIN A, SERUM: 137 mg/dL (ref 90–386)
IGM (IMMUNOGLOBULIN M), SRM: 39 mg/dL (ref 20–172)
IgG (Immunoglobin G), Serum: 893 mg/dL (ref 700–1600)

## 2018-06-11 NOTE — Telephone Encounter (Signed)
Called and spoke with pt. Advised labs fine per Dr. Epimenio Foot. He verbalized understanding and appreciation for call.

## 2018-06-11 NOTE — Telephone Encounter (Signed)
-----   Message from Richard A Sater, MD sent at 06/11/2018  2:45 PM EST ----- Please let the patient know that the lab work is fine.  

## 2018-06-11 NOTE — Telephone Encounter (Signed)
[employer] pending faxed clinical notes  °

## 2018-06-11 NOTE — Telephone Encounter (Signed)
MR Brain wo contrast Dr. Epimenio Foot [facility] Auth: [id number] (exp. 06/11/18 to 07/26/18). Patient is scheduled at 06/19/18 at [facility].

## 2018-06-19 ENCOUNTER — Ambulatory Visit: Payer: 59

## 2018-06-19 DIAGNOSIS — G35 Multiple sclerosis: Secondary | ICD-10-CM | POA: Diagnosis not present

## 2018-06-24 ENCOUNTER — Telehealth: Payer: Self-pay | Admitting: *Deleted

## 2018-06-24 NOTE — Telephone Encounter (Signed)
Called and spoke with pt. Advised MRI showed no new lesions per Dr. Epimenio Foot. Pt verbalized understanding.

## 2018-06-24 NOTE — Telephone Encounter (Signed)
-----   Message from Asa Lente, MD sent at 06/23/2018  4:14 PM EST ----- Please let him know that the MRI of the brain did not show any new lesions.

## 2018-08-02 ENCOUNTER — Ambulatory Visit: Payer: 59 | Admitting: Neurology

## 2018-09-05 ENCOUNTER — Ambulatory Visit: Payer: 59 | Admitting: Neurology

## 2018-10-28 ENCOUNTER — Telehealth: Payer: Self-pay | Admitting: *Deleted

## 2018-10-28 NOTE — Telephone Encounter (Signed)
Gave new Ocrevus orders to intrafusion/Liane D, RN along with MRI, OV note, labs as requested.

## 2018-12-11 ENCOUNTER — Telehealth: Payer: Self-pay | Admitting: *Deleted

## 2018-12-11 NOTE — Telephone Encounter (Signed)
Called pt. Updated med list, pharmacy, allergies on file for VV tomorrow. Explained process for VV. Asked him to call back if he did not receive email. He verbalized understanding.

## 2018-12-12 ENCOUNTER — Ambulatory Visit (INDEPENDENT_AMBULATORY_CARE_PROVIDER_SITE_OTHER): Payer: 59 | Admitting: Neurology

## 2018-12-12 ENCOUNTER — Other Ambulatory Visit: Payer: Self-pay

## 2018-12-12 ENCOUNTER — Encounter: Payer: Self-pay | Admitting: Neurology

## 2018-12-12 DIAGNOSIS — R5383 Other fatigue: Secondary | ICD-10-CM

## 2018-12-12 DIAGNOSIS — Z79899 Other long term (current) drug therapy: Secondary | ICD-10-CM

## 2018-12-12 DIAGNOSIS — R202 Paresthesia of skin: Secondary | ICD-10-CM | POA: Diagnosis not present

## 2018-12-12 DIAGNOSIS — G35 Multiple sclerosis: Secondary | ICD-10-CM

## 2018-12-12 DIAGNOSIS — R350 Frequency of micturition: Secondary | ICD-10-CM | POA: Insufficient documentation

## 2018-12-12 DIAGNOSIS — N521 Erectile dysfunction due to diseases classified elsewhere: Secondary | ICD-10-CM

## 2018-12-12 DIAGNOSIS — G4700 Insomnia, unspecified: Secondary | ICD-10-CM

## 2018-12-12 MED ORDER — SOLIFENACIN SUCCINATE 5 MG PO TABS: 5.0000 mg | ORAL_TABLET | Freq: Every day | ORAL | 5 refills | Status: DC

## 2018-12-12 MED ORDER — SILDENAFIL CITRATE 100 MG PO TABS: 100.0000 mg | ORAL_TABLET | Freq: Every day | ORAL | 3 refills | Status: DC | PRN

## 2018-12-12 NOTE — Progress Notes (Signed)
[facility]  PATIENT: Jeremy [name] DOB: 1981-01-11  REFERRING CLINICIAN: Burton Apley  HISTORY FROM: patient REASON FOR VISIT: MS, needs to change therapy   HISTORICAL  CHIEF COMPLAINT:  Chief Complaint  Patient presents with  . Multiple Sclerosis    HISTORY OF PRESENT ILLNESS:  Jeremy [name] is a 38 y.o. man with multiple sclerosis.    Update 12/12/2018: Virtual Visit via Video Note I connected with Jeremy [name] on 12/12/18 at  3:30 PM EDT by a video enabled telemedicine application and verified that I am speaking with the correct person.  I discussed the limitations of evaluation and management by telemedicine and the availability of in person appointments. The patient expressed understanding and agreed to proceed.  History of Present Illness: Does his MS has been stable.  He is on Ocrevus and tolerates it well.  He had his last treatment a few weeks ago.  He has not had any exacerbations while on Ocrevus.  We discussed getting lab work before his next treatment.  At the last visit November 2019 the IgG and IgM were within normal limits.  He denies any change in gait, strength or coordination.  He has had some burning in his heels on both sides.   This is worse when he is standing.  He has urinary frequency and urgency but no incontinence.  He needs to use the bathroom every 2-3 hours.  He also has nocturia..   Vision is fine.    He has some fatigue and sometimes sleeps poorly.   He takes Adderall as needed when more tired but not daily.  It also helps his sleepiness.     Mood is doing well.  Cognition does well.   He sometimes notes reduced focus/attention and is sometimes forgetful.  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  He is on OTC vitamin D supplementation.  His level was mildly reduced several years ago.    Observations/Objective: He is a well-developed well-nourished woman in no acute distress.  The head is normocephalic  and atraumatic.  Sclera are anicteric.  Visible skin appears normal.  The neck has a good range of motion.  Pharynx and tongue have normal appearance.  He is alert and fully oriented with fluent speech and good attention, knowledge and memory.  Extraocular muscles are intact.  Facial strength is normal.  Palatal elevation and tongue protrusion are midline.  He appears to have normal strength in the arms.  Rapid alternating movements and finger-nose-finger are performed well.  Assessment and Plan: Multiple sclerosis (HCC)  Other fatigue  High risk medication use  Paresthesia  Insomnia, unspecified type  Urinary frequency  Erectile dysfunction due to diseases classified elsewhere   1.  Continue Ocrevus.  We had a conversation about MS, Ocrevus and the COVID-19 pandemic.  It is possible that Ocrevus could increase his risk of contracting the SARS-CoV-2 virus and if he gets the virus he could have a more serious case of COVID-19.  We also discussed the CDC guidelines to help reduce the chance of contracting the virus. 2.   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  I will have him start Vesicare and he can take it as needed.  Also sildenafil for ED. 3.   Stay active and exercise as tolerated.  Continues to take vitamin D. 4.   Return  to see me in 6 months or sooner if there are new or worsening neurologic symptoms.  Follow Up Instructions: I discussed the assessment and treatment plan with the patient. The patient was provided an opportunity to ask questions and all were answered. The patient agreed with the plan and demonstrated an understanding of the instructions.    The patient was advised to call back or seek an in-person evaluation if the symptoms worsen or if the condition fails to improve as anticipated.  I provided 25 minutes of non-face-to-face time during this encounter.   ____________________________________ FROM PREVIOUS VISITS Update 06/10/2018: He is on Ocrevus for multiple sclerosis.  He tolerates it well.  His last infusion was a couple weeks ago.  He feels his MS has been stable and he does not have any exacerbations.  He denies any difficulty with gait. No stumbles or falls.   He denies weakness or numbness in the arms or legs.   Coordination is fine.    Bladder function is okay with mild frequency at times.  He denies any difficulties with his vision.   No color asymmetry.   He is not noting much trouble with fatigue now and stopped Nuvigil.  He tolerates it well.  He has some insomnia and will take Xanax to help some nights.  He denies any difficulties with depression or anxiety.  Cognition is normal.  Update 10/11/2017: He is doing well with no new exacerbations.  He started Ocrevus in 2018 and will do his next dose 10/2017.     The last brain MRI in 2017 did not show any new lesions.  The MRI of the cervical spine did not show any new lesions.  He does have 2 old ones at C4-C5 and C6.  He denies any issues with gait or strength.   Occasionally, balance is slightly off but he never falls.   He rarely has some wrist numbness (CTS?).   He has mild urinary frequency.   Vision is good  Fatigue is doing well.   Nuvigil has helped his fatigue and he tolerates it well.    He takes xanax at night to help him sleep.   Sometimes he wakes up and has trouble falling back asleep.   Mood is doing well.   Cognition is normal.    From 01/25/2017: MS:   Last year, he switched from Gilenya to ocrelizumab. He tolerates it well. He denies any recent exacerbations. On Gilenya, he had liver test elevation.   Numbness:   He notes more numbness in his hands, left worse than right.  He is a Naval architect and drives long distances.  NCV/EMG was normal in the hands 12/30/2014.     He also notes left leg numbness that comes and goes.   He has a h/o sciatica.      Gait/strength/sensation:  He denies major issues with his gait reports mild dificulty with gait with ataxia but no weakness in his legs.  However, he has not fallen.  He sometimes has trouble walking further distances.  He has some numbness in his limbs, worse in left arm.    Bladder:  He has mild urinary frequency and 0-1 nocturia. This is not bad enough to treat.    He was also having some erectile dysfunction.    Viagra helps better than Cialis.  Fatigue/sleep:   He has fatigue most days, physical more than mental. 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    He gets 5-6 hours of sleep or more.   He is on Nuvigil and Adderalll and they have helped the tiredness.   He denies sleepiness while driving or while awake. Marland Kitchen    He takes one xanax at night.     Weight loss:   He lost 12 pounds on the Keto Diet.    Mood:   He has some depression and was placed on Prozac with some benefit. It helps his symptoms partially.      Ulnar nerve:     He had his ulnar nerve transposition recently and everything went well.      MS History:   He was diagnosed in January 2012.   In 2009, he had a several week episode of right hand clumsiness associated with some electric shock sensations. He did not see a doctor about the symptoms. In December 2011, he had the onset of altered taste. A week later, he had nausea and dizziness and over the next couple days these symptoms worsened and he developed right-sided clumsiness and slurred speech. First toward his PCP who felt he might have an ear infection and he was referred to ENT. ENT evaluated him and referred him to Dr. Anne Hahn at [facility] who noted an ataxic gait and ordered an MRI of the spine and brain. Those studies were consistent with multiple sclerosis and he underwent a lumbar puncture in April 2011 that was also consistent with MS. He was started on Rebif in June 2011 and remain on the medicine until  September 2014 when a repeat MRI showed the interval development of several more lesions. He was switched to [place] but insurance would no longer cover it so we switched to Gilenya early 2016.     MRIs from 05/25/2014 show a moderate size focus in the left temporal lobe and another 10 fairly small periventricular and deep white matter foci. MRI of the cervical spine showed 2 foci, one at C4 and one at C6. There were no enhancing lesions on those MRIs.  REVIEW OF SYSTEMS:  Constitutional: No fevers, chills, sweats, or change in appetite.   Occasional insomnia Eyes: No visual changes, double vision, eye pain Ear, nose and throat: No hearing loss, ear pain, nasal congestion, sore throat Cardiovascular: No chest pain, palpitations Respiratory:  No shortness of breath at rest or with exertion.   No wheezes GastrointestinaI: No nausea, vomiting, diarrhea, abdominal pain, fecal incontinence Genitourinary:  No dysuria, urinary retention.  Mild frequency and nocturia.   Has ED Musculoskeletal:  No neck pain, back pain Integumentary: No rash, pruritus, skin lesions Neurological: as above Psychiatric: No depression or anxiety Endocrine: No palpitations, diaphoresis, change in appetite, change in weigh or increased thirst Hematologic/Lymphatic:  No anemia, purpura, petechiae. Allergic/Immunologic: No itchy/runny eyes, nasal congestion, recent allergic reactions, rashes  ALLERGIES: Allergies  Allergen Reactions  . Sulfa Antibiotics Nausea Only    HOME MEDICATIONS: Outpatient Medications Prior to Visit  Medication Sig Dispense Refill  . amphetamine-dextroamphetamine (ADDERALL) 20 MG tablet Take 1 tablet by mouth 2 (two) times daily.    . Multiple Vitamin (MULTIVITAMIN WITH MINERALS) TABS tablet Take 1 tablet by mouth daily.    . naproxen (NAPROSYN) 500 MG tablet Take 1 tablet (500 mg total) by mouth 2 (two) times daily. 180 tablet 3  . ocrelizumab 600 mg in sodium chloride 0.9 % 500 mL Inject 600  mg into the vein every 6 (six) months.    Marland Kitchen  ondansetron (ZOFRAN) 8 MG tablet Take 0.5 tablets (4 mg total) by mouth daily as needed for nausea or vomiting. 30 tablet 5  . simvastatin (ZOCOR) 20 MG tablet Take 20 mg by mouth daily.    . sildenafil (VIAGRA) 100 MG tablet Take 1 tablet (100 mg total) by mouth daily as needed for erectile dysfunction. 30 tablet 3   Facility-Administered Medications Prior to Visit  Medication Dose Route Frequency Provider Last Rate Last Dose  . gadopentetate dimeglumine (MAGNEVIST) injection 15 mL  15 mL Intravenous Once PRN ,  A, MD      . gadopentetate dimeglumine (MAGNEVIST) injection 16 mL  16 mL Intravenous Once PRN , Pearletha Furlichard A, MD        PAST MEDICAL HISTORY: Past Medical History:  Diagnosis Date  . Anxiety   . Back pain, chronic    low  . Blood transfusion without reported diagnosis   . Depression   . Dyslipidemia   . Gait disturbance   . Gynecomastia, male   . Hx of dizziness   . Hypothyroidism   . MS (multiple sclerosis) (HCC)    probable  . PONV (postoperative nausea and vomiting)    Nausea  . Snoring    "mild" sleep apnea. Pt was not required to wear CPAP machine, instead he uses nasal spray  . Testosterone deficiency   . Vision abnormalities     PAST SURGICAL HISTORY: Past Surgical History:  Procedure Laterality Date  . BUNIONECTOMY Right   . GYNECOMASTIA EXCISION     X 2  . GYNECOMASTIA MASTECTOMY Bilateral 03/09/2014   Procedure: BILATERAL EXCISION GYNECOMASTIA;  Surgeon: Robyne AskewPaul S Toth III, MD;  Location: [facility] OR;  Service: General;  Laterality: Bilateral;  . ULNAR NERVE REPAIR Left     FAMILY HISTORY: Family History  Problem Relation Age of Onset  . Cancer Mother   . Cancer Father   . Hypertension Father   . Stroke Unknown        Grandmother  . Hypertension Brother     SOCIAL HISTORY:  Social History   Socioeconomic History  . Marital status: Single    Spouse name: Not on file  . Number of children:  0  . Years of education: College   . Highest education level: Not on file  Occupational History  . Occupation: Chef   Social Needs  . Financial resource strain: Not on file  . Food insecurity:    Worry: Not on file    Inability: Not on file  . Transportation needs:    Medical: Not on file    Non-medical: Not on file  Tobacco Use  . Smoking status: Former Games developermoker  . Smokeless tobacco: Never Used  Substance and Sexual Activity  . Alcohol use: Yes    Alcohol/week: 0.0 standard drinks    Comment: social  . Drug use: No  . Sexual activity: Not on file  Lifestyle  . Physical activity:    Days per week: Not on file    Minutes per session: Not on file  . Stress: Not on file  Relationships  . Social connections:    Talks on phone: Not on file    Gets together: Not on file    Attends religious service: Not on file    Active member of club or organization: Not on file    Attends meetings of clubs or organizations: Not on file    Relationship status: Not on file  . Intimate partner violence:    Fear  of current or ex partner: Not on file    Emotionally abused: Not on file    Physically abused: Not on file    Forced sexual activity: Not on file  Other Topics Concern  . 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    Patient has a 4 year college education.    Patient has no children.            PHYSICAL EXAM  There were no vitals filed for this visit.  There is no height or weight on file to calculate BMI.   General: The patient is well-developed and well-nourished and in no acute distress   Neurologic Exam  Mental status: The patient is alert and oriented x 3 at the time of the examination. The patient has apparent normal recent and remote memory, with an apparently normal attention span and concentration ability.   Speech is normal.  Cranial nerves: Extraocular movements are full.  Facial strength and sensation is normal.  Trapezius  strength is normal.  No obvious hearing deficits are noted.  Motor:  Muscle bulk and tone are normal. Strength is  5 / 5 in all 4 extremities.   Sensory: Sensory testing is intact to touch and vibration in the arms and legs.  Coordination: Cerebellar testing reveals mildly reduced right finger-nose-finger and heel-to-shin.  Gait and station: Station and gait are normal.  Tandem gait is normal.  Romberg is negative.  Ive.   Reflexes: Deep tendon reflexes are 2 at arms and ankles and 3 at knees.  There is no ankle clonus.  Pearletha Furl. Epimenio Foot, MD, PhD 12/12/2018, 3:53 PM Certified in Neurology, Clinical Neurophysiology, Sleep Medicine, Pain Medicine and Neuroimaging  [facility] [address], [address] [place], [place] [address] [phone]

## 2018-12-19 ENCOUNTER — Other Ambulatory Visit: Payer: Self-pay | Admitting: Neurology

## 2018-12-19 MED ORDER — FESOTERODINE FUMARATE ER 4 MG PO TB24: 4.0000 mg | ORAL_TABLET | Freq: Every day | ORAL | 11 refills | Status: DC

## 2019-04-10 ENCOUNTER — Telehealth: Payer: Self-pay | Admitting: *Deleted

## 2019-04-10 NOTE — Telephone Encounter (Signed)
Pt came and signed consent form. I faxed completed/signed pt consent form and prescriber service form to [facility] at [phone]. Received fax confirmation. Sent copy to be scanned to epic and gave copy to infusion suite.

## 2019-04-10 NOTE — Telephone Encounter (Signed)
Per Graylon Gunning, RN intrafusion, Pt scheduled for next Ocrevus 05/15/2019

## 2019-04-10 NOTE — Telephone Encounter (Signed)
Called Jeremy [name]. He was last seen 12/12/18 and has no f/u scheduled after VV. Scheduled one with Dr. Felecia Shelling 06/12/19 at 11:30am. He preferred to see MD and not NP.   His last Ocrevus infusion was 10/2018. He states he does not have October infusion scheduled yet. I sent message to Liane to contact him. He has not had any changes in his insurance this year.   He also is going to come by today to sign updated Ocrevus patient consent form. Old one he signed expired. I placed up front for him to sign

## 2019-06-12 ENCOUNTER — Other Ambulatory Visit: Payer: Self-pay

## 2019-06-12 ENCOUNTER — Encounter: Payer: Self-pay | Admitting: Neurology

## 2019-06-12 ENCOUNTER — Ambulatory Visit: Payer: 59 | Admitting: Neurology

## 2019-06-12 VITALS — BP 120/71 | HR 68 | Temp 97.9°F | Ht 66.0 in | Wt 185.0 lb

## 2019-06-12 DIAGNOSIS — Z79899 Other long term (current) drug therapy: Secondary | ICD-10-CM | POA: Diagnosis not present

## 2019-06-12 DIAGNOSIS — G35 Multiple sclerosis: Secondary | ICD-10-CM | POA: Diagnosis not present

## 2019-06-12 DIAGNOSIS — R5383 Other fatigue: Secondary | ICD-10-CM | POA: Diagnosis not present

## 2019-06-12 DIAGNOSIS — R269 Unspecified abnormalities of gait and mobility: Secondary | ICD-10-CM

## 2019-06-12 DIAGNOSIS — R350 Frequency of micturition: Secondary | ICD-10-CM

## 2019-06-12 MED ORDER — OXYBUTYNIN CHLORIDE 5 MG PO TABS: 5.0000 mg | ORAL_TABLET | Freq: Two times a day (BID) | ORAL | 11 refills | Status: DC

## 2019-06-12 NOTE — Progress Notes (Addendum)
[facility]  PATIENT: Jeremy [name] DOB: June 18, 1981  REFERRING CLINICIAN: Lorene Dy  HISTORY FROM: patient REASON FOR VISIT: MS, needs to change therapy   HISTORICAL  CHIEF COMPLAINT:  Chief Complaint  Patient presents with  . Follow-up    rm 12 here for 6 month f/u   . Multiple Sclerosis    On ocrevus- last infusion was last week     HISTORY OF PRESENT ILLNESS:  Benjamim [name] is a 38 y.o. man with relapsing remitting multiple sclerosis.    Update 06/12/2019: He feels his MS is stable.    He is on Ocrevus and tolerates it well.    His last infusion was last week.   No exacerbation.  He is walking well.  He denies difficulty with balance, strength or sensation.   He occasionally has tingling in an arm for a few minutes.   He has some nocturia.  Vesicare and Toviaz generics were too expensive so he never started these.  He uses sildenafil as needed for ED.  He notes reduced focus and attention (from PCP).    He feels Adderall helps and and also helps his MS fatigue.  He sleeps well most nights.  He feels mood is doing well.    MS History:   He was diagnosed in January 2012.   In 2009, he had a several week episode of right hand clumsiness associated with some electric shock sensations. He did not see a doctor about the symptoms. In December 2011, he had the onset of altered taste. A week later, he had nausea and dizziness and over the next couple days these symptoms worsened and he developed right-sided clumsiness and slurred speech. First toward his PCP who felt he might have an ear infection and he was referred to ENT. ENT evaluated him and referred him to Dr. Jannifer Franklin at [facility] who noted an ataxic gait and ordered an MRI of the spine and brain. Those studies were consistent with multiple sclerosis and he underwent a lumbar puncture in April 2011 that was also consistent with MS. He was started on Rebif in June 2011 and remain on the medicine until  September 2014 when a repeat MRI showed the interval development of several more lesions. He was switched to [place] but insurance would no longer cover it so we switched to [place] early 2016.  He had elevated liver enzyme tests on [place] and was switched to every other day but transaminase elevation persisted.  Therefore, he switched to [place] in late 2017.Marland Kitchen     MRIs from 05/25/2014 show a moderate size focus in the left temporal lobe and another 10 fairly small periventricular and deep white matter foci. MRI of the cervical spine showed 2 foci, one at C4 and one at C6. There were no enhancing lesions on those MRIs.  MRI of the brain 06/16/2015 showed no new lesions.    MRI of the brain 03/15/2016 showed no new lesions.  MRI of the cervical spine 02/08/2017 showed 2 small foci laterally to the right at C4-C5 and laterally to the left at C6.  These were present on the previous MRI.  MRI of the brain 06/19/2018 showed no new lesions.  REVIEW OF SYSTEMS:  Constitutional: No fevers, chills, sweats, or change in appetite.   Occasional insomnia Eyes: No visual changes, double vision, eye pain Ear, nose and throat: No hearing loss, ear pain, nasal congestion, sore throat Cardiovascular: No chest pain, palpitations Respiratory:  No shortness of breath at  rest or with exertion.   No wheezes GastrointestinaI: No nausea, vomiting, diarrhea, abdominal pain, fecal incontinence Genitourinary:  No dysuria, urinary retention.  Mild frequency and nocturia.   Has ED Musculoskeletal:  No neck pain, back pain Integumentary: No rash, pruritus, skin lesions Neurological: as above Psychiatric: No depression or anxiety Endocrine: No palpitations, diaphoresis, change in appetite, change in weigh or increased thirst Hematologic/Lymphatic:  No anemia, purpura, petechiae. Allergic/Immunologic: No itchy/runny eyes, nasal congestion, recent allergic reactions, rashes  ALLERGIES: Allergies  Allergen Reactions  .  Sulfa Antibiotics Nausea Only    HOME MEDICATIONS: Outpatient Medications Prior to Visit  Medication Sig Dispense Refill  . amphetamine-dextroamphetamine (ADDERALL) 20 MG tablet Take 1 tablet by mouth 2 (two) times daily.    . Multiple Vitamin (MULTIVITAMIN WITH MINERALS) TABS tablet Take 1 tablet by mouth daily.    . naproxen (NAPROSYN) 500 MG tablet Take 1 tablet (500 mg total) by mouth 2 (two) times daily. 180 tablet 3  . ocrelizumab 600 mg in sodium chloride 0.9 % 500 mL Inject 600 mg into the vein every 6 (six) months.    . ondansetron (ZOFRAN) 8 MG tablet Take 0.5 tablets (4 mg total) by mouth daily as needed for nausea or vomiting. 30 tablet 5  . sildenafil (VIAGRA) 100 MG tablet Take 1 tablet (100 mg total) by mouth daily as needed for erectile dysfunction. 30 tablet 3  . simvastatin (ZOCOR) 20 MG tablet Take 20 mg by mouth daily.    Marland Kitchen zolpidem (AMBIEN) 10 MG tablet Take 10 mg by mouth at bedtime as needed for sleep.    . fesoterodine (TOVIAZ) 4 MG TB24 tablet Take 1 tablet (4 mg total) by mouth daily. 30 tablet 11  . solifenacin (VESICARE) 5 MG tablet Take 1 tablet (5 mg total) by mouth daily. 30 tablet 5   Facility-Administered Medications Prior to Visit  Medication Dose Route Frequency Provider Last Rate Last Dose  . gadopentetate dimeglumine (MAGNEVIST) injection 15 mL  15 mL Intravenous Once PRN Stpehen Petitjean A, MD      . gadopentetate dimeglumine (MAGNEVIST) injection 16 mL  16 mL Intravenous Once PRN Vickee Mormino, Pearletha Furl, MD        PAST MEDICAL HISTORY: Past Medical History:  Diagnosis Date  . Anxiety   . Back pain, chronic    low  . Blood transfusion without reported diagnosis   . Depression   . Dyslipidemia   . Gait disturbance   . Gynecomastia, male   . Hx of dizziness   . Hypothyroidism   . MS (multiple sclerosis) (HCC)    probable  . PONV (postoperative nausea and vomiting)    Nausea  . Snoring    "mild" sleep apnea. Pt was not required to wear CPAP machine,  instead he uses nasal spray  . Testosterone deficiency   . Vision abnormalities     PAST SURGICAL HISTORY: Past Surgical History:  Procedure Laterality Date  . BUNIONECTOMY Right   . GYNECOMASTIA EXCISION     X 2  . GYNECOMASTIA MASTECTOMY Bilateral 03/09/2014   Procedure: BILATERAL EXCISION GYNECOMASTIA;  Surgeon: Robyne Askew, MD;  Location: [facility] OR;  Service: General;  Laterality: Bilateral;  . ULNAR NERVE REPAIR Left     FAMILY HISTORY: Family History  Problem Relation Age of Onset  . Cancer Mother   . Cancer Father   . Hypertension Father   . Stroke Unknown        Grandmother  . Hypertension Brother  SOCIAL HISTORY:  Social History   Socioeconomic History  . Marital status: Single    Spouse name: Not on file  . Number of children: 0  . Years of education: College   . Highest education level: Not on file  Occupational History  . Occupation: Chef   Social Needs  . Financial resource strain: Not on file  . Food insecurity    Worry: Not on file    Inability: Not on file  . Transportation needs    Medical: Not on file    Non-medical: Not on file  Tobacco Use  . Smoking status: Former Games developermoker  . Smokeless tobacco: Never Used  Substance and Sexual Activity  . Alcohol use: Yes    Alcohol/week: 0.0 standard drinks    Comment: social  . Drug use: No  . Sexual activity: Not on file  Lifestyle  . Physical activity    Days per week: Not on file    Minutes per session: Not on file  . Stress: Not on file  Relationships  . Social Musicianconnections    Talks on phone: Not on file    Gets together: Not on file    Attends religious service: Not on file    Active member of club or organization: Not on file    Attends meetings of clubs or organizations: Not on file    Relationship status: Not on file  . Intimate partner violence    Fear of current or ex partner: Not on file    Emotionally abused: Not on file    Physically abused: Not on file    Forced sexual  activity: Not on file  Other Topics Concern  . 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    Patient has a 4 year college education.    Patient has no children.            PHYSICAL EXAM  Vitals:   06/12/19 1138  BP: 120/71  Pulse: 68  Temp: 97.9 F (36.6 C)  TempSrc: Temporal  Weight: 185 lb (83.9 kg)  Height: 5\' 6"  (1.676 m)    Body mass index is 29.86 kg/m.   General: The patient is well-developed and well-nourished and in no acute distress   Neurologic Exam  Mental status: The patient is alert and oriented x 3 at the time of the examination. The patient has apparent normal recent and remote memory, with an apparently normal attention span and concentration ability.   Speech is normal.  Cranial nerves: Extraocular movements are full.  Facial strength and sensation is normal.  Trapezius strength is normal.  No obvious hearing deficits are noted.  Motor:  Muscle bulk and tone are normal. Strength is  5 / 5 in all 4 extremities.   Sensory: Sensory testing is intact to touch and vibration in the arms and legs.  Coordination: Cerebellar testing reveals mildly reduced right finger-nose-finger and heel-to-shin.  Gait and station: Station and gait are normal.  Tandem gait is normal.  Romberg is negative.  Ive.   Reflexes: Deep tendon reflexes are 2 at arms and ankles and 3 at knees.  There is no ankle clonus.  .      _________________________________________________ Multiple sclerosis (HCC) - Plan: CMP, CBC with Differential/Platelets, IgG, IgA, IgM  High risk medication use - Plan: CMP, CBC with Differential/Platelets, IgG, IgA, IgM  Abnormality of gait  Other fatigue  Urinary frequency   1.  Continue Ocrevus.  We will check CMP, CBC and IgG/IgM/IgA. 2.   Oxybutynin for bladder. 3.   Stay active and exercise as tolerated.  Take OTC vitamin D. 4.   Return in 6 months or sooner if there are new or worsening neurologic  symptoms.  Since she is stable, we can wait till 2021 to check another MRI of the brain.  Jaisen Wiltrout A. Epimenio Foot, MD, PhD 06/12/2019, 5:33 PM Certified in Neurology, Clinical Neurophysiology, Sleep Medicine, Pain Medicine and Neuroimaging  [facility] [address] [place], [place] [address] [phone]

## 2019-06-13 LAB — COMPREHENSIVE METABOLIC PANEL
ALT: 58 IU/L — ABNORMAL HIGH (ref 0–44)
AST: 24 IU/L (ref 0–40)
Albumin/Globulin Ratio: 2.2 (ref 1.2–2.2)
Albumin: 4.8 g/dL (ref 4.0–5.0)
Alkaline Phosphatase: 96 IU/L (ref 39–117)
BUN/Creatinine Ratio: 15 (ref 9–20)
BUN: 15 mg/dL (ref 6–20)
Bilirubin Total: 1 mg/dL (ref 0.0–1.2)
CO2: 21 mmol/L (ref 20–29)
Calcium: 9.5 mg/dL (ref 8.7–10.2)
Chloride: 105 mmol/L (ref 96–106)
Creatinine, Ser: 1.01 mg/dL (ref 0.76–1.27)
GFR calc Af Amer: 108 mL/min/{1.73_m2} (ref >59)
GFR calc non Af Amer: 93 mL/min/{1.73_m2} (ref >59)
Globulin, Total: 2.2 g/dL (ref 1.5–4.5)
Glucose: 91 mg/dL (ref 65–99)
Potassium: 4.6 mmol/L (ref 3.5–5.2)
Sodium: 142 mmol/L (ref 134–144)
Total Protein: 7 g/dL (ref 6.0–8.5)

## 2019-06-13 LAB — CBC WITH DIFFERENTIAL/PLATELET
Basophils Absolute: 0.1 10*3/uL (ref 0.0–0.2)
Basos: 1 %
EOS (ABSOLUTE): 0.1 10*3/uL (ref 0.0–0.4)
Eos: 1 %
Hematocrit: 46.2 % (ref 37.5–51.0)
Hemoglobin: 15.8 g/dL (ref 13.0–17.7)
Immature Grans (Abs): 0.1 10*3/uL (ref 0.0–0.1)
Immature Granulocytes: 2 %
Lymphocytes Absolute: 1.5 10*3/uL (ref 0.7–3.1)
Lymphs: 25 %
MCH: 31.1 pg (ref 26.6–33.0)
MCHC: 34.2 g/dL (ref 31.5–35.7)
MCV: 91 fL (ref 79–97)
Monocytes Absolute: 0.6 10*3/uL (ref 0.1–0.9)
Monocytes: 9 %
Neutrophils Absolute: 3.8 10*3/uL (ref 1.4–7.0)
Neutrophils: 62 %
Platelets: 188 10*3/uL (ref 150–450)
RBC: 5.08 x10E6/uL (ref 4.14–5.80)
RDW: 11.8 % (ref 11.6–15.4)
WBC: 6.1 10*3/uL (ref 3.4–10.8)

## 2019-06-13 LAB — IGG, IGA, IGM
IgA/Immunoglobulin A, Serum: 122 mg/dL (ref 90–386)
IgG (Immunoglobin G), Serum: 897 mg/dL (ref 603–1613)
IgM (Immunoglobulin M), Srm: 38 mg/dL (ref 20–172)

## 2019-06-16 ENCOUNTER — Telehealth: Payer: Self-pay

## 2019-06-16 NOTE — Telephone Encounter (Signed)
Pt notified of results and verbalized understanding  

## 2019-06-16 NOTE — Telephone Encounter (Signed)
-----   Message from Britt Bottom, MD sent at 06/13/2019 11:14 AM EST ----- Please let the patient know that the lab work is fine.

## 2019-06-23 ENCOUNTER — Telehealth: Payer: Self-pay | Admitting: Neurology

## 2019-06-23 NOTE — Telephone Encounter (Signed)
It is probably best if his primary care does this as I will know if this is a hernia or something else.

## 2019-06-23 NOTE — Telephone Encounter (Signed)
Dr. Felecia Shelling- is this something you will do or do you want him to request from PCP?

## 2019-06-23 NOTE — Telephone Encounter (Signed)
Called pt and advised him to contact PCP about referral. Her verbalized understanding.

## 2019-06-23 NOTE — Telephone Encounter (Signed)
Patient called requesting a GI referral for his lump on his right abdomen side. Patient states they also have pain on their side. Please follow up.

## 2019-07-14 ENCOUNTER — Other Ambulatory Visit: Payer: Self-pay | Admitting: General Surgery

## 2019-07-14 DIAGNOSIS — R1900 Intra-abdominal and pelvic swelling, mass and lump, unspecified site: Secondary | ICD-10-CM

## 2019-07-24 ENCOUNTER — Ambulatory Visit
Admission: RE | Admit: 2019-07-24 | Discharge: 2019-07-24 | Disposition: A | Discharge status: Home / Self Care | Payer: 59 | Source: Ambulatory Visit | Attending: General Surgery | Admitting: General Surgery

## 2019-07-24 DIAGNOSIS — R1900 Intra-abdominal and pelvic swelling, mass and lump, unspecified site: Secondary | ICD-10-CM

## 2019-07-24 MED ORDER — IOPAMIDOL (ISOVUE-300) INJECTION 61%
100.0000 mL | Freq: Once | INTRAVENOUS | Status: AC | PRN
  Administered 2019-07-24: 100 mL via INTRAVENOUS

## 2019-08-22 ENCOUNTER — Emergency Department: Payer: 59

## 2019-08-22 ENCOUNTER — Other Ambulatory Visit: Payer: Self-pay

## 2019-08-22 ENCOUNTER — Encounter: Payer: Self-pay | Admitting: Emergency Medicine

## 2019-08-22 ENCOUNTER — Emergency Department (INDEPENDENT_AMBULATORY_CARE_PROVIDER_SITE_OTHER)
Admission: EM | Admit: 2019-08-22 | Discharge: 2019-08-22 | Disposition: A | Discharge status: Other Acute Inpatient Hospital | Payer: 59 | Source: Home / Self Care | Attending: Family Medicine | Admitting: Family Medicine

## 2019-08-22 ENCOUNTER — Encounter (HOSPITAL_COMMUNITY): Payer: Self-pay

## 2019-08-22 ENCOUNTER — Emergency Department (HOSPITAL_COMMUNITY)
Admission: EM | Admit: 2019-08-22 | Discharge: 2019-08-22 | Disposition: A | Discharge status: Home / Self Care | Payer: 59 | Attending: Emergency Medicine | Admitting: Emergency Medicine

## 2019-08-22 DIAGNOSIS — E039 Hypothyroidism, unspecified: Secondary | ICD-10-CM | POA: Diagnosis not present

## 2019-08-22 DIAGNOSIS — Z79899 Other long term (current) drug therapy: Secondary | ICD-10-CM | POA: Insufficient documentation

## 2019-08-22 DIAGNOSIS — I82411 Acute embolism and thrombosis of right femoral vein: Secondary | ICD-10-CM | POA: Diagnosis not present

## 2019-08-22 DIAGNOSIS — Z87891 Personal history of nicotine dependence: Secondary | ICD-10-CM | POA: Insufficient documentation

## 2019-08-22 DIAGNOSIS — I82401 Acute embolism and thrombosis of unspecified deep veins of right lower extremity: Secondary | ICD-10-CM | POA: Diagnosis not present

## 2019-08-22 LAB — CBC WITH DIFFERENTIAL/PLATELET
Abs Immature Granulocytes: 0.12 10*3/uL — ABNORMAL HIGH (ref 0.00–0.07)
Basophils Absolute: 0 10*3/uL (ref 0.0–0.1)
Basophils Relative: 1 %
Eosinophils Absolute: 0.2 10*3/uL (ref 0.0–0.5)
Eosinophils Relative: 2 %
HCT: 41.9 % (ref 39.0–52.0)
Hemoglobin: 13.4 g/dL (ref 13.0–17.0)
Immature Granulocytes: 2 %
Lymphocytes Relative: 10 %
Lymphs Abs: 0.9 10*3/uL (ref 0.7–4.0)
MCH: 30.3 pg (ref 26.0–34.0)
MCHC: 32 g/dL (ref 30.0–36.0)
MCV: 94.8 fL (ref 80.0–100.0)
Monocytes Absolute: 0.6 10*3/uL (ref 0.1–1.0)
Monocytes Relative: 8 %
Neutro Abs: 6.4 10*3/uL (ref 1.7–7.7)
Neutrophils Relative %: 77 %
Platelets: 219 10*3/uL (ref 150–400)
RBC: 4.42 MIL/uL (ref 4.22–5.81)
RDW: 11.9 % (ref 11.5–15.5)
WBC: 8.3 10*3/uL (ref 4.0–10.5)
nRBC: 0 % (ref 0.0–0.2)

## 2019-08-22 LAB — BASIC METABOLIC PANEL
Anion gap: 8 (ref 5–15)
BUN: 15 mg/dL (ref 6–20)
CO2: 26 mmol/L (ref 22–32)
Calcium: 9.1 mg/dL (ref 8.9–10.3)
Chloride: 106 mmol/L (ref 98–111)
Creatinine, Ser: 0.85 mg/dL (ref 0.61–1.24)
GFR calc Af Amer: >60 mL/min (ref >60)
GFR calc non Af Amer: >60 mL/min (ref >60)
Glucose, Bld: 117 mg/dL — ABNORMAL HIGH (ref 70–99)
Potassium: 4.1 mmol/L (ref 3.5–5.1)
Sodium: 140 mmol/L (ref 135–145)

## 2019-08-22 MED ORDER — RIVAROXABAN 15 MG PO TABS
15.0000 mg | ORAL_TABLET | Freq: Once | ORAL | Status: AC
  Administered 2019-08-22: 15 mg via ORAL
  Filled 2019-08-22: qty 1

## 2019-08-22 MED ORDER — RIVAROXABAN (XARELTO) VTE STARTER PACK (15 & 20 MG): ORAL_TABLET | ORAL | 0 refills | Status: DC

## 2019-08-22 MED ORDER — HEPARIN (PORCINE) 25000 UT/250ML-% IV SOLN: 1400.0000 [IU]/h | INTRAVENOUS | Status: DC

## 2019-08-22 MED ORDER — HEPARIN BOLUS VIA INFUSION
2500.0000 [IU] | Freq: Once | INTRAVENOUS | Status: DC
  Filled 2019-08-22: qty 2500

## 2019-08-22 MED ORDER — RIVAROXABAN (XARELTO) EDUCATION KIT FOR DVT/PE PATIENTS
PACK | Freq: Once | Status: AC
  Filled 2019-08-22: qty 1

## 2019-08-22 NOTE — ED Provider Notes (Signed)
[facility]    CSN: [id number] Arrival date & time: 08/22/19  0802      History   Chief Complaint Chief Complaint  Patient presents with  . Leg Pain    HPI Terris Germano is a 39 y.o. male.   Patient complains of pain/tenderness in his right posterior calf for 5 days.  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  He has pain with walking flexing his ankle.  He feels well otherwise.  No history of DVT.  No chest pain or shortness of breath.   Leg Pain Location:  Leg Time since incident:  5 days Injury: no   Leg location:  R lower leg Pain details:    Quality:  Aching and burning   Radiates to:  Does not radiate   Severity:  Moderate   Onset quality:  Gradual   Timing:  Constant   Progression:  Unchanged Chronicity:  New Prior injury to area:  No Relieved by:  Nothing Worsened by:  Bearing weight and activity Ineffective treatments:  NSAIDs Associated symptoms: swelling   Associated symptoms: no decreased ROM, no fatigue, no fever, no muscle weakness, no numbness and no tingling     Past Medical History:  Diagnosis Date  . Anxiety   . Back pain, chronic    low  . Blood transfusion without reported diagnosis   . Depression   . Dyslipidemia   . Gait disturbance   . Gynecomastia, male   . Hx of dizziness   . Hypothyroidism   . MS (multiple sclerosis) (HCC)    probable  . PONV (postoperative nausea and vomiting)    Nausea  . Snoring    "mild" sleep apnea. Pt was not required to wear CPAP machine, instead he uses nasal spray  . Testosterone deficiency   . Vision abnormalities     Patient Active Problem List   Diagnosis Date Noted  . Urinary frequency 12/12/2018  . Insomnia 06/10/2018  . Carpal tunnel syndrome of left wrist 01/25/2017  . Low serum vitamin D 11/10/2015  . Depression with anxiety 04/20/2015  . Low testosterone 04/20/2015  . Paresthesia 12/09/2014  . Ulnar nerve entrapment 12/09/2014  . High risk medication use 12/09/2014  .  Pronation deformity of both feet 12/07/2014  . Metatarsal deformity 12/07/2014  . Metatarsalgia of both feet 12/07/2014  . Bradycardia, sinus 09/30/2014  . Other fatigue 09/30/2014  . Shift work sleep disorder 08/25/2014  . Erectile dysfunction 08/25/2014  . Ataxic gait 08/25/2014  . Subareolar gynecomastia in male 01/19/2014  . Multiple sclerosis ([place]) 03/10/2013  . Abnormality of gait 03/10/2013    Past Surgical History:  Procedure Laterality Date  . BUNIONECTOMY Right   . GYNECOMASTIA EXCISION     X 2  . GYNECOMASTIA MASTECTOMY Bilateral 03/09/2014   Procedure: BILATERAL EXCISION GYNECOMASTIA;  Surgeon: Merrie Roof, MD;  Location: [facility];  Service: General;  Laterality: Bilateral;  . ULNAR NERVE REPAIR Left        Home Medications    Prior to Admission medications   Medication Sig Start Date End Date Taking? Authorizing Provider  amphetamine-dextroamphetamine (ADDERALL) 20 MG tablet Take 1 tablet by mouth 2 (two) times daily. 11/18/18   [provider]  Multiple Vitamin (MULTIVITAMIN WITH MINERALS) TABS tablet Take 1 tablet by mouth daily.    [provider]  naproxen (NAPROSYN) 500 MG tablet Take 1 tablet (500 mg total) by mouth 2 (two) times daily. 08/11/16   Arlice Colt  A, MD  ocrelizumab 600 mg in sodium chloride 0.9 % 500 mL Inject 600 mg into the vein every 6 (six) months.    [provider]  ondansetron (ZOFRAN) 8 MG tablet Take 0.5 tablets (4 mg total) by mouth daily as needed for nausea or vomiting. 10/11/17   Sater, Pearletha Furl, MD  oxybutynin (DITROPAN) 5 MG tablet Take 1 tablet (5 mg total) by mouth 2 (two) times daily. 06/12/19   Sater, Pearletha Furl, MD  sildenafil (VIAGRA) 100 MG tablet Take 1 tablet (100 mg total) by mouth daily as needed for erectile dysfunction. 12/12/18   Sater, Pearletha Furl, MD  simvastatin (ZOCOR) 20 MG tablet Take 20 mg by mouth daily.    [provider]  zolpidem (AMBIEN) 10 MG tablet Take 10 mg by mouth at  bedtime as needed for sleep.    [provider]    Family History Family History  Problem Relation Age of Onset  . Cancer Mother   . Cancer Father   . Hypertension Father   . Stroke Other        Grandmother  . Hypertension Brother     Social History Social History   Tobacco Use  . Smoking status: Former Games developer  . Smokeless tobacco: Never Used  Substance Use Topics  . Alcohol use: Yes    Alcohol/week: 0.0 standard drinks    Comment: social  . Drug use: No     Allergies   Sulfa antibiotics   Review of Systems Review of Systems  Constitutional: Negative for fatigue and fever.  Respiratory: Negative for cough, chest tightness, shortness of breath, wheezing and stridor.   Cardiovascular: Negative for chest pain and palpitations.  All other systems reviewed and are negative.    Physical Exam Triage Vital Signs ED Triage Vitals  Enc Vitals Group     BP 08/22/19 0822 128/71     Pulse Rate 08/22/19 0822 95     Resp --      Temp 08/22/19 0822 98.4 F (36.9 C)     Temp Source 08/22/19 0822 Oral     SpO2 08/22/19 0822 97 %     Weight 08/22/19 0820 175 lb (79.4 kg)     Height --      Head Circumference --      Peak Flow --      Pain Score 08/22/19 0819 9     Pain Loc --      Pain Edu? --      Excl. in GC? --    No data found.  Updated Vital Signs BP 128/71 (BP Location: Right Arm)   Pulse 95   Temp 98.4 F (36.9 C) (Oral)   Wt 79.4 kg   SpO2 97%   BMI 28.25 kg/m   Visual Acuity Right Eye Distance:   Left Eye Distance:   Bilateral Distance:    Right Eye Near:   Left Eye Near:    Bilateral Near:     Physical Exam Vitals and nursing note reviewed.  Constitutional:      General: He is not in acute distress. HENT:     Head: Normocephalic.  Eyes:     Pupils: Pupils are equal, round, and reactive to light.  Cardiovascular:     Rate and Rhythm: Normal rate.     Heart sounds: Normal heart sounds.  Pulmonary:     Breath sounds: Normal  breath sounds.  Musculoskeletal:     Right lower leg: Tenderness present. No swelling  or bony tenderness. No edema.     Left lower leg: No edema.       Legs:     Comments: Right posterior calf tender to palpation.  Pain elicited by resisted dorsiflexion of foot.  Pedal pulses intact.  No erythema or warmth.  Skin:    General: Skin is dry.     Findings: No rash.  Neurological:     Mental Status: He is alert.      UC Treatments / Results  Labs (all labs ordered are listed, but only abnormal results are displayed) Labs Reviewed - No data to display  EKG   Radiology No results found.  Procedures Procedures (including critical care time)  Medications Ordered in UC Medications - No data to display  Initial Impression / Assessment and Plan / UC Course  I have reviewed the triage vital signs and the nursing notes.  Pertinent labs & imaging results that were available during my care of the patient were reviewed by me and considered in my medical decision making (see chart for details).    Discussed with patient's PCP:  Dr. Renne Crigler.  Patient advised to proceed immediately to [facility] ED for inpatient evaluation/management.  Patient's vital signs stable.  Wife to drive (private vehicle)   Final Clinical Impressions(s) / UC Diagnoses   Final diagnoses:  Acute deep vein thrombosis (DVT) of right lower extremity, unspecified vein [facility])     Discharge Instructions     Proceed immediately to hospital emergency department    ED Prescriptions    None        Lattie Haw, MD 08/22/19 1053

## 2019-08-22 NOTE — ED Triage Notes (Signed)
Pt states Sunday he developed pain in his right calf. Getting worse, swollen and burning. States when it is elevated he has no pain. He is a driver for a living.

## 2019-08-22 NOTE — ED Provider Notes (Signed)
[facility] DEPT Provider Note   CSN: [id number] Arrival date & time: 08/22/19  1144     History Chief Complaint  Patient presents with  . DVT    Jeremy [name] is a 39 y.o. male.  The history is provided by the patient and medical records. No language interpreter was used.     39 year old male with history of chronic back pain, anxiety, depression, hyperlipidemia, MS, who is a truck driver, sent here from urgent care center for evaluation of right calf pain.  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  For the past 5 days he had noticed progressive worsening pain to his right calf region.  Pain is described as a throbbing burning sensation, with increased swelling and feels like a pulled muscle.  No associated fever chills, no chest pain no shortness of breath and no hemoptysis.  No lightheadedness or dizziness.  No numbness.  Pain is moderate in severity.  He was seen at the urgent care center today for his complaint.  A venous Doppler study was performed showing extensive DVTs of the right lower leg seen from the femoral vein into multiple calf branches.  He was told to come to the ER to be admitted for further management of this blood clot.  Patient denies any COVID-19 symptoms.  Denies any prior history of PE or DVT, no recent surgery, prolonged bedrest, active cancer.  Past Medical History:  Diagnosis Date  . Anxiety   . Back pain, chronic    low  . Blood transfusion without reported diagnosis   . Depression   . Dyslipidemia   . Gait disturbance   . Gynecomastia, male   . Hx of dizziness   . Hypothyroidism   . MS (multiple sclerosis) (HCC)    probable  . PONV (postoperative nausea and vomiting)    Nausea  . Snoring    "mild" sleep apnea. Pt was not required to wear CPAP machine, instead he uses nasal spray  . Testosterone deficiency   . Vision abnormalities     Patient Active Problem List   Diagnosis Date Noted  . Urinary  frequency 12/12/2018  . Insomnia 06/10/2018  . Carpal tunnel syndrome of left wrist 01/25/2017  . Low serum vitamin D 11/10/2015  . Depression with anxiety 04/20/2015  . Low testosterone 04/20/2015  . Paresthesia 12/09/2014  . Ulnar nerve entrapment 12/09/2014  . High risk medication use 12/09/2014  . Pronation deformity of both feet 12/07/2014  . Metatarsal deformity 12/07/2014  . Metatarsalgia of both feet 12/07/2014  . Bradycardia, sinus 09/30/2014  . Other fatigue 09/30/2014  . Shift work sleep disorder 08/25/2014  . Erectile dysfunction 08/25/2014  . Ataxic gait 08/25/2014  . Subareolar gynecomastia in male 01/19/2014  . Multiple sclerosis ([place]) 03/10/2013  . Abnormality of gait 03/10/2013    Past Surgical History:  Procedure Laterality Date  . BUNIONECTOMY Right   . GYNECOMASTIA EXCISION     X 2  . GYNECOMASTIA MASTECTOMY Bilateral 03/09/2014   Procedure: BILATERAL EXCISION GYNECOMASTIA;  Surgeon: Merrie Roof, MD;  Location: [facility];  Service: General;  Laterality: Bilateral;  . ULNAR NERVE REPAIR Left        Family History  Problem Relation Age of Onset  . Cancer Mother   . Cancer Father   . Hypertension Father   . Stroke Other        Grandmother  . Hypertension Brother     Social History   Tobacco Use  .  Smoking status: Former Research scientist (life sciences)  . Smokeless tobacco: Never Used  Substance Use Topics  . Alcohol use: Yes    Alcohol/week: 0.0 standard drinks    Comment: social  . Drug use: No    Home Medications Prior to Admission medications   Medication Sig Start Date End Date Taking? Authorizing Provider  amphetamine-dextroamphetamine (ADDERALL) 20 MG tablet Take 1 tablet by mouth 2 (two) times daily. 11/18/18   [provider]  Multiple Vitamin (MULTIVITAMIN WITH MINERALS) TABS tablet Take 1 tablet by mouth daily.    [provider]  naproxen (NAPROSYN) 500 MG tablet Take 1 tablet (500 mg total) by mouth 2 (two) times daily. 08/11/16   Sater,  Nanine Means, MD  ocrelizumab 600 mg in sodium chloride 0.9 % 500 mL Inject 600 mg into the vein every 6 (six) months.    [provider]  ondansetron (ZOFRAN) 8 MG tablet Take 0.5 tablets (4 mg total) by mouth daily as needed for nausea or vomiting. 10/11/17   Sater, Nanine Means, MD  oxybutynin (DITROPAN) 5 MG tablet Take 1 tablet (5 mg total) by mouth 2 (two) times daily. 06/12/19   Sater, Nanine Means, MD  sildenafil (VIAGRA) 100 MG tablet Take 1 tablet (100 mg total) by mouth daily as needed for erectile dysfunction. 12/12/18   Sater, Nanine Means, MD  simvastatin (ZOCOR) 20 MG tablet Take 20 mg by mouth daily.    [provider]  zolpidem (AMBIEN) 10 MG tablet Take 10 mg by mouth at bedtime as needed for sleep.    [provider]    Allergies    Sulfa antibiotics  Review of Systems   Review of Systems  All other systems reviewed and are negative.   Physical Exam Updated Vital Signs BP 120/78 (BP Location: Right Arm)   Pulse 94   Temp 98 F (36.7 C) (Oral)   Resp 15   Ht '5\' 6"'  (1.676 m)   Wt 80.7 kg   SpO2 96%   BMI 28.73 kg/m   Physical Exam Vitals and nursing note reviewed.  Constitutional:      General: He is not in acute distress.    Appearance: He is well-developed.  HENT:     Head: Atraumatic.  Eyes:     Conjunctiva/sclera: Conjunctivae normal.  Cardiovascular:     Rate and Rhythm: Normal rate and regular rhythm.     Pulses: Normal pulses.     Heart sounds: Normal heart sounds.  Pulmonary:     Effort: Pulmonary effort is normal.     Breath sounds: Normal breath sounds.  Abdominal:     Palpations: Abdomen is soft.     Tenderness: There is no abdominal tenderness.  Musculoskeletal:        General: Tenderness (Right lower extremity: Tenderness to posterior calf on palpation.  Leg compartment is soft.  Faint erythema noted to the anterior tib-fib.  No pitting edema, right thigh nontender and nonedematous.  Dorsalis pedis pulse palpable with brisk  cap refill.  ) present.     Cervical back: Neck supple.  Skin:    Findings: No rash.  Neurological:     Mental Status: He is alert.     ED Results / Procedures / Treatments   Labs (all labs ordered are listed, but only abnormal results are displayed) Labs Reviewed  BASIC METABOLIC PANEL - Abnormal; Notable for the following components:      Result Value   Glucose, Bld 117 (*)    All  other components within normal limits  CBC WITH DIFFERENTIAL/PLATELET - Abnormal; Notable for the following components:   Abs Immature Granulocytes 0.12 (*)    All other components within normal limits  SARS CORONAVIRUS 2 (TAT 6-24 HRS)    EKG None  Radiology US Venous Img Lower Unilateral Right  Result Date: 08/22/2019 CLINICAL DATA:  Pain and tenderness in the right calf for 5 days EXAM: RIGHT LOWER EXTREMITY VENOUS DOPPLER ULTRASOUND TECHNIQUE: Gray-scale sonography with compression, as well as color and duplex ultrasound, were performed to evaluate the deep venous system(s) from the level of the common femoral vein through the popliteal and proximal calf veins. COMPARISON:  None. FINDINGS: Positive for deep venous thrombosis with recent appearance (vessel expansion) seen at the femoral vein into the popliteal vein, posterior tibial veins, and peroneal veins. Lesser saphenous and gastrocnemius veins are also affected. Inferior to the level of the femoral vein, clot appears occlusive. Proximal to the clot respiratory phasicity is preserved. ASAP these results will be called to the ordering clinician or representative by the Radiologist Assistant, and communication documented in the PACS or zVision Dashboard. IMPRESSION: Positive for extensive DVT in the right lower extremity, seen from the femoral vein into multiple calf branches. Electronically Signed   By: Monte Fantasia M.D.   On: 08/22/2019 09:47    Procedures Procedures (including critical care time)  Medications Ordered in ED Medications    Rivaroxaban (XARELTO) tablet 15 mg (has no administration in time range)  rivaroxaban (XARELTO) Education Kit for DVT/PE patients (has no administration in time range)    ED Course  I have reviewed the triage vital signs and the nursing notes.  Pertinent labs & imaging results that were available during my care of the patient were reviewed by me and considered in my medical decision making (see chart for details).    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  Was seen at urgent care earlier this morning and had a positive DVT study of his right lower extremity.  DVT extending from femoral vein down to multiple calf branches.  Patient however is well-appearing, no chest pain or shortness of breath, exam of his leg is unremarkable, no findings to suggest phlegmasia cerulea dolens or phlegmasia cerumen alba.  Will check renal function and anticipate discharge home with oral DOAC.  Care discussed with Dr. Jeanell Sparrow.    Domenic Moras, PA-C 08/22/19 1347    Pattricia Boss, MD 08/23/19 [phone]

## 2019-08-22 NOTE — Discharge Instructions (Addendum)
Proceed immediately to hospital emergency department

## 2019-08-22 NOTE — ED Triage Notes (Signed)
Patient c/o right calf pain x 5 days. Patient went to an UC and was informed that he had a DVT right calf.

## 2019-08-22 NOTE — Discharge Instructions (Addendum)
Information on my medicine - XARELTO (rivaroxaban)  This medication education was reviewed with me or my healthcare representative as part of my discharge preparation. WHY WAS XARELTO PRESCRIBED FOR YOU? Xarelto was prescribed to treat blood clots that may have been found in the veins of your legs (deep vein thrombosis) or in your lungs (pulmonary embolism) and to reduce the risk of them occurring again.  What do you need to know about Xarelto? The starting dose is one 15 mg tablet taken TWICE daily with food for the FIRST 21 DAYS then on Saturday 2/20 or after all 15 mg doses have been taken  the dose is changed to one 20 mg tablet taken ONCE A DAY with your evening meal.  DO NOT stop taking Xarelto without talking to the health care provider who prescribed the medication.  Refill your prescription for 20 mg tablets before you run out.  After discharge, you should have regular check-up appointments with your healthcare provider that is prescribing your Xarelto.  In the future your dose may need to be changed if your kidney function changes by a significant amount.  What do you do if you miss a dose? If you are taking Xarelto TWICE DAILY and you miss a dose, take it as soon as you remember. You may take two 15 mg tablets (total 30 mg) at the same time then resume your regularly scheduled 15 mg twice daily the next day.  If you are taking Xarelto ONCE DAILY and you miss a dose, take it as soon as you remember on the same day then continue your regularly scheduled once daily regimen the next day. Do not take two doses of Xarelto at the same time.   Important Safety Information Xarelto is a blood thinner medicine that can cause bleeding. You should call your healthcare provider right away if you experience any of the following: ? Bleeding from an injury or your nose that does not stop. ? Unusual colored urine (red or dark brown) or unusual colored stools (red or black). ? Unusual  bruising for unknown reasons. ? A serious fall or if you hit your head (even if there is no bleeding).  Some medicines may interact with Xarelto and might increase your risk of bleeding while on Xarelto. To help avoid this, consult your healthcare provider or pharmacist prior to using any new prescription or non-prescription medications, including herbals, vitamins, non-steroidal anti-inflammatory drugs (NSAIDs) and supplements.  This website has more information on Xarelto: VisitDestination.com.br.

## 2019-09-02 ENCOUNTER — Encounter: Payer: Self-pay | Admitting: Neurology

## 2019-09-02 ENCOUNTER — Telehealth: Payer: Self-pay | Admitting: Neurology

## 2019-09-02 ENCOUNTER — Telehealth: Payer: Self-pay | Admitting: Hematology and Oncology

## 2019-09-02 NOTE — Progress Notes (Signed)
    09/02/2019  RE:    Jeremy [name]  DOB 05/16/1981  To whom it may concern:  Mr. Jeremy [name] is a 38-year-old man who was diagnosed with multiples sclerosis in 2010.    He is followed by me at the [facility] Center at [facility].  He currently is on Ocrevus as his disease modifying therapy for multiple sclerosis and is compliant and tolerates it well.   He has had no exacerbations for many years.  His neurologic examination has not shown any worsening.     He has no signs of relapses or progression.  His medications for MS are well-tolerated and do not cause any cognitive, motor or other adverse effect that would affect driving.  He has no difficulty with vision.  He has no difficulty with cognitive function.  There is no evidence of mood disorders.  Motor function and mobility are normal.  He does not have any difficulties with fatigue at baseline or related to heat or physical activity.  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  He will continue to see me every 6 months for neurologic evaluation.  Please let me know if I can provide any further information.   Sincerely,    Jerrye Seebeck A. Grisela Mesch, MD, PhD, FAAN Certified in Neurology, Clinical Neurophysiology, Sleep Medicine, Pain Medicine and Neuroimaging Director, [facility] at [facility]  [facility] [address], [address] [place], [place] [address] [phone] 

## 2019-09-02 NOTE — Progress Notes (Signed)
    09/02/2019  RE:    Jeremy [name]  DOB 23-Jul-1981  To whom it may concern:  Mr. Jeremy [name] is a 39 year old man who was diagnosed with multiples sclerosis in 2010.    He is followed by me at the [facility].  He currently is on Ocrevus as his disease modifying therapy for multiple sclerosis and is compliant and tolerates it well.   He has had no exacerbations for many years.  His neurologic examination has not shown any worsening.     He has no signs of relapses or progression.  His medications for MS are well-tolerated and do not cause any cognitive, motor or other adverse effect that would affect driving.  He has no difficulty with vision.  He has no difficulty with cognitive function.  There is no evidence of mood disorders.  Motor function and mobility are normal.  He does not have any difficulties with fatigue at baseline or related to heat or physical activity.  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  He will continue to see me every 6 months for neurologic evaluation.  Please let me know if I can provide any further information.   Sincerely,    Seva Chancy A. Epimenio Foot, MD, PhD, FAAN Certified in Neurology, Clinical Neurophysiology, Sleep Medicine, Pain Medicine and Neuroimaging Director, [facility] at [facility]  [facility] [address], [address] [place], [place] [address] [phone]

## 2019-09-02 NOTE — Progress Notes (Unsigned)
    September 02, 2019  RE:    Arian Murley  DOB 18-Mar-1981  To whom it may concern:  Mr. Treshun Wold is a 39 year old man who was diagnosed with multiples sclerosis in 2010.    He is followed by me at the [facility] at [facility].  He currently is on Ocrevus as his disease modifying therapy for multiple sclerosis and is compliant and tolerates it well.   He has had no exacerbations for many years.  His neurologic examination has not shown any worsening.     He has no signs of relapses or progression.  His medications for MS are well-tolerated and do not cause any cognitive, motor or other adverse effect that would affect driving.  He has no difficulty with vision.  He has no difficulty with cognitive function.  There is no evidence of mood disorders.  Motor function and mobility are normal.  He does not have any difficulties with fatigue at baseline or related to heat or physical activity.  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  He will continue to see me every 6 months for neurologic evaluation.  Please let me know if I can provide any further information.   Sincerely,    Amando Ishikawa A. Epimenio Foot, MD, PhD, FAAN Certified in Neurology, Clinical Neurophysiology, Sleep Medicine, Pain Medicine and Neuroimaging Director, [facility] at [facility]  [facility] [address] [place], [place] [address] [phone]

## 2019-09-02 NOTE — Telephone Encounter (Signed)
Received a new hem referral from Dr. Renne Crigler for Acute embolism and thrombosis of right femoral vein. Jeremy [name] has been cld and scheduled to see Dr. Leonides Schanz on 2/22 at 9am. P aware to arrive 15 minutes early.

## 2019-09-02 NOTE — Telephone Encounter (Signed)
I printed a letter and placed on ledge

## 2019-09-02 NOTE — Telephone Encounter (Signed)
Pt called stating that he is needing a [employer] Letter for his job. Please advise.

## 2019-09-03 NOTE — Telephone Encounter (Signed)
Called, LVM for pt letting him know letter ready for pick up. Provided office hours and advised we are closed fridays.

## 2019-09-14 NOTE — Progress Notes (Signed)
[facility] [phone]   [phone]  INITIAL CONSULT NOTE  [name] Care Team: Merri Brunette, MD as PCP - General (Internal Medicine)  Hematological/Oncological History # Right Lower Extremity Deep Vein Thrombosis 1) 08/22/2019: presented to [facility] ED with 5 days of right calf pain. [place] of Jeremy LE showed extensive DVT in Jeremy right lower extremity, seen from Jeremy femoral vein into multiple calf branches. Started on Xarelto 2) 09/15/2019: establish care with Dr. Leonides Schanz   CHIEF COMPLAINTS/PURPOSE OF CONSULTATION:  "Right Lower Extremity Deep Vein Thrombosis "  HISTORY OF PRESENTING ILLNESS:  Jeremy [name] 39 y.o. male with medical history significant for OSA, hypothyroidism, multiple sclerosis, and depression who presents for evaluation of an acute right lower extremity DVT.   On review of Jeremy previous records Jeremy [name] presented to [facility] emergency department on 08/22/2019 with 5 days of right calf pain.  In Jeremy emergency department he had an ultrasound of his lower extremity which showed extensive DVT in Jeremy right lower extremity seen from femoral vein into multiple calf branches.  At that time he was started on apixaban therapy.  Due to concern for this DVT Jeremy [name] was referred to hematology for further evaluation and management.  On exam today Jeremy [name] he notes that he feels well.  He reports that he initially had what he thought was a cramp in his right leg in January 2021.  Shortly after starting Jeremy Xarelto therapy he noted an improvement in Jeremy pain.  He does note that he is currently wearing compression stockings bilaterally and that he still does have some right upper thigh pain, but overall Jeremy symptoms are improved.  He is able to ambulate without difficulty and currently has no restrictions in his movement.  He has not been working since Jeremy time of Jeremy blood clot.  Of note he does endorse having a flulike illness 1 month ago.  He reports that he was  tested for Covid at Jeremy time but subsequently tested negative.  He reports he is tolerating Xarelto therapy well without any bleeding, bruising, or dark stools.  On further discussion he does have a history of multiple sclerosis which is being followed by neurology here at [facility].  He does endorse that his father has a clotting disorder of some kind but he is unsure what type.  He notes that this is Jeremy first and only clot that he is aware that he has had.  Also he reports that he may have a history of G6PD deficiency as he was diagnosed with this in birth but he has not been able to confirm this personally.  He does report that he did a 23 knee test was sure that he was 83% [place] with some component of African and [place] genetic composition.  Otherwise a full 10 point ROS is listed below.  MEDICAL HISTORY:  Past Medical History:  Diagnosis Date  . Anxiety   . Back pain, chronic    low  . Blood transfusion without reported diagnosis   . Depression   . Dyslipidemia   . Gait disturbance   . Gynecomastia, male   . Hx of dizziness   . Hypothyroidism   . MS (multiple sclerosis) (HCC)    probable  . PONV (postoperative nausea and vomiting)    Nausea  . Snoring    "mild" sleep apnea. Pt was not required to wear CPAP machine, instead he uses nasal spray  . Testosterone deficiency   . Vision abnormalities  SURGICAL HISTORY: Past Surgical History:  Procedure Laterality Date  . BUNIONECTOMY Right   . GYNECOMASTIA EXCISION     X 2  . GYNECOMASTIA MASTECTOMY Bilateral 03/09/2014   Procedure: BILATERAL EXCISION GYNECOMASTIA;  Surgeon: Merrie Roof, MD;  Location: [facility];  Service: General;  Laterality: Bilateral;  . ULNAR NERVE REPAIR Left     SOCIAL HISTORY: Social History   Socioeconomic History  . Marital status: Single    Spouse name: Not on file  . Number of children: 0  . Years of education: College   . Highest education level: Not on file  Occupational History  .  Occupation: Chef   Tobacco Use  . Smoking status: Former Research scientist (life sciences)  . Smokeless tobacco: Never Used  Substance and Sexual Activity  . Alcohol use: Yes    Alcohol/week: 0.0 standard drinks    Comment: social  . Drug use: No  . Sexual activity: Not on file  Other Topics Concern  . 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    [name] has a 4 year college education.    [name] has no children.          Social Determinants of Health   Financial Resource Strain:   . Difficulty of Paying Living Expenses: Not on file  Food Insecurity:   . Worried About Charity fundraiser in [place] Last Year: Not on file  . Ran Out of Food in [place] Last Year: Not on file  Transportation Needs:   . Lack of Transportation (Medical): Not on file  . Lack of Transportation (Non-Medical): Not on file  Physical Activity:   . Days of Exercise per Week: Not on file  . Minutes of Exercise per Session: Not on file  Stress:   . Feeling of Stress : Not on file  Social Connections:   . Frequency of Communication with Friends and Family: Not on file  . Frequency of Social Gatherings with Friends and Family: Not on file  . Attends Religious Services: Not on file  . Active Member of Clubs or Organizations: Not on file  . Attends Archivist Meetings: Not on file  . Marital Status: Not on file  Intimate Partner Violence:   . Fear of Current or Ex-Partner: Not on file  . Emotionally Abused: Not on file  . Physically Abused: Not on file  . Sexually Abused: Not on file    FAMILY HISTORY: Family History  Problem Relation Age of Onset  . Cancer Mother   . Cancer Father   . Hypertension Father   . Stroke Other        Grandmother  . Hypertension Brother     ALLERGIES:  is allergic to sulfa antibiotics.  MEDICATIONS:  Current Outpatient Medications  Medication Sig Dispense Refill  . amphetamine-dextroamphetamine (ADDERALL) 20 MG tablet Take 1 tablet by mouth 2 (two)  times daily.    . Multiple Vitamin (MULTIVITAMIN WITH MINERALS) TABS tablet Take 1 tablet by mouth daily.    Marland Kitchen ocrelizumab 600 mg in sodium chloride 0.9 % 500 mL Inject 600 mg into Jeremy vein every 6 (six) months.    . ondansetron (ZOFRAN) 8 MG tablet Take 0.5 tablets (4 mg total) by mouth daily as needed for nausea or vomiting. 30 tablet 5  . Rivaroxaban 15 & 20 MG TBPK Follow package directions: Take one 15mg  tablet by mouth twice a day. On day 22, switch to one  20mg  tablet once a day. Take with food. 51 each 0  . sildenafil (VIAGRA) 100 MG tablet Take 1 tablet (100 mg total) by mouth daily as needed for erectile dysfunction. 30 tablet 3  . simvastatin (ZOCOR) 20 MG tablet Take 20 mg by mouth daily.    zolpidem (AMBIEN) 10 MG tablet Take 10 mg by mouth at bedtime as needed for sleep.     No current facility-administered medications for this visit.   Facility-Administered Medications Ordered in Other Visits  Medication Dose Route Frequency Provider Last Rate Last Admin  . gadopentetate dimeglumine (MAGNEVIST) injection 15 mL  15 mL Intravenous Once PRN Sater, Richard A, MD      . gadopentetate dimeglumine (MAGNEVIST) injection 16 mL  16 mL Intravenous Once PRN Sater, Marland Kitchen, MD        REVIEW OF SYSTEMS:   Constitutional: ( - ) fevers, ( - )  chills , ( - ) night sweats Eyes: ( - ) blurriness of vision, ( - ) double vision, ( - ) watery eyes Ears, nose, mouth, throat, and face: ( - ) mucositis, ( - ) sore throat Respiratory: ( - ) cough, ( - ) dyspnea, ( - ) wheezes Cardiovascular: ( - ) palpitation, ( - ) chest discomfort, ( - ) lower extremity swelling Gastrointestinal:  ( - ) nausea, ( - ) heartburn, ( - ) change in bowel habits Skin: ( - ) abnormal skin rashes Lymphatics: ( - ) new lymphadenopathy, ( - ) easy bruising Neurological: ( - ) numbness, ( - ) tingling, ( - ) new weaknesses Behavioral/Psych: ( - ) mood change, ( - ) new changes  All other systems were reviewed with Jeremy  [name] and are negative.  PHYSICAL EXAMINATION: ECOG PERFORMANCE STATUS: 1 - Symptomatic but completely ambulatory  Vitals:   09/15/19 0913  BP: 118/78  Pulse: 76  Resp: 17  Temp: 98.4 F (36.9 C)  SpO2: 98%   Filed Weights   09/15/19 0913  Weight: 180 lb 11.2 oz (82 kg)    GENERAL: well appearing young Caucasian male in NAD  SKIN: skin color, texture, turgor are normal, no rashes or significant lesions EYES: conjunctiva are pink and non-injected, sclera clear LUNGS: clear to auscultation and percussion with normal breathing effort HEART: regular rate & rhythm and no murmurs and no lower extremity edema Musculoskeletal: no cyanosis of digits and no clubbing  PSYCH: alert & oriented x 3, fluent speech NEURO: no focal motor/sensory deficits  LABORATORY DATA:  I have reviewed Jeremy data as listed CBC Latest Ref Rng & Units 09/15/2019 08/22/2019 06/12/2019  WBC 4.0 - 10.5 K/uL 5.1 8.3 6.1  Hemoglobin 13.0 - 17.0 g/dL 06/14/2019 16.1 09.6  Hematocrit 39.0 - 52.0 % 45.1 41.9 46.2  Platelets 150 - 400 K/uL 172 219 188    CMP Latest Ref Rng & Units 09/15/2019 08/22/2019 06/12/2019  Glucose 70 - 99 mg/dL 06/14/2019) 409(W) 91  BUN 6 - 20 mg/dL 14 15 15   Creatinine 0.61 - 1.24 mg/dL 119(J 4.78  Sodium 135 - 145 mmol/L 139 140 142  Potassium 3.5 - 5.1 mmol/L 4.4 4.1 4.6  Chloride 98 - 111 mmol/L 105 106 105  CO2 22 - 32 mmol/L 26 26 21   Calcium 8.9 - 10.3 mg/dL 9.5 9.1 9.5  Total Protein 6.5 - 8.1 g/dL 7.2 - 7.0  Total Bilirubin 0.3 - 1.2 mg/dL 0.6 - 1.0  Alkaline Phos 38 - 126 U/L 79 - 96  AST  15 - 41 U/L 94(H) - 24  ALT 0 - 44 U/L 358(HH) - 58(H)     PATHOLOGY: None relevant to review.   RADIOGRAPHIC STUDIES:  US Venous Img Lower Unilateral Right  Result Date: 08/22/2019 CLINICAL DATA:  Pain and tenderness in Jeremy right calf for 5 days EXAM: RIGHT LOWER EXTREMITY VENOUS DOPPLER ULTRASOUND TECHNIQUE: Gray-scale sonography with compression, as well as color and duplex ultrasound,  were performed to evaluate Jeremy deep venous system(s) from Jeremy level of Jeremy common femoral vein through Jeremy popliteal and proximal calf veins. COMPARISON:  None. FINDINGS: Positive for deep venous thrombosis with recent appearance (vessel expansion) seen at Jeremy femoral vein into Jeremy popliteal vein, posterior tibial veins, and peroneal veins. Lesser saphenous and gastrocnemius veins are also affected. Inferior to Jeremy level of Jeremy femoral vein, clot appears occlusive. Proximal to Jeremy clot respiratory phasicity is preserved. ASAP these results will be called to Jeremy ordering clinician or representative by Jeremy Radiologist Assistant, and communication documented in Jeremy PACS or zVision Dashboard. IMPRESSION: Positive for extensive DVT in Jeremy right lower extremity, seen from Jeremy femoral vein into multiple calf branches. Electronically Signed   By: Marnee Spring M.D.   On: 08/22/2019 09:47    ASSESSMENT & PLAN Jeremy [name] 39 y.o. male with medical history significant for OSA, hypothyroidism, multiple sclerosis, and depression who presents for evaluation of an acute right lower extremity DVT.  After review of Jeremy records and discussion with Jeremy [name] Jeremy findings are most consistent with a provoked DVT.  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  As such I would recommend anticoagulation therapy for as long as this is his occupation. We can consider decreasing Jeremy dose to maintenance Xarelto after completing 6 months of full strength therapy.  Additional complicating factor is Jeremy elevation of Jeremy [name]'s LFTs.  Although Xarelto can cause a mild bump in LFTs this appears at upper portion to what would be expected.  I would recommend hepatology evaluation for consideration of alternative causes.  If no etiology can be discerned I would recommend that we transition Jeremy [name] from  Xarelto to apixaban (Jeremy LFT elevation is not known to be a class effect and therefore transitioning to another DOAC would be reasonable).  #Right Lower Extremity Deep Vein Thrombosis --continue Xarelto 20mg  for at least 6 months of total therapy --no evidence of PE or clot burden elsewhere --in order to assure no alternative underlying conditions will order anticardiolipin and anti-beta 2 glycoprotein. An lupus anticoagulant panel is of little utility while on systemic treatment for DVT.  --given Jeremy family history of clotting we will also check FVL and Prothrombin gene mutation.  --if [name] has no evidence of APS we can continue Xarelto therapy --given that his driving is not a modifiable risk factor we can continue Xarelto indefinitely at a lower dose  --RTC in June 2021 to discuss reduced dose vs. discontinuation of treatment.  #Elevated LFTs, acute  --known to have mild elevations of LFTs in Jeremy past, but there has been a marked increase since his last check in Oct 2020 --possible that Xarelto is causing this (can cause LFTs elevations) though normally not this severe. If no alternative etiology can be discerned I would recommend transitioning to apixaban therapy.  --pattern is not consistent with an Alcoholic hepatitis (ALT/AST ratio is off). Additionally [name] notes he does not consume  EtOH.  --referral to [facility] for hepatology workup.   #Reported History of G6PD --will check G6PD levels today --avoid oxidating agents such as fava beans in Jeremy interim --request that he notify providers prior to receiving medications if found to have Jeremy disorder.    Orders Placed This Encounter  Procedures  . CBC with Differential (Cancer Center Only)    Standing Status:   Future    Number of Occurrences:   1    Standing Expiration Date:   09/14/2020  . CMP (Cancer Center only)    Standing Status:   Future    Number of Occurrences:   1    Standing Expiration Date:   09/14/2020  . G-6-P-D      Standing Status:   Future    Number of Occurrences:   1    Standing Expiration Date:   09/14/2020  . Beta-2-glycoprotein i abs, IgG/M/A    Standing Status:   Future    Number of Occurrences:   1    Standing Expiration Date:   09/14/2020  . Cardiolipin antibodies, IgG, IgM, IgA*    Standing Status:   Future    Number of Occurrences:   1    Standing Expiration Date:   09/14/2020  . Factor 5 Leiden*    Standing Status:   Future    Number of Occurrences:   1    Standing Expiration Date:   09/14/2020  . Prothrombin gene mutation*    Standing Status:   Future    Number of Occurrences:   1    Standing Expiration Date:   09/14/2020  . Ambulatory referral to Gastroenterology    Referral Priority:   Urgent    Referral Type:   Consultation    Referral Reason:   Specialty Services Required    Number of Visits Requested:   1    All questions were answered. Jeremy [name] knows to call [facility] clinic with any problems, questions or concerns.  A total of more than 60 minutes were spent on this encounter and over half of that time was spent on counseling and coordination of care as outlined above.   Ulysees Barns, MD Department of Hematology/Oncology [facility] Phone: [phone] Pager: [phone] Email: [email or web address]  09/16/2019 3:47 PM

## 2019-09-15 ENCOUNTER — Telehealth: Payer: Self-pay | Admitting: *Deleted

## 2019-09-15 ENCOUNTER — Other Ambulatory Visit: Payer: Self-pay

## 2019-09-15 ENCOUNTER — Inpatient Hospital Stay (HOSPITAL_BASED_OUTPATIENT_CLINIC_OR_DEPARTMENT_OTHER): Payer: 59 | Admitting: Hematology and Oncology

## 2019-09-15 ENCOUNTER — Inpatient Hospital Stay: Payer: 59 | Attending: Hematology and Oncology

## 2019-09-15 VITALS — BP 118/78 | HR 76 | Temp 98.4°F | Resp 17 | Ht 66.0 in | Wt 180.7 lb

## 2019-09-15 DIAGNOSIS — E039 Hypothyroidism, unspecified: Secondary | ICD-10-CM | POA: Diagnosis not present

## 2019-09-15 DIAGNOSIS — I82461 Acute embolism and thrombosis of right calf muscular vein: Secondary | ICD-10-CM | POA: Diagnosis not present

## 2019-09-15 DIAGNOSIS — I82411 Acute embolism and thrombosis of right femoral vein: Secondary | ICD-10-CM | POA: Diagnosis present

## 2019-09-15 DIAGNOSIS — Z79899 Other long term (current) drug therapy: Secondary | ICD-10-CM | POA: Diagnosis not present

## 2019-09-15 DIAGNOSIS — G35 Multiple sclerosis: Secondary | ICD-10-CM | POA: Insufficient documentation

## 2019-09-15 DIAGNOSIS — G4733 Obstructive sleep apnea (adult) (pediatric): Secondary | ICD-10-CM | POA: Insufficient documentation

## 2019-09-15 DIAGNOSIS — Z7901 Long term (current) use of anticoagulants: Secondary | ICD-10-CM | POA: Insufficient documentation

## 2019-09-15 DIAGNOSIS — R7989 Other specified abnormal findings of blood chemistry: Secondary | ICD-10-CM

## 2019-09-15 LAB — CBC WITH DIFFERENTIAL (CANCER CENTER ONLY)
Abs Immature Granulocytes: 0.02 10*3/uL (ref 0.00–0.07)
Basophils Absolute: 0 10*3/uL (ref 0.0–0.1)
Basophils Relative: 0 %
Eosinophils Absolute: 0.1 10*3/uL (ref 0.0–0.5)
Eosinophils Relative: 2 %
HCT: 45.1 % (ref 39.0–52.0)
Hemoglobin: 14.3 g/dL (ref 13.0–17.0)
Immature Granulocytes: 0 %
Lymphocytes Relative: 16 %
Lymphs Abs: 0.8 10*3/uL (ref 0.7–4.0)
MCH: 29.8 pg (ref 26.0–34.0)
MCHC: 31.7 g/dL (ref 30.0–36.0)
MCV: 94 fL (ref 80.0–100.0)
Monocytes Absolute: 0.4 10*3/uL (ref 0.1–1.0)
Monocytes Relative: 8 %
Neutro Abs: 3.7 10*3/uL (ref 1.7–7.7)
Neutrophils Relative %: 74 %
Platelet Count: 172 10*3/uL (ref 150–400)
RBC: 4.8 MIL/uL (ref 4.22–5.81)
RDW: 13.1 % (ref 11.5–15.5)
WBC Count: 5.1 10*3/uL (ref 4.0–10.5)
nRBC: 0 % (ref 0.0–0.2)

## 2019-09-15 LAB — CMP (CANCER CENTER ONLY)
ALT: 358 U/L (ref 0–44)
AST: 94 U/L — ABNORMAL HIGH (ref 15–41)
Albumin: 4.4 g/dL (ref 3.5–5.0)
Alkaline Phosphatase: 79 U/L (ref 38–126)
Anion gap: 8 (ref 5–15)
BUN: 14 mg/dL (ref 6–20)
CO2: 26 mmol/L (ref 22–32)
Calcium: 9.5 mg/dL (ref 8.9–10.3)
Chloride: 105 mmol/L (ref 98–111)
Creatinine: 0.82 mg/dL (ref 0.61–1.24)
GFR, Est AFR Am: >60 mL/min (ref >60)
GFR, Estimated: >60 mL/min (ref >60)
Glucose, Bld: 103 mg/dL — ABNORMAL HIGH (ref 70–99)
Potassium: 4.4 mmol/L (ref 3.5–5.1)
Sodium: 139 mmol/L (ref 135–145)
Total Bilirubin: 0.6 mg/dL (ref 0.3–1.2)
Total Protein: 7.2 g/dL (ref 6.5–8.1)

## 2019-09-15 NOTE — Telephone Encounter (Signed)
Received call from lab with critical results of ALT of 358. AST is elevated 2 94. Dr. Leonides Schanz made aware.

## 2019-09-16 ENCOUNTER — Encounter: Payer: Self-pay | Admitting: Hematology and Oncology

## 2019-09-16 LAB — GLUCOSE 6 PHOSPHATE DEHYDROGENASE
G6PDH: <0.3 U/g{Hb} — ABNORMAL LOW (ref 3.8–14.2)
Hemoglobin: 14.7 g/dL (ref 13.0–17.7)

## 2019-09-16 LAB — BETA-2-GLYCOPROTEIN I ABS, IGG/M/A
Beta-2 Glyco I IgG: <9 GPI IgG units (ref 0–20)
Beta-2-Glycoprotein I IgA: <9 GPI IgA units (ref 0–25)
Beta-2-Glycoprotein I IgM: <9 GPI IgM units (ref 0–32)

## 2019-09-16 LAB — CARDIOLIPIN ANTIBODIES, IGG, IGM, IGA
Anticardiolipin IgA: <9 APL U/mL (ref 0–11)
Anticardiolipin IgG: <9 GPL U/mL (ref 0–14)
Anticardiolipin IgM: <9 MPL U/mL (ref 0–12)

## 2019-09-17 ENCOUNTER — Encounter: Payer: Self-pay | Admitting: Hematology and Oncology

## 2019-09-17 ENCOUNTER — Telehealth: Payer: Self-pay | Admitting: Hematology and Oncology

## 2019-09-17 NOTE — Telephone Encounter (Signed)
Rescheduled per 2/24 sch msg, pt req. Called and spoke with pt, confirmed 6/25 appt

## 2019-09-18 ENCOUNTER — Ambulatory Visit: Payer: 59 | Admitting: Internal Medicine

## 2019-09-18 ENCOUNTER — Ambulatory Visit: Payer: 59 | Admitting: Nurse Practitioner

## 2019-09-18 ENCOUNTER — Other Ambulatory Visit: Payer: Self-pay

## 2019-09-18 ENCOUNTER — Other Ambulatory Visit (INDEPENDENT_AMBULATORY_CARE_PROVIDER_SITE_OTHER): Payer: 59

## 2019-09-18 ENCOUNTER — Encounter: Payer: Self-pay | Admitting: Internal Medicine

## 2019-09-18 VITALS — BP 100/70 | HR 76 | Temp 98.4°F | Ht 65.25 in | Wt 179.1 lb

## 2019-09-18 DIAGNOSIS — R7989 Other specified abnormal findings of blood chemistry: Secondary | ICD-10-CM

## 2019-09-18 LAB — IBC PANEL
Iron: 150 ug/dL (ref 42–165)
Saturation Ratios: 42.7 % (ref 20.0–50.0)
Transferrin: 251 mg/dL (ref 212.0–360.0)

## 2019-09-18 LAB — HEPATIC FUNCTION PANEL
ALT: 516 U/L — ABNORMAL HIGH (ref 0–53)
AST: 139 U/L — ABNORMAL HIGH (ref 0–37)
Albumin: 4.5 g/dL (ref 3.5–5.2)
Alkaline Phosphatase: 79 U/L (ref 39–117)
Bilirubin, Direct: 0.2 mg/dL (ref 0.0–0.3)
Total Bilirubin: 0.9 mg/dL (ref 0.2–1.2)
Total Protein: 7.2 g/dL (ref 6.0–8.3)

## 2019-09-18 LAB — PROTIME-INR
INR: 1.3 ratio — ABNORMAL HIGH (ref 0.8–1.0)
Prothrombin Time: 14.9 s — ABNORMAL HIGH (ref 9.6–13.1)

## 2019-09-18 LAB — IRON: Iron: 150 ug/dL (ref 42–165)

## 2019-09-18 LAB — FERRITIN: Ferritin: 555.2 ng/mL — ABNORMAL HIGH (ref 22.0–322.0)

## 2019-09-18 NOTE — Progress Notes (Addendum)
HISTORY OF PRESENT ILLNESS:  Jeremy [name] is a 39 y.o. male, [place] negative, with a history of multiple sclerosis, recent DVT with negative hematology work-up now on rivaroxaban, who is sent today regarding elevated liver test.  Patient tells me that he is known to have elevated liver tests dating back at least 10 years.  He has known G6PD deficiency.  No personal or family history of liver disease.  He does not smoke or use alcohol.  Reviewing the patient's electronic medical record liver test values over the past 5 years are as follows: 2016 normal liver test.  2017 ALT 47; ALT 101; 2018 normal; 2020 ALT 58 and hemoglobin 15.8; January 2021 hemoglobin 13.4; February 2021 ALT 358, AST 94, hemoglobin 14.3.  All other liver tests have been normal.  Proteins and globulins have been normal.  Medications have been reviewed.  He has been taking a probiotic but no additional nonprescription medications.  Venous ultrasound of the lower extremities August 22, 2019 revealed right lower extremity DVT.  Previous CT scan December 2020 of the abdomen pelvis (to evaluate wounds of being a right-sided small lipoma) revealed no significant abnormalities.  Incidental nephrolithiasis and hepatic steatosis type changes were present.  Patient's GI review of systems is negative.  He apparently had prior colonoscopy with Dr. Levi Aland (records are being requested).  REVIEW OF SYSTEMS:  All non-GI ROS negative unless otherwise stated in the HPI except for sinus and allergy trouble, fatigue, sleeping problems, excessive thirst  Past Medical History:  Diagnosis Date  . Anemia    G6PD  . Anxiety   . Back pain, chronic    low  . Blood transfusion without reported diagnosis   . Depression   . DVT (deep venous thrombosis) ([place])   . Dyslipidemia   . Gait disturbance   . Gynecomastia, male   . Hx of dizziness   . Hypothyroidism   . MS (multiple sclerosis) (HCC)    probable  . PONV (postoperative nausea  and vomiting)    Nausea  . Snoring    "mild" sleep apnea. Pt was not required to wear CPAP machine, instead he uses nasal spray  . Testosterone deficiency   . Vision abnormalities     Past Surgical History:  Procedure Laterality Date  . BUNIONECTOMY Right   . GYNECOMASTIA EXCISION     X 2  . GYNECOMASTIA MASTECTOMY Bilateral 03/09/2014   Procedure: BILATERAL EXCISION GYNECOMASTIA;  Surgeon: Merrie Roof, MD;  Location: [place];  Service: General;  Laterality: Bilateral;  . ULNAR NERVE REPAIR Left     Social History Dodd Schmid  reports that he has quit smoking. His smoking use included cigarettes. He has never used smokeless tobacco. He reports current alcohol use. He reports that he does not use drugs.  family history includes Bladder Cancer in his paternal grandfather; Breast cancer in his mother; Clotting disorder in his father; Hyperlipidemia in his father; Hypertension in his brother, brother, and father; Prostate cancer in his father; Stroke in his paternal grandfather.  Allergies  Allergen Reactions  . Sulfa Antibiotics Nausea Only       PHYSICAL EXAMINATION: Vital signs: BP 100/70 (BP Location: Left Arm, Patient Position: Sitting, Cuff Size: Normal)   Pulse 76   Temp 98.4 F (36.9 C)   Ht 5' 5.25" (1.657 m) Comment: height measured without shoes  Wt 179 lb 2 oz (81.3 kg)   BMI 29.58 kg/m   Constitutional: generally well-appearing, no acute distress  Psychiatric: alert and oriented x3, cooperative Eyes: extraocular movements intact, anicteric, conjunctiva pink Mouth: oral pharynx moist, no lesions Neck: supple no lymphadenopathy Cardiovascular: heart regular rate and rhythm, no murmur Lungs: clear to auscultation bilaterally Abdomen: soft, nontender, nondistended, no obvious ascites, no peritoneal signs, normal bowel sounds, no organomegaly Rectal: Omitted Extremities: no clubbing, cyanosis, or lower extremity edema bilaterally Skin: no lesions on visible  extremities Neuro: No focal deficits. No asterixis.    ASSESSMENT:  1.  Abnormal hepatic transaminases, namely ALT.  Intermittent.  Rule out primary hepatic disorder.  Rule out secondary to other entity such as muscle. 2.  Fatty liver on imaging.  Nonspecific 3.  Multiple sclerosis.  Under control 4.  Recent DVT on rivaroxaban 5.  G6PD deficiency  PLAN:  1.  Obtain multiple viral nonviral studies 2.  Repeat liver test 3.  Office follow-up to review the above 4.  Consider CPK, aldolase, and haptoglobin 45 minutes spent preparing to see the patient, reviewing outside laboratory tests and x-rays, obtaining comprehensive history, performing comprehensive medical physical examination, counseling the patient regarding his above listed issues, ordering advanced laboratories and serologies, arranging follow-up, and documenting clinical information in the health record   ADDENDUM November 05, 2019: Colonoscopy performed March 28, 2016 by Dr. Evette Cristal was NORMAL.

## 2019-09-18 NOTE — Patient Instructions (Addendum)
Your provider has requested that you go to the basement level for lab work before leaving today. Press "B" on the elevator. The lab is located at the first door on the left as you exit the elevator.  Please follow up with Dr. Marina Goodell on _____4/22/2021 at 2:40_________________

## 2019-09-19 LAB — FACTOR 5 LEIDEN

## 2019-09-22 ENCOUNTER — Other Ambulatory Visit: Payer: Self-pay

## 2019-09-22 ENCOUNTER — Telehealth: Payer: Self-pay | Admitting: Internal Medicine

## 2019-09-22 DIAGNOSIS — R7989 Other specified abnormal findings of blood chemistry: Secondary | ICD-10-CM

## 2019-09-22 LAB — PROTHROMBIN GENE MUTATION

## 2019-09-22 NOTE — Telephone Encounter (Signed)
Labs ok so far. I sent note earlier on supplemental labs. Thanks

## 2019-09-22 NOTE — Telephone Encounter (Signed)
Pt calling for lab results, please advise. 

## 2019-09-22 NOTE — Telephone Encounter (Signed)
Pt aware.

## 2019-09-23 ENCOUNTER — Telehealth: Payer: Self-pay | Admitting: Neurology

## 2019-09-23 ENCOUNTER — Encounter: Payer: Self-pay | Admitting: Neurology

## 2019-09-23 ENCOUNTER — Other Ambulatory Visit (INDEPENDENT_AMBULATORY_CARE_PROVIDER_SITE_OTHER): Payer: 59

## 2019-09-23 ENCOUNTER — Encounter: Payer: Self-pay | Admitting: Hematology and Oncology

## 2019-09-23 DIAGNOSIS — R7989 Other specified abnormal findings of blood chemistry: Secondary | ICD-10-CM

## 2019-09-23 LAB — HEPATIC FUNCTION PANEL
ALT: 391 U/L — ABNORMAL HIGH (ref 0–53)
AST: 81 U/L — ABNORMAL HIGH (ref 0–37)
Albumin: 4.6 g/dL (ref 3.5–5.2)
Alkaline Phosphatase: 76 U/L (ref 39–117)
Bilirubin, Direct: 0.1 mg/dL (ref 0.0–0.3)
Total Bilirubin: 0.6 mg/dL (ref 0.2–1.2)
Total Protein: 7.3 g/dL (ref 6.0–8.3)

## 2019-09-23 LAB — CK: Total CK: 183 U/L (ref 7–232)

## 2019-09-23 NOTE — Telephone Encounter (Signed)
I printed and signed the letter.  I was unable to create the letter in epic so needed to use a word processor

## 2019-09-23 NOTE — Telephone Encounter (Signed)
Pt called stating that he is getting a DOT Physical and is needing a letter stating that he has passed a Neuro Psychological Evaluation. Please advise.

## 2019-09-24 ENCOUNTER — Encounter: Payer: Self-pay | Admitting: *Deleted

## 2019-09-24 LAB — ALPHA-1-ANTITRYPSIN: A-1 Antitrypsin, Ser: 154 mg/dL (ref 83–199)

## 2019-09-24 LAB — TISSUE TRANSGLUTAMINASE, IGA: (tTG) Ab, IgA: 1 U/mL

## 2019-09-24 LAB — MITOCHONDRIAL ANTIBODIES: Mitochondrial M2 Ab, IgG: <=20 U

## 2019-09-24 LAB — HEPATITIS B SURFACE ANTIBODY,QUALITATIVE: Hep B S Ab: NONREACTIVE

## 2019-09-24 LAB — HEPATITIS C ANTIBODY
Hepatitis C Ab: NONREACTIVE
SIGNAL TO CUT-OFF: 0.01 (ref <1.00)

## 2019-09-24 LAB — ANTI-SMOOTH MUSCLE ANTIBODY, IGG: Actin (Smooth Muscle) Antibody (IGG): <20 U (ref <20)

## 2019-09-24 LAB — HEPATITIS B SURFACE ANTIGEN: Hepatitis B Surface Ag: NONREACTIVE

## 2019-09-24 LAB — ANA: Anti Nuclear Antibody (ANA): NEGATIVE

## 2019-09-24 LAB — CERULOPLASMIN: Ceruloplasmin: 27 mg/dL (ref 18–36)

## 2019-09-24 LAB — HAPTOGLOBIN: Haptoglobin: 14 mg/dL — ABNORMAL LOW (ref 43–212)

## 2019-09-24 NOTE — Telephone Encounter (Signed)
Called pt. He will pick up letter. I placed up front for pick up.  He may require Neuropsychology exam and will call back if referral needed for this.

## 2019-09-25 ENCOUNTER — Other Ambulatory Visit: Payer: Self-pay

## 2019-09-25 DIAGNOSIS — R7989 Other specified abnormal findings of blood chemistry: Secondary | ICD-10-CM

## 2019-09-26 ENCOUNTER — Other Ambulatory Visit (INDEPENDENT_AMBULATORY_CARE_PROVIDER_SITE_OTHER): Payer: 59

## 2019-09-26 DIAGNOSIS — R7989 Other specified abnormal findings of blood chemistry: Secondary | ICD-10-CM | POA: Diagnosis not present

## 2019-09-26 LAB — HEPATIC FUNCTION PANEL
ALT: 407 U/L — ABNORMAL HIGH (ref 0–53)
AST: 116 U/L — ABNORMAL HIGH (ref 0–37)
Albumin: 4.6 g/dL (ref 3.5–5.2)
Alkaline Phosphatase: 72 U/L (ref 39–117)
Bilirubin, Direct: 0.2 mg/dL (ref 0.0–0.3)
Total Bilirubin: 0.9 mg/dL (ref 0.2–1.2)
Total Protein: 7.3 g/dL (ref 6.0–8.3)

## 2019-09-30 ENCOUNTER — Encounter: Payer: Self-pay | Admitting: Hematology and Oncology

## 2019-10-01 ENCOUNTER — Encounter: Payer: Self-pay | Admitting: *Deleted

## 2019-10-02 ENCOUNTER — Other Ambulatory Visit: Payer: 59

## 2019-10-02 DIAGNOSIS — R7989 Other specified abnormal findings of blood chemistry: Secondary | ICD-10-CM

## 2019-10-06 ENCOUNTER — Other Ambulatory Visit: Payer: 59

## 2019-10-06 DIAGNOSIS — R7989 Other specified abnormal findings of blood chemistry: Secondary | ICD-10-CM

## 2019-10-16 LAB — HEMOCHROMATOSIS DNA-PCR(C282Y,H63D)

## 2019-10-28 ENCOUNTER — Emergency Department (INDEPENDENT_AMBULATORY_CARE_PROVIDER_SITE_OTHER): Payer: 59

## 2019-10-28 ENCOUNTER — Emergency Department
Admission: EM | Admit: 2019-10-28 | Discharge: 2019-10-28 | Disposition: A | Discharge status: Home / Self Care | Payer: 59 | Source: Home / Self Care | Attending: Family Medicine | Admitting: Family Medicine

## 2019-10-28 ENCOUNTER — Other Ambulatory Visit: Payer: Self-pay

## 2019-10-28 DIAGNOSIS — S9032XA Contusion of left foot, initial encounter: Secondary | ICD-10-CM | POA: Diagnosis not present

## 2019-10-28 NOTE — Discharge Instructions (Addendum)
Apply ice pack for 10 to 15 minutes, 2 to 3 times daily  Continue until pain and swelling decrease.  May take Ibuprofen 200mg , 4 tabs every 8 hours with food.

## 2019-10-28 NOTE — ED Provider Notes (Signed)
Jeremy [name] C[facility]    CSN: [id number] Arrival date & time: 10/28/19  0902      History   Chief Complaint Chief Complaint  Patient presents with  . Foot Pain    HPI Jeremy [name] is a 38 y.o. male.   Patient dropped a gun safe on the distal aspect of his left foot last night.  He has had mild pain in the first MTP joint.  The history is provided by the patient.  Foot Pain This is a new problem. The current episode started yesterday. The problem occurs constantly. The problem has been rapidly improving. The symptoms are aggravated by walking. Nothing relieves the symptoms. He has tried nothing for the symptoms.    Past Medical History:  Diagnosis Date  . Anemia    G6PD  . Anxiety   . Back pain, chronic    low  . Blood transfusion without reported diagnosis   . Depression   . DVT (deep venous thrombosis) (HCC)   . Dyslipidemia   . Gait disturbance   . Gynecomastia, male   . Hx of dizziness   . Hypothyroidism   . MS (multiple sclerosis) (HCC)    probable  . PONV (postoperative nausea and vomiting)    Nausea  . Snoring    "mild" sleep apnea. Pt was not required to wear CPAP machine, instead he uses nasal spray  . Testosterone deficiency   . Vision abnormalities     Patient Active Problem List   Diagnosis Date Noted  . Urinary frequency 12/12/2018  . Insomnia 06/10/2018  . Carpal tunnel syndrome of left wrist 01/25/2017  . Low serum vitamin D 11/10/2015  . Depression with anxiety 04/20/2015  . Low testosterone 04/20/2015  . Paresthesia 12/09/2014  . Ulnar nerve entrapment 12/09/2014  . High risk medication use 12/09/2014  . Pronation deformity of both feet 12/07/2014  . Metatarsal deformity 12/07/2014  . Metatarsalgia of both feet 12/07/2014  . Bradycardia, sinus 09/30/2014  . Other fatigue 09/30/2014  . Shift work sleep disorder 08/25/2014  . Erectile dysfunction 08/25/2014  . Ataxic gait 08/25/2014  . Subareolar gynecomastia in male 01/19/2014  .  Multiple sclerosis (HCC) 03/10/2013  . Abnormality of gait 03/10/2013    Past Surgical History:  Procedure Laterality Date  . BUNIONECTOMY Right   . GYNECOMASTIA EXCISION     X 2  . GYNECOMASTIA MASTECTOMY Bilateral 03/09/2014   Procedure: BILATERAL EXCISION GYNECOMASTIA;  Surgeon: Robyne Askew, MD;  Location: [facility] OR;  Service: General;  Laterality: Bilateral;  . ULNAR NERVE REPAIR Left        Home Medications    Prior to Admission medications   Medication Sig Start Date End Date Taking? Authorizing Provider  amphetamine-dextroamphetamine (ADDERALL) 20 MG tablet Take 1 tablet by mouth 2 (two) times daily. 11/18/18   [provider]  Multiple Vitamin (MULTIVITAMIN WITH MINERALS) TABS tablet Take 1 tablet by mouth daily.    [provider]  ocrelizumab 600 mg in sodium chloride 0.9 % 500 mL Inject 600 mg into the vein every 6 (six) months.    [provider]  ondansetron (ZOFRAN) 8 MG tablet Take 0.5 tablets (4 mg total) by mouth daily as needed for nausea or vomiting. Patient not taking: Reported on 09/18/2019 10/11/17   Asa Lente, MD  Rivaroxaban 15 & 20 MG TBPK Follow package directions: Take one 15mg  tablet by mouth twice a day. On day 22, switch to one 20mg  tablet once a day. Take  with food. 08/22/19   Domenic Moras, PA-C  sildenafil (VIAGRA) 100 MG tablet Take 1 tablet (100 mg total) by mouth daily as needed for erectile dysfunction. 12/12/18   Sater, Nanine Means, MD  simvastatin (ZOCOR) 20 MG tablet Take 20 mg by mouth daily.    [provider]  zolpidem (AMBIEN) 10 MG tablet Take 10 mg by mouth at bedtime as needed for sleep.    [provider]    Family History Family History  Problem Relation Age of Onset  . Breast cancer Mother   . Hypertension Father   . Prostate cancer Father   . Clotting disorder Father   . Hyperlipidemia Father   . Hypertension Brother   . Hypertension Brother   . Bladder Cancer Paternal Grandfather     . Stroke Paternal Grandfather     Social History Social History   Tobacco Use  . Smoking status: Former Smoker    Types: Cigarettes  . Smokeless tobacco: Never Used  . Tobacco comment: teenage years  Substance Use Topics  . Alcohol use: Yes    Alcohol/week: 0.0 standard drinks    Comment: social  . Drug use: No     Allergies   Sulfa antibiotics   Review of Systems Review of Systems  Constitutional: Negative for activity change.  Musculoskeletal: Negative for joint swelling.  Skin: Negative for color change, pallor and wound.  All other systems reviewed and are negative.    Physical Exam Triage Vital Signs ED Triage Vitals  Enc Vitals Group     BP 10/28/19 0913 121/78     Pulse Rate 10/28/19 0913 76     Resp 10/28/19 0913 16     Temp 10/28/19 0913 98.6 F (37 C)     Temp Source 10/28/19 0913 Oral     SpO2 10/28/19 0913 99 %     Weight --      Height --      Head Circumference --      Peak Flow --      Pain Score 10/28/19 0912 6     Pain Loc --      Pain Edu? --      Excl. in [place]? --    No data found.  Updated Vital Signs BP 121/78 (BP Location: Right Arm)   Pulse 76   Temp 98.6 F (37 C) (Oral)   Resp 16   SpO2 99%   Visual Acuity Right Eye Distance:   Left Eye Distance:   Bilateral Distance:    Right Eye Near:   Left Eye Near:    Bilateral Near:     Physical Exam Constitutional:      General: He is not in acute distress. HENT:     Head: Atraumatic.  Eyes:     Pupils: Pupils are equal, round, and reactive to light.  Cardiovascular:     Rate and Rhythm: Normal rate.  Pulmonary:     Effort: Pulmonary effort is normal.  Musculoskeletal:     Right lower leg: No edema.     Left lower leg: No edema.     Left foot: Normal range of motion and normal capillary refill. Tenderness and bony tenderness present. No swelling, deformity, laceration or crepitus. Normal pulse.       Feet:     Comments: Left foot has mild tenderness to palpation  over the first MTP joint without swelling or ecchymosis.  First MTP joint has full range of motion.  Distal neurovascular function  is intact.   Skin:    General: Skin is warm and dry.  Neurological:     Mental Status: He is alert.      UC Treatments / Results  Labs (all labs ordered are listed, but only abnormal results are displayed) Labs Reviewed - No data to display  EKG   Radiology DG Foot Complete Left  Result Date: 10/28/2019 CLINICAL DATA:  Foot pain post injury, dropped safe on foot pain at base of big toe and first metatarsal. EXAM: LEFT FOOT - COMPLETE 3+ VIEW COMPARISON:  07/18/2016 FINDINGS: Posttraumatic deformity of the right fifth metatarsal. Bipartite sesamoid bone associated with the great toe medially. Mild degenerative changes in the first metatarsal phalangeal joint and in the interphalangeal joint of the great toe. No sign of acute fracture. Lateral view limited by overlap. IMPRESSION: No acute fracture or dislocation. Degenerative changes. Mildly limited study due to lateral view with overlap. Electronically Signed   By: Donzetta Kohut M.D.   On: 10/28/2019 09:46    Procedures Procedures (including critical care time)  Medications Ordered in UC Medications - No data to display  Initial Impression / Assessment and Plan / UC Course  I have reviewed the triage vital signs and the nursing notes.  Pertinent labs & imaging results that were available during my care of the patient were reviewed by me and considered in my medical decision making (see chart for details).    No evidence fracture.  Followup with Family Doctor if not improved in one week.    Final Clinical Impressions(s) / UC Diagnoses   Final diagnoses:  Contusion of left foot, initial encounter     Discharge Instructions     Apply ice pack for 10 to 15 minutes, 2 to 3 times daily  Continue until pain and swelling decrease.  May take Ibuprofen 200mg , 4 tabs every 8 hours with food.     ED  Prescriptions    None        , MD 10/28/19 1017

## 2019-10-28 NOTE — ED Triage Notes (Signed)
Patient presents to Urgent Care with complaints of left foot pain since dropping a gun safe on his foot yesterday. Patient reports no tingling or numbness.

## 2019-11-03 ENCOUNTER — Other Ambulatory Visit: Payer: Self-pay

## 2019-11-03 ENCOUNTER — Other Ambulatory Visit (INDEPENDENT_AMBULATORY_CARE_PROVIDER_SITE_OTHER): Payer: 59

## 2019-11-03 DIAGNOSIS — R7989 Other specified abnormal findings of blood chemistry: Secondary | ICD-10-CM

## 2019-11-03 LAB — HEPATIC FUNCTION PANEL
ALT: 91 U/L — ABNORMAL HIGH (ref 0–53)
AST: 33 U/L (ref 0–37)
Albumin: 4.7 g/dL (ref 3.5–5.2)
Alkaline Phosphatase: 74 U/L (ref 39–117)
Bilirubin, Direct: 0.2 mg/dL (ref 0.0–0.3)
Total Bilirubin: 1.2 mg/dL (ref 0.2–1.2)
Total Protein: 6.9 g/dL (ref 6.0–8.3)

## 2019-11-06 ENCOUNTER — Telehealth: Payer: Self-pay | Admitting: Internal Medicine

## 2019-11-06 NOTE — Telephone Encounter (Signed)
[facility] HIM Dept faxed request for medical records to [facility] 11/06/19  KLM

## 2019-11-13 ENCOUNTER — Ambulatory Visit: Payer: 59 | Admitting: Internal Medicine

## 2019-11-20 ENCOUNTER — Ambulatory Visit: Payer: 59 | Admitting: Internal Medicine

## 2019-12-11 ENCOUNTER — Telehealth: Payer: 59 | Admitting: Family Medicine

## 2019-12-19 ENCOUNTER — Other Ambulatory Visit (INDEPENDENT_AMBULATORY_CARE_PROVIDER_SITE_OTHER): Payer: 59

## 2019-12-19 DIAGNOSIS — R7989 Other specified abnormal findings of blood chemistry: Secondary | ICD-10-CM | POA: Diagnosis not present

## 2019-12-19 LAB — HEPATIC FUNCTION PANEL
ALT: 67 U/L — ABNORMAL HIGH (ref 0–53)
AST: 26 U/L (ref 0–37)
Albumin: 4.7 g/dL (ref 3.5–5.2)
Alkaline Phosphatase: 84 U/L (ref 39–117)
Bilirubin, Direct: 0.1 mg/dL (ref 0.0–0.3)
Total Bilirubin: 1.1 mg/dL (ref 0.2–1.2)
Total Protein: 7.1 g/dL (ref 6.0–8.3)

## 2019-12-23 ENCOUNTER — Encounter: Payer: Self-pay | Admitting: Internal Medicine

## 2019-12-23 ENCOUNTER — Ambulatory Visit: Payer: 59 | Admitting: Internal Medicine

## 2019-12-23 VITALS — BP 90/64 | HR 68 | Ht 66.0 in | Wt 176.0 lb

## 2019-12-23 DIAGNOSIS — R7989 Other specified abnormal findings of blood chemistry: Secondary | ICD-10-CM | POA: Diagnosis not present

## 2019-12-23 DIAGNOSIS — R143 Flatulence: Secondary | ICD-10-CM | POA: Diagnosis not present

## 2019-12-23 NOTE — Patient Instructions (Signed)
Your provider has requested that you go to the basement level for lab in three months. Press "B" on the elevator. The lab is located at the first door on the left as you exit the elevator.  Please follow up in one year

## 2019-12-23 NOTE — Progress Notes (Signed)
HISTORY OF PRESENT ILLNESS:  Jeremy [name] is a pleasant 39 y.o. male, [name] native, with a history of multiple sclerosis, DVT on rivaroxaban, and G6PD deficiency who presents today for follow-up regarding evaluation of elevated liver tests.  He has had varying degrees of elevated hepatic transaminases intermittently for some time.  Initially evaluated in this office September 18, 2019.  See that dictation for details.  Multiple viral nonviral studies were obtained.  All were unremarkable or normal except for elevated ferritin.  Subsequent genetic analysis for hemochromatosis returned negative.  Haptoglobin was 14.  He feels well.  Previous CT of the abdomen with contrast December 2020 revealed changes of hepatic steatosis.  Liver otherwise normal.  Today reports longstanding problems with abdominal cramping related to gas.  No alarm features.  Testing for celiac disease was negative.  His most recent liver tests were entirely normal except for a mildly elevated ALT at 67.  REVIEW OF SYSTEMS:  All non-GI ROS negative.  Past Medical History:  Diagnosis Date  . Anemia    G6PD  . Anxiety   . Back pain, chronic    low  . Blood transfusion without reported diagnosis   . Depression   . DVT (deep venous thrombosis) ([place])   . Dyslipidemia   . Gait disturbance   . Gynecomastia, male   . Hx of dizziness   . Hypothyroidism   . MS (multiple sclerosis) (HCC)    probable  . PONV (postoperative nausea and vomiting)    Nausea  . Snoring    "mild" sleep apnea. Pt was not required to wear CPAP machine, instead he uses nasal spray  . Testosterone deficiency   . Vision abnormalities     Past Surgical History:  Procedure Laterality Date  . BUNIONECTOMY Right   . GYNECOMASTIA EXCISION     X 2  . GYNECOMASTIA MASTECTOMY Bilateral 03/09/2014   Procedure: BILATERAL EXCISION GYNECOMASTIA;  Surgeon: Merrie Roof, MD;  Location: [place];  Service: General;  Laterality: Bilateral;  . ULNAR  NERVE REPAIR Left     Social History [name]  reports that he has quit smoking. His smoking use included cigarettes. He has never used smokeless tobacco. He reports current alcohol use. He reports that he does not use drugs.  family history includes Bladder Cancer in his paternal grandfather; Breast cancer in his mother; Clotting disorder in his father; Hyperlipidemia in his father; Hypertension in his brother, brother, and father; Prostate cancer in his father; Stroke in his paternal grandfather.  Allergies  Allergen Reactions  . Sulfa Antibiotics Nausea Only       PHYSICAL EXAMINATION:  Vital signs: BP 90/64   Pulse 68   Ht '5\' 6"'  (1.676 m)   Wt 176 lb (79.8 kg)   BMI 28.41 kg/m   Constitutional: generally well-appearing, no acute distress Psychiatric: alert and oriented x3, cooperative Eyes: extraocular movements intact, anicteric, conjunctiva pink Mouth: oral pharynx moist, no lesions Neck: supple no lymphadenopathy Cardiovascular: heart regular rate and rhythm, no murmur Lungs: clear to auscultation bilaterally Abdomen: soft, nontender, nondistended, no obvious ascites, no peritoneal signs, normal bowel sounds, no organomegaly Rectal: Omitted Extremities: no lower extremity edema bilaterally Skin: no lesions on visible extremities Neuro: No focal deficits.  Cranial nerves intact  ASSESSMENT:  1.  Intermittent elevation of hepatic transaminases.  Negative extensive work-up.  Question related to G6PD deficiency.  Mixed information in the literature regarding this condition and liver test abnormalities.  Mild hepatic steatosis  type changes on CT noted.  No evidence for clinically relevant liver disease. 2.  Chronic intermittent issues with intestinal gas associated cramping.  Nonspecific.  No alarm features  PLAN:  1.  Monitor liver tests every 3 months 2.  Office follow-up regarding his liver 1 year 3.  Discussion on intestinal gas.  May try probiotic or yogurt.  I  asked my nurse to provide literature on the topic for his review Total time of 30 minutes was required preparing to see the patient.  Reviewing a myriad of tests results, obtaining interval history, performing medically appropriate exam, counseling the patient regarding his above listed issues, ordering future test, making recommendations, and follow-up recommendations.  Finally, documenting clinical information in the health record

## 2020-01-16 ENCOUNTER — Inpatient Hospital Stay: Payer: 59 | Attending: Hematology and Oncology

## 2020-01-16 ENCOUNTER — Encounter: Payer: Self-pay | Admitting: *Deleted

## 2020-01-16 ENCOUNTER — Inpatient Hospital Stay: Payer: 59 | Admitting: Hematology and Oncology

## 2020-01-16 ENCOUNTER — Other Ambulatory Visit: Payer: Self-pay | Admitting: Hematology and Oncology

## 2020-01-16 ENCOUNTER — Other Ambulatory Visit: Payer: Self-pay

## 2020-01-16 ENCOUNTER — Encounter: Payer: Self-pay | Admitting: Hematology and Oncology

## 2020-01-16 VITALS — BP 120/68 | HR 69 | Temp 97.5°F | Resp 18 | Ht 66.0 in | Wt 176.0 lb

## 2020-01-16 DIAGNOSIS — Z86718 Personal history of other venous thrombosis and embolism: Secondary | ICD-10-CM | POA: Diagnosis not present

## 2020-01-16 DIAGNOSIS — D75A Glucose-6-phosphate dehydrogenase (G6PD) deficiency without anemia: Secondary | ICD-10-CM

## 2020-01-16 DIAGNOSIS — R7989 Other specified abnormal findings of blood chemistry: Secondary | ICD-10-CM | POA: Diagnosis not present

## 2020-01-16 DIAGNOSIS — Z7901 Long term (current) use of anticoagulants: Secondary | ICD-10-CM | POA: Diagnosis not present

## 2020-01-16 DIAGNOSIS — Z79899 Other long term (current) drug therapy: Secondary | ICD-10-CM | POA: Diagnosis not present

## 2020-01-16 DIAGNOSIS — I82511 Chronic embolism and thrombosis of right femoral vein: Secondary | ICD-10-CM

## 2020-01-16 DIAGNOSIS — I82461 Acute embolism and thrombosis of right calf muscular vein: Secondary | ICD-10-CM

## 2020-01-16 LAB — CMP (CANCER CENTER ONLY)
ALT: 49 U/L — ABNORMAL HIGH (ref 0–44)
AST: 25 U/L (ref 15–41)
Albumin: 4.4 g/dL (ref 3.5–5.0)
Alkaline Phosphatase: 89 U/L (ref 38–126)
Anion gap: 10 (ref 5–15)
BUN: 13 mg/dL (ref 6–20)
CO2: 25 mmol/L (ref 22–32)
Calcium: 9.6 mg/dL (ref 8.9–10.3)
Chloride: 108 mmol/L (ref 98–111)
Creatinine: 1.06 mg/dL (ref 0.61–1.24)
GFR, Est AFR Am: >60 mL/min (ref >60)
GFR, Estimated: >60 mL/min (ref >60)
Glucose, Bld: 87 mg/dL (ref 70–99)
Potassium: 4.5 mmol/L (ref 3.5–5.1)
Sodium: 143 mmol/L (ref 135–145)
Total Bilirubin: 0.5 mg/dL (ref 0.3–1.2)
Total Protein: 7 g/dL (ref 6.5–8.1)

## 2020-01-16 LAB — CBC WITH DIFFERENTIAL (CANCER CENTER ONLY)
Abs Immature Granulocytes: 0.01 10*3/uL (ref 0.00–0.07)
Basophils Absolute: 0 10*3/uL (ref 0.0–0.1)
Basophils Relative: 1 %
Eosinophils Absolute: 0.1 10*3/uL (ref 0.0–0.5)
Eosinophils Relative: 2 %
HCT: 48.5 % (ref 39.0–52.0)
Hemoglobin: 15.6 g/dL (ref 13.0–17.0)
Immature Granulocytes: 0 %
Lymphocytes Relative: 20 %
Lymphs Abs: 1 10*3/uL (ref 0.7–4.0)
MCH: 31.2 pg (ref 26.0–34.0)
MCHC: 32.2 g/dL (ref 30.0–36.0)
MCV: 97 fL (ref 80.0–100.0)
Monocytes Absolute: 0.5 10*3/uL (ref 0.1–1.0)
Monocytes Relative: 9 %
Neutro Abs: 3.4 10*3/uL (ref 1.7–7.7)
Neutrophils Relative %: 68 %
Platelet Count: 181 10*3/uL (ref 150–400)
RBC: 5 MIL/uL (ref 4.22–5.81)
RDW: 12.1 % (ref 11.5–15.5)
WBC Count: 5.1 10*3/uL (ref 4.0–10.5)
nRBC: 0 % (ref 0.0–0.2)

## 2020-01-16 NOTE — Progress Notes (Signed)
[facility] [phone]   [phone]  PROGRESS NOTE  Patient Care Team: Merri Brunette, MD as PCP - General (Internal Medicine)  Hematological/Oncological History # Right Lower Extremity Deep Vein Thrombosis 1) 08/22/2019: presented to the ED with 5 days of right calf pain. US of the LE showed extensive DVT in the right lower extremity, seen from the femoral vein into multiple calf branches. Started on Xarelto 2) 09/15/2019: establish care with Dr. Leonides Schanz  3) 02/22/2020: anticipated last day of Xarelto therapy. Transition to 81mg  ASA PO daily.   Interval History:  Jeremy [name] 39 y.o. male with medical history significant for a provoked RLE DVT who presents for a follow up visit. The patient's last visit was on 09/15/2019 at which time he established care. In the interim since the last visit Jeremy [name] has established with GI for his elevated LFTs, which are currently improving without intervention.   On exam today Jeremy [name] notes that he feels well.  Reports he has tolerated the Xarelto therapy well with no serious bleeding, bruising, or dark stools.  Does report he keeps a dehumidifier in his home and does occasionally have nosebleeds, but he is able to control them.  He reports that he has a history of nosebleeds and previously had the right nostril cauterized.  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  He also reports that he has been doing well with no recent fevers, chills, sweats, nausea, vomiting or diarrhea.  He is being followed by GI for his elevated LFTs though these have recently trended down to almost normal with no acute intervention.  At this time the etiology of his LFT bump is not entirely clear.  He denies having any issues with abdominal pain or changes in appetite.  Overall he reports he is at his baseline level health and is eager to  get off Xarelto therapy.  A full 10 point ROS is listed below.  MEDICAL HISTORY:  Past Medical History:  Diagnosis Date  . Anemia    G6PD  . Anxiety   . Back pain, chronic    low  . Blood transfusion without reported diagnosis   . Depression   . DVT (deep venous thrombosis) (HCC)   . Dyslipidemia   . Gait disturbance   . Gynecomastia, male   . Hx of dizziness   . Hypothyroidism   . MS (multiple sclerosis) (HCC)    probable  . PONV (postoperative nausea and vomiting)    Nausea  . Snoring    "mild" sleep apnea. Pt was not required to wear CPAP machine, instead he uses nasal spray  . Testosterone deficiency   . Vision abnormalities     SURGICAL HISTORY: Past Surgical History:  Procedure Laterality Date  . BUNIONECTOMY Right   . GYNECOMASTIA EXCISION     X 2  . GYNECOMASTIA MASTECTOMY Bilateral 03/09/2014   Procedure: BILATERAL EXCISION GYNECOMASTIA;  Surgeon: 03/11/2014, MD;  Location: [facility] OR;  Service: General;  Laterality: Bilateral;  . ULNAR NERVE REPAIR Left     SOCIAL HISTORY: Social History   Socioeconomic History  . Marital status: Single    Spouse name: Not on file  . Number of children: 0  . Years of education: College   . Highest education level: Not on file  Occupational History  . Occupation: truck [employer]  Tobacco Use  .  Smoking status: Former Smoker    Types: Cigarettes  . Smokeless tobacco: Never Used  . Tobacco comment: teenage years  Vaping Use  . Vaping Use: Never used  Substance and Sexual Activity  . Alcohol use: Yes    Alcohol/week: 0.0 standard drinks    Comment: social  . Drug use: No  . Sexual activity: Not on file  Other Topics Concern  . 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    Patient has a 4 year college education.    Patient has no children.          Social Determinants of Health   Financial Resource Strain:   . Difficulty of Paying Living Expenses:   Food Insecurity:   .  Worried About Programme researcher, broadcasting/film/video in the Last Year:   . Barista in the Last Year:   Transportation Needs:   . Freight forwarder (Medical):   Marland Kitchen Lack of Transportation (Non-Medical):   Physical Activity:   . Days of Exercise per Week:   . Minutes of Exercise per Session:   Stress:   . Feeling of Stress :   Social Connections:   . Frequency of Communication with Friends and Family:   . Frequency of Social Gatherings with Friends and Family:   . Attends Religious Services:   . Active Member of Clubs or Organizations:   . Attends Banker Meetings:   Marland Kitchen Marital Status:   Intimate Partner Violence:   . Fear of Current or Ex-Partner:   . Emotionally Abused:   Marland Kitchen Physically Abused:   . Sexually Abused:     FAMILY HISTORY: Family History  Problem Relation Age of Onset  . Breast cancer Mother   . Hypertension Father   . Prostate cancer Father   . Clotting disorder Father   . Hyperlipidemia Father   . Hypertension Brother   . Hypertension Brother   . Bladder Cancer Paternal Grandfather   . Stroke Paternal Grandfather     ALLERGIES:  is allergic to sulfa antibiotics.  MEDICATIONS:  Current Outpatient Medications  Medication Sig Dispense Refill  . amphetamine-dextroamphetamine (ADDERALL) 20 MG tablet Take 1 tablet by mouth 2 (two) times daily.    . Multiple Vitamin (MULTIVITAMIN WITH MINERALS) TABS tablet Take 1 tablet by mouth daily.    Marland Kitchen ocrelizumab 600 mg in sodium chloride 0.9 % 500 mL Inject 600 mg into the vein every 6 (six) months.    . ondansetron (ZOFRAN) 8 MG tablet Take 0.5 tablets (4 mg total) by mouth daily as needed for nausea or vomiting. 30 tablet 5  . Rivaroxaban 15 & 20 MG TBPK Follow package directions: Take one 15mg  tablet by mouth twice a day. On day 22, switch to one 20mg  tablet once a day. Take with food. 51 each 0  . sildenafil (VIAGRA) 100 MG tablet Take 1 tablet (100 mg total) by mouth daily as needed for erectile dysfunction. 30  tablet 3  . simvastatin (ZOCOR) 20 MG tablet Take 20 mg by mouth daily.    zolpidem (AMBIEN) 10 MG tablet Take 10 mg by mouth at bedtime as needed for sleep.     No current facility-administered medications for this visit.   Facility-Administered Medications Ordered in Other Visits  Medication Dose Route Frequency Provider Last Rate Last Admin  . gadopentetate dimeglumine (MAGNEVIST) injection 15 mL  15 mL Intravenous Once PRN Sater, , MD      .  gadopentetate dimeglumine (MAGNEVIST) injection 16 mL  16 mL Intravenous Once PRN Sater, Nanine Means, MD        REVIEW OF SYSTEMS:   Constitutional: ( - ) fevers, ( - )  chills , ( - ) night sweats Eyes: ( - ) blurriness of vision, ( - ) double vision, ( - ) watery eyes Ears, nose, mouth, throat, and face: ( - ) mucositis, ( - ) sore throat Respiratory: ( - ) cough, ( - ) dyspnea, ( - ) wheezes Cardiovascular: ( - ) palpitation, ( - ) chest discomfort, ( - ) lower extremity swelling Gastrointestinal:  ( - ) nausea, ( - ) heartburn, ( - ) change in bowel habits Skin: ( - ) abnormal skin rashes Lymphatics: ( - ) new lymphadenopathy, ( - ) easy bruising Neurological: ( - ) numbness, ( - ) tingling, ( - ) new weaknesses Behavioral/Psych: ( - ) mood change, ( - ) new changes  All other systems were reviewed with the patient and are negative.  PHYSICAL EXAMINATION: ECOG PERFORMANCE STATUS: 0 - Asymptomatic  Vitals:   01/16/20 0953  BP: 120/68  Pulse: 69  Resp: 18  Temp: (!) 97.5 F (36.4 C)  SpO2: 99%   Filed Weights   01/16/20 0953  Weight: 176 lb (79.8 kg)    GENERAL: well appearing young Caucasian male alert, no distress and comfortable SKIN: skin color, texture, turgor are normal, no rashes or significant lesions EYES: conjunctiva are pink and non-injected, sclera clear LUNGS: clear to auscultation and percussion with normal breathing effort HEART: regular rate & rhythm and no murmurs and no lower extremity  edema Musculoskeletal: no cyanosis of digits and no clubbing  PSYCH: alert & oriented x 3, fluent speech NEURO: no focal motor/sensory deficits  LABORATORY DATA:  I have reviewed the data as listed CBC Latest Ref Rng & Units 01/16/2020 09/15/2019 09/15/2019  WBC 4.0 - 10.5 K/uL 5.1 5.1 -  Hemoglobin 13.0 - 17.0 g/dL 15.6 14.3 14.7  Hematocrit 39 - 52 % 48.5 45.1 -  Platelets 150 - 400 K/uL 181 172 -    CMP Latest Ref Rng & Units 01/16/2020 12/19/2019 11/03/2019  Glucose 70 - 99 mg/dL 87 - -  BUN 6 - 20 mg/dL 13 - -  Creatinine 0.61 - 1.24 mg/dL 1.06 - -  Sodium 135 - 145 mmol/L 143 - -  Potassium 3.5 - 5.1 mmol/L 4.5 - -  Chloride 98 - 111 mmol/L 108 - -  CO2 22 - 32 mmol/L 25 - -  Calcium 8.9 - 10.3 mg/dL 9.6 - -  Total Protein 6.5 - 8.1 g/dL 7.0 7.1 6.9  Total Bilirubin 0.3 - 1.2 mg/dL 0.5 1.1 1.2  Alkaline Phos 38 - 126 U/L 89 84 74  AST 15 - 41 U/L 25 26 33  ALT 0 - 44 U/L 49(H) 67(H) 91(H)    RADIOGRAPHIC STUDIES: No results found.  ASSESSMENT & PLAN Jeremy [name] 39 y.o. male with medical history significant for a provoked RLE DVT who presents for a follow up visit.  Today we discussed treatment options moving forward for his provoked right lower extremity DVT.  The patient will complete 6 months of anticoagulation therapy on 02/22/2020.  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  I have offered to the patient indefinite anticoagulation with Xarelto at 10 mg p.o. daily versus 81 mg p.o. aspirin daily with  compression stockings and every 2 hour breaks for stretches.  The patient noted that his preference would be for p.o. daily aspirin as thromboprophylaxis and that he is eager to get off the Xarelto therapy.  Of note he has tolerated the Xarelto therapy well without any bleeding, bruising, or dark stools.  Given that the patient will no longer be on anticoagulation therapy we do not need to  have him follow in our clinic routinely.  We are happy to see him back in the event that he were to have any other hematological abnormalities or concern for new DVT.  In the event he would develop pain or swelling in his lower extremity we would arrange for an ultrasound to be performed and have him return to our clinic.  #Right Lower Extremity Deep Vein Thrombosis --continue Xarelto 20mg  for at least 6 months of total therapy. Last day of Xarelto should be 02/22/2020.  --no evidence of PE or clot burden elsewhere --labs negative for APS, FVL, and Prothrombin gene mutation.  --given that his driving is not a modifiable risk factor we recommend some form of DVT prophylaxis. We noted he could continue Xarelto indefinitely at a lower dose (10mg  PO maintenance dose) or take 81mg  ASA daily with compression stockings/ q2H breaks. The patient stated he would prefer the ASA 81mg  PO daily with DVT prevention techniques.  --RTC as need for symptoms concerning for DVT  #Elevated LFTs, improved  --known to have mild elevations of LFTs in the past, but there had been a marked increase since his last check in Oct 2020 --possible that Xarelto is causing this (can cause LFTs elevations) though normally not this severe. --pattern is not consistent with an Alcoholic hepatitis (ALT/AST ratio is off). Additionally patient notes he does not consume EtOH.  --LFTs appear to be improving without intervention.  --appreicate [facility] workup.   #G6PD Deficiency --confirmed with lab testing on 09/15/2019 with GGPDH <0.3 --avoid oxidating agents such as fava beans in the interim --request that he notify providers prior to receiving medications if found to have the disorder.    No orders of the defined types were placed in this encounter.   All questions were answered. The patient knows to call the clinic with any problems, questions or concerns.  A total of more than 30 minutes were spent on this encounter  and over half of that time was spent on counseling and coordination of care as outlined above.   , MD Department of Hematology/Oncology [facility] Phone: [phone] Pager: [phone] Email: [email or web address]  01/16/2020 10:58 AM

## 2020-01-19 ENCOUNTER — Other Ambulatory Visit: Payer: 59

## 2020-01-19 ENCOUNTER — Ambulatory Visit: Payer: 59 | Admitting: Hematology and Oncology

## 2020-03-18 ENCOUNTER — Other Ambulatory Visit: Payer: Self-pay

## 2020-03-18 ENCOUNTER — Encounter: Payer: Self-pay | Admitting: Family Medicine

## 2020-03-18 ENCOUNTER — Ambulatory Visit: Payer: 59 | Admitting: Family Medicine

## 2020-03-18 VITALS — BP 107/73 | HR 82 | Ht 66.0 in | Wt 181.4 lb

## 2020-03-18 DIAGNOSIS — R350 Frequency of micturition: Secondary | ICD-10-CM

## 2020-03-18 DIAGNOSIS — R5383 Other fatigue: Secondary | ICD-10-CM

## 2020-03-18 DIAGNOSIS — R202 Paresthesia of skin: Secondary | ICD-10-CM

## 2020-03-18 DIAGNOSIS — N521 Erectile dysfunction due to diseases classified elsewhere: Secondary | ICD-10-CM

## 2020-03-18 DIAGNOSIS — G35 Multiple sclerosis: Secondary | ICD-10-CM | POA: Diagnosis not present

## 2020-03-18 DIAGNOSIS — Z79899 Other long term (current) drug therapy: Secondary | ICD-10-CM

## 2020-03-18 DIAGNOSIS — E559 Vitamin D deficiency, unspecified: Secondary | ICD-10-CM

## 2020-03-18 NOTE — Progress Notes (Signed)
I have read the note, and I agree with the clinical assessment and plan.  Vannary Greening A. Chrisopher Pustejovsky, MD, PhD, FAAN Certified in Neurology, Clinical Neurophysiology, Sleep Medicine, Pain Medicine and Neuroimaging  [facility] [address], [address] [place], [place] [address] [phone]  

## 2020-03-18 NOTE — Patient Instructions (Addendum)
Continue Ocrevus infusions as directed. Continue close follow up with PCP as advised.   Follow up with [place] in 6 months   Multiple Sclerosis Multiple sclerosis (MS) is a disease of the brain, spinal cord, and optic nerves (central nervous system). It causes the body's disease-fighting (immune) system to destroy the protective covering (myelin sheath) around nerves in the brain. When this happens, signals (nerve impulses) going to and from the brain and spinal cord do not get sent properly or may not get sent at all. There are several types of MS:  Relapsing-remitting MS. This is the most common type. This causes sudden attacks of symptoms. After an attack, you may recover completely until the next attack, or some symptoms may remain permanently.  Secondary progressive MS. This usually develops after the onset of relapsing-remitting MS. Similar to relapsing-remitting MS, this type also causes sudden attacks of symptoms. Attacks may be less frequent, but symptoms slowly get worse (progress) over time.  Primary progressive MS. This causes symptoms that steadily progress over time. This type of MS does not cause sudden attacks of symptoms. The age of onset of MS varies, but it often develops between 35-67 years of age. MS is a lifelong (chronic) condition. There is no cure, but treatment can help slow down the progression of the disease. What are the causes? The cause of this condition is not known. What increases the risk? You are more likely to develop this condition if:  You are a woman.  You have a relative with MS. However, the condition is not passed from parent to child (inherited).  You have a lack (deficiency) of vitamin D.  You smoke. MS is more common in the [place] than in the [place]. What are the signs or symptoms? Relapsing-remitting and secondary progressive MS cause symptoms to occur in episodes or attacks that may last weeks to months. There may  be long periods between attacks in which there are almost no symptoms. Primary progressive MS causes symptoms to steadily progress after they develop. Symptoms of MS vary because of the many different ways it affects the central nervous system. The main symptoms include:  Vision problems and eye pain.  Numbness.  Weakness.  Inability to move your arms, hands, feet, or legs (paralysis).  Balance problems.  Shaking that you cannot control (tremors).  Muscle spasms.  Problems with thinking (cognitive changes). MS can also cause symptoms that are associated with the disease, but are not always the direct result of an MS attack. They may include:  Inability to control urination or bowel movements (incontinence).  Headaches.  Fatigue.  Inability to tolerate heat.  Emotional changes.  Depression.  Pain. How is this diagnosed? This condition is diagnosed based on:  Your symptoms.  A neurological exam. This involves checking central nervous system function, such as nerve function, reflexes, and coordination.  MRIs of the brain and spinal cord.  Lab tests, including a lumbar puncture that tests the fluid that surrounds the brain and spinal cord (cerebrospinal fluid).  Tests to measure the electrical activity of the brain in response to stimulation (evoked potentials). How is this treated? There is no cure for MS, but medicines can help decrease the number and frequency of attacks and help relieve nuisance symptoms. Treatment options may include:  Medicines that reduce the frequency of attacks. These medicines may be given by injection, by mouth (orally), or through an IV.  Medicines that reduce inflammation (steroids). These may provide short-term relief of  symptoms.  Medicines to help control pain, depression, fatigue, or incontinence.  Vitamin D, if you have a deficiency.  Using devices to help you move around (assistive devices), such as braces, a cane, or a  walker.  Physical therapy to strengthen and stretch your muscles.  Occupational therapy to help you with everyday tasks.  Alternative or complementary treatments such as exercise, massage, or acupuncture. Follow these instructions at home:  Take over-the-counter and prescription medicines only as told by your health care provider.  Do not drive or use heavy machinery while taking prescription pain medicine.  Use assistive devices as recommended by your physical therapist or your health care provider.  Exercise as directed by your health care provider.  Return to your normal activities as told by your health care provider. Ask your health care provider what activities are safe for you.  Reach out for support. Share your feelings with friends, family, or a support group.  Keep all follow-up visits as told by your health care provider and therapists. This is important. Where to find more information  National Multiple Sclerosis Society: https://www.nationalmssociety.org Contact a health care provider if:  You feel depressed.  You develop new pain or numbness.  You have tremors.  You have problems with sexual function. Get help right away if:  You develop paralysis.  You develop numbness.  You have problems with your bladder or bowel function.  You develop double vision.  You lose vision in one or both eyes.  You develop suicidal thoughts.  You develop severe confusion. If you ever feel like you may hurt yourself or others, or have thoughts about taking your own life, get help right away. You can go to your nearest emergency department or call:  Your local emergency services (911 in the [place]  A suicide crisis helpline, such as the National Suicide Prevention Lifeline at 6501743867. This is open 24 hours a day. Summary  Multiple sclerosis (MS) is a disease of the central nervous system that causes the body's immune system to destroy the protective covering  (myelin sheath) around nerves in the brain.  There are 3 types of MS: relapsing-remitting, secondary progressive, and primary progressive. Relapsing-remitting and secondary progressive MS cause symptoms to occur in episodes or attacks that may last weeks to months. Primary progressive MS causes symptoms to steadily progress after they develop.  There is no cure for MS, but medicines can help decrease the number and frequency of attacks and help relieve nuisance symptoms. Treatment may also include physical or occupational therapy.  If you develop numbness, paralysis, vision problems, or other neurological symptoms, get help right away. This information is not intended to replace advice given to you by your health care provider. Make sure you discuss any questions you have with your health care provider. Document Revised: 06/22/2017 Document Reviewed: 09/18/2016 Elsevier Patient Education  2020 [employer].

## 2020-03-18 NOTE — Progress Notes (Signed)
PATIENT: Jeremy [name] DOB: [birth year]  REASON FOR VISIT: follow up HISTORY FROM: patient  Chief Complaint  Patient presents with  . Follow-up    6 month f/u for MS. States he has been doing well since last visit.   Marland Kitchen room 1    alone     HISTORY OF PRESENT ILLNESS: Today 03/18/20 Paydon Carll is a 39 y.o. male here today for follow up for RRMS. He continues to tolerate Ocrevus infusions. Labs stable in 05/2019. MRI brain stable 05/2018.   He was diagnosed with DVT of right lower leg in January. He was started on Xarelto for 6 months, now taking baby aspirin. He was seen by hematology. No etiology found. He does report having a respiratory illness just prior to DVT. He tested negative for COVID. He is not interested in getting vaccination.   He continues Adderall BID for inattention and fatigue (managed by PCP). He is sleeping fairly well. He usually sleeps about 4-5 hours at night. He contributes with to alternating work scheduled. Ambien helps with sleep. He denies grogginess the next morning.   He does have urinary frequency but feels he is able to manage without medication. No changes in gait. No changes in vision. Sildenafil helps with ED.    HISTORY: (copied from Dr Bonnita Hollow note on 06/12/2019)  He feels his MS is stable.    He is on Ocrevus and tolerates it well.    His last infusion was last week.   No exacerbation.  He is walking well.  He denies difficulty with balance, strength or sensation.   He occasionally has tingling in an arm for a few minutes.   He has some nocturia.  Vesicare and Toviaz generics were too expensive so he never started these.  He uses sildenafil as needed for ED.  He notes reduced focus and attention (from PCP).    He feels Adderall helps and and also helps his MS fatigue.  He sleeps well most nights.  He feels mood is doing well.    MS History:   He was diagnosed in January 2012.   In 2009, he had a several week episode of right hand  clumsiness associated with some electric shock sensations. He did not see a doctor about the symptoms. In December 2011, he had the onset of altered taste. A week later, he had nausea and dizziness and over the next couple days these symptoms worsened and he developed right-sided clumsiness and slurred speech. First toward his PCP who felt he might have an ear infection and he was referred to ENT. ENT evaluated him and referred him to Dr. Anne Hahn at [facility] who noted an ataxic gait and ordered an MRI of the spine and brain. Those studies were consistent with multiple sclerosis and he underwent a lumbar puncture in April 2011 that was also consistent with MS. He was started on Rebif in June 2011 and remain on the medicine until September 2014 when a repeat MRI showed the interval development of several more lesions. He was switched to [place] but insurance would no longer cover it so we switched to [place] early 2016.  He had elevated liver enzyme tests on [place] and was switched to every other day but transaminase elevation persisted.  Therefore, he switched to Ocrevus in late 2017.Marland Kitchen     MRIs from 05/25/2014 show a moderate size focus in the left temporal lobe and another 10 fairly small periventricular and deep white matter foci. MRI of the  cervical spine showed 2 foci, one at C4 and one at C6. There were no enhancing lesions on those MRIs.  MRI of the brain 06/16/2015 showed no new lesions.    MRI of the brain 03/15/2016 showed no new lesions.  MRI of the cervical spine 02/08/2017 showed 2 small foci laterally to the right at C4-C5 and laterally to the left at C6.  These were present on the previous MRI.  MRI of the brain 06/19/2018 showed no new lesions.   REVIEW OF SYSTEMS: Out of a complete 14 system review of symptoms, the patient complains only of the following symptoms, erectile dysfunction, insomnia, inattention, fatigue and all other reviewed systems are  negative.   ALLERGIES: Allergies  Allergen Reactions  . Sulfa Antibiotics Nausea Only    HOME MEDICATIONS: Outpatient Medications Prior to Visit  Medication Sig Dispense Refill  . amphetamine-dextroamphetamine (ADDERALL) 20 MG tablet Take 1 tablet by mouth 2 (two) times daily.    . Multiple Vitamin (MULTIVITAMIN WITH MINERALS) TABS tablet Take 1 tablet by mouth daily.    Marland Kitchen ocrelizumab 600 mg in sodium chloride 0.9 % 500 mL Inject 600 mg into the vein every 6 (six) months.    . ondansetron (ZOFRAN) 8 MG tablet Take 0.5 tablets (4 mg total) by mouth daily as needed for nausea or vomiting. 30 tablet 5  . sildenafil (VIAGRA) 100 MG tablet Take 1 tablet (100 mg total) by mouth daily as needed for erectile dysfunction. 30 tablet 3  . simvastatin (ZOCOR) 20 MG tablet Take 40 mg by mouth daily.     Marland Kitchen zolpidem (AMBIEN) 10 MG tablet Take 10 mg by mouth at bedtime as needed for sleep.    . Rivaroxaban 15 & 20 MG TBPK Follow package directions: Take one 15mg  tablet by mouth twice a day. On day 22, switch to one 20mg  tablet once a day. Take with food. 51 each 0  . gadopentetate dimeglumine (MAGNEVIST) injection 15 mL     . gadopentetate dimeglumine (MAGNEVIST) injection 16 mL      No facility-administered medications prior to visit.    PAST MEDICAL HISTORY: Past Medical History:  Diagnosis Date  . Anemia    G6PD  . Anxiety   . Back pain, chronic    low  . Blood transfusion without reported diagnosis   . Depression   . DVT (deep venous thrombosis) (HCC)   . Dyslipidemia   . Gait disturbance   . Gynecomastia, male   . Hx of dizziness   . Hypothyroidism   . MS (multiple sclerosis) (HCC)    probable  . PONV (postoperative nausea and vomiting)    Nausea  . Snoring    "mild" sleep apnea. Pt was not required to wear CPAP machine, instead he uses nasal spray  . Testosterone deficiency   . Vision abnormalities     PAST SURGICAL HISTORY: Past Surgical History:  Procedure Laterality  Date  . BUNIONECTOMY Right   . GYNECOMASTIA EXCISION     X 2  . GYNECOMASTIA MASTECTOMY Bilateral 03/09/2014   Procedure: BILATERAL EXCISION GYNECOMASTIA;  Surgeon: , MD;  Location: [facility] OR;  Service: General;  Laterality: Bilateral;  . ULNAR NERVE REPAIR Left     FAMILY HISTORY: Family History  Problem Relation Age of Onset  . Breast cancer Mother   . Hypertension Father   . Prostate cancer Father   . Clotting disorder Father   . Hyperlipidemia Father   . Hypertension Brother   .  Hypertension Brother   . Bladder Cancer Paternal Grandfather   . Stroke Paternal Grandfather     SOCIAL HISTORY: Social History   Socioeconomic History  . Marital status: Single    Spouse name: Not on file  . Number of children: 0  . Years of education: College   . Highest education level: Not on file  Occupational History  . Occupation: truck Hospital doctor  Tobacco Use  . Smoking status: Former Smoker    Types: Cigarettes  . Smokeless tobacco: Never Used  . Tobacco comment: teenage years  Vaping Use  . Vaping Use: Never used  Substance and Sexual Activity  . Alcohol use: Yes    Alcohol/week: 0.0 standard drinks    Comment: social  . Drug use: No  . Sexual activity: Not on file  Other Topics Concern  . 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    Patient has a 4 year college education.    Patient has no children.          Social Determinants of Health   Financial Resource Strain:   . Difficulty of Paying Living Expenses: Not on file  Food Insecurity:   . Worried About Programme researcher, broadcasting/film/video in the Last Year: Not on file  . Ran Out of Food in the Last Year: Not on file  Transportation Needs:   . Lack of Transportation (Medical): Not on file  . Lack of Transportation (Non-Medical): Not on file  Physical Activity:   . Days of Exercise per Week: Not on file  . Minutes of Exercise per Session: Not on file  Stress:   . Feeling of Stress :  Not on file  Social Connections:   . Frequency of Communication with Friends and Family: Not on file  . Frequency of Social Gatherings with Friends and Family: Not on file  . Attends Religious Services: Not on file  . Active Member of Clubs or Organizations: Not on file  . Attends Banker Meetings: Not on file  . Marital Status: Not on file  Intimate Partner Violence:   . Fear of Current or Ex-Partner: Not on file  . Emotionally Abused: Not on file  . Physically Abused: Not on file  . Sexually Abused: Not on file      PHYSICAL EXAM  Vitals:   03/18/20 1253  BP: 107/73  Pulse: 82  Weight: 181 lb 6.4 oz (82.3 kg)  Height: 5\' 6"  (1.676 m)   Body mass index is 29.28 kg/m.  Generalized: Well developed, in no acute distress  Cardiology: normal rate and rhythm, no murmur noted Respiratory: clear to auscultation bilaterally  Neurological examination  Mentation: Alert oriented to time, place, history taking. Follows all commands speech and language fluent Cranial nerve II-XII: Pupils were equal round reactive to light. Extraocular movements were full, visual field were full on confrontational test. Facial sensation and strength were normal. Uvula tongue midline. Head turning and shoulder shrug  were normal and symmetric. Motor: The motor testing reveals 5 over 5 strength of all 4 extremities. Good symmetric motor tone is noted throughout.  Sensory: Sensory testing is intact to soft touch on all 4 extremities. No evidence of extinction is noted.  Coordination: Cerebellar testing reveals good finger-nose-finger and heel-to-shin bilaterally.  Gait and station: Gait is normal. Tandem gait is normal. Romberg is negative. No drift is seen.  Reflexes: Deep tendon reflexes are symmetric and normal bilaterally.  DIAGNOSTIC DATA (LABS, IMAGING, TESTING) - I reviewed patient records, labs, notes, testing and imaging myself where available.  No flowsheet data found.   Lab  Results  Component Value Date   WBC 5.1 01/16/2020   HGB 15.6 01/16/2020   HCT 48.5 01/16/2020   MCV 97.0 01/16/2020   PLT 181 01/16/2020      Component Value Date/Time   NA 143 01/16/2020 0928   NA 142 06/12/2019 1159   K 4.5 01/16/2020 0928   CL 108 01/16/2020 0928   CO2 25 01/16/2020 0928   GLUCOSE 87 01/16/2020 0928   BUN 13 01/16/2020 0928   BUN 15 06/12/2019 1159   CREATININE 1.06 01/16/2020 0928   CALCIUM 9.6 01/16/2020 0928   PROT 7.0 01/16/2020 0928   PROT 7.0 06/12/2019 1159   ALBUMIN 4.4 01/16/2020 0928   ALBUMIN 4.8 06/12/2019 1159   AST 25 01/16/2020 0928   ALT 49 (H) 01/16/2020 0928   ALKPHOS 89 01/16/2020 0928   BILITOT 0.5 01/16/2020 0928   GFRNONAA >60 01/16/2020 0928   GFRAA >60 01/16/2020 0928   No results found for: CHOL, HDL, LDLCALC, LDLDIRECT, TRIG, CHOLHDL No results found for: ZSWF0X No results found for: VITAMINB12 No results found for: TSH     ASSESSMENT AND PLAN 39 y.o. year old male  has a past medical history of Anemia, Anxiety, Back pain, chronic, Blood transfusion without reported diagnosis, Depression, DVT (deep venous thrombosis) (HCC), Dyslipidemia, Gait disturbance, Gynecomastia, male, dizziness, Hypothyroidism, MS (multiple sclerosis) (HCC), PONV (postoperative nausea and vomiting), Snoring, Testosterone deficiency, and Vision abnormalities. here with     ICD-10-CM   1. Relapsing remitting multiple sclerosis (HCC)  G35   2. High risk medication use  Z79.899   3. Paresthesia  R20.2   4. Urinary frequency  R35.0   5. Other fatigue  R53.83   6. Erectile dysfunction due to diseases classified elsewhere  N52.1   7. Vitamin D deficiency  E55.9     Anibal is doing well. He continues to tolerate Ocrevus. We will continue current treatment plan. Labs reviewed from 01/16/2020 with oncology. CBC normal. ALT slightly elevated but declining from previous readings post viral illness and DVT. We will recheck labs in 6 months. He was encouraged to  continue healthy lifestyle habits. Signs and symptoms of DVT reviewed. 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 I have encouraged him to consider getting Covid vaccine. He will follow up in 6 months, sooner if needed.    No orders of the defined types were placed in this encounter.    No orders of the defined types were placed in this encounter.     I spent 30 minutes with the patient. 50% of this time was spent counseling and educating patient on plan of care and medications.    Shawnie Dapper, FNP-C 03/18/2020, 2:34 PM [facility] [address] [place], [place] [address] [phone]

## 2020-04-01 ENCOUNTER — Encounter: Payer: Self-pay | Admitting: Hematology and Oncology

## 2020-04-01 ENCOUNTER — Other Ambulatory Visit: Payer: Self-pay

## 2020-04-01 ENCOUNTER — Telehealth: Payer: Self-pay | Admitting: *Deleted

## 2020-04-01 ENCOUNTER — Other Ambulatory Visit: Payer: Self-pay | Admitting: *Deleted

## 2020-04-01 ENCOUNTER — Ambulatory Visit (HOSPITAL_COMMUNITY)
Admission: RE | Admit: 2020-04-01 | Discharge: 2020-04-01 | Disposition: A | Discharge status: Home / Self Care | Payer: 59 | Source: Ambulatory Visit | Attending: Hematology and Oncology | Admitting: Hematology and Oncology

## 2020-04-01 DIAGNOSIS — M7989 Other specified soft tissue disorders: Secondary | ICD-10-CM | POA: Diagnosis not present

## 2020-04-01 DIAGNOSIS — Z86718 Personal history of other venous thrombosis and embolism: Secondary | ICD-10-CM | POA: Diagnosis not present

## 2020-04-01 DIAGNOSIS — M79661 Pain in right lower leg: Secondary | ICD-10-CM | POA: Insufficient documentation

## 2020-04-01 DIAGNOSIS — M79609 Pain in unspecified limb: Secondary | ICD-10-CM

## 2020-04-01 NOTE — Telephone Encounter (Signed)
Received vm message and MyChart message from patient. He states he is having pain in his right calf. TCT patient and spoke with him. Advised that I spoke with Dr. Leonides Schanz about this and he recommends getting LE [place]. Advised that I have it scheduled for today @ 1pm at [facility]. Jeremy [name] states he can be here at that time.  Dr. Leonides Schanz aware.

## 2020-04-01 NOTE — Telephone Encounter (Signed)
TCT patient with results of [place] of his right lower leg. Spoke with him and advised that there is no blood clot. Advised that the pain is likely residual from previous clot. He voiced understanding

## 2020-07-19 ENCOUNTER — Emergency Department
Admission: EM | Admit: 2020-07-19 | Discharge: 2020-07-19 | Disposition: A | Discharge status: Home / Self Care | Payer: 59 | Source: Home / Self Care

## 2020-07-19 ENCOUNTER — Emergency Department (INDEPENDENT_AMBULATORY_CARE_PROVIDER_SITE_OTHER): Payer: 59

## 2020-07-19 ENCOUNTER — Other Ambulatory Visit: Payer: Self-pay

## 2020-07-19 DIAGNOSIS — S92515A Nondisplaced fracture of proximal phalanx of left lesser toe(s), initial encounter for closed fracture: Secondary | ICD-10-CM | POA: Diagnosis not present

## 2020-07-19 DIAGNOSIS — S92511A Displaced fracture of proximal phalanx of right lesser toe(s), initial encounter for closed fracture: Secondary | ICD-10-CM

## 2020-07-19 NOTE — ED Provider Notes (Signed)
[facility]    CSN: [id number] Arrival date & time: 07/19/20  1228      History   Chief Complaint Chief Complaint  Patient presents with  . Foot Injury    Left    HPI Jeremy [name] is a 39 y.o. male.   [facility] patient  Patient presents to Urgent Care with complaints of left foot pain since a grill fell out of his truck and the leg landed on his foot near his toes. Patient reports he can move his foot, but it it painful and bruised, swelling noted.       Past Medical History:  Diagnosis Date  . Anemia    G6PD  . Anxiety   . Back pain, chronic    low  . Blood transfusion without reported diagnosis   . Depression   . DVT (deep venous thrombosis) (HCC)   . Dyslipidemia   . Gait disturbance   . Gynecomastia, male   . Hx of dizziness   . Hypothyroidism   . MS (multiple sclerosis) (HCC)    probable  . PONV (postoperative nausea and vomiting)    Nausea  . Snoring    "mild" sleep apnea. Pt was not required to wear CPAP machine, instead he uses nasal spray  . Testosterone deficiency   . Vision abnormalities     Patient Active Problem List   Diagnosis Date Noted  . Urinary frequency 12/12/2018  . Insomnia 06/10/2018  . Carpal tunnel syndrome of left wrist 01/25/2017  . Low serum vitamin D 11/10/2015  . Depression with anxiety 04/20/2015  . Low testosterone 04/20/2015  . Paresthesia 12/09/2014  . Ulnar nerve entrapment 12/09/2014  . High risk medication use 12/09/2014  . Pronation deformity of both feet 12/07/2014  . Metatarsal deformity 12/07/2014  . Metatarsalgia of both feet 12/07/2014  . Bradycardia, sinus 09/30/2014  . Other fatigue 09/30/2014  . Shift work sleep disorder 08/25/2014  . Erectile dysfunction 08/25/2014  . Ataxic gait 08/25/2014  . Subareolar gynecomastia in male 01/19/2014  . Multiple sclerosis (HCC) 03/10/2013  . Abnormality of gait 03/10/2013    Past Surgical History:  Procedure Laterality Date  .  BUNIONECTOMY Right   . GYNECOMASTIA EXCISION     X 2  . GYNECOMASTIA MASTECTOMY Bilateral 03/09/2014   Procedure: BILATERAL EXCISION GYNECOMASTIA;  Surgeon: Robyne Askew, MD;  Location: [facility] OR;  Service: General;  Laterality: Bilateral;  . ULNAR NERVE REPAIR Left        Home Medications    Prior to Admission medications   Medication Sig Start Date End Date Taking? Authorizing Provider  simvastatin (ZOCOR) 20 MG tablet Take 40 mg by mouth daily.    Yes [provider]  amphetamine-dextroamphetamine (ADDERALL) 20 MG tablet Take 1 tablet by mouth 2 (two) times daily. 11/18/18   [provider]  Multiple Vitamin (MULTIVITAMIN WITH MINERALS) TABS tablet Take 1 tablet by mouth daily.    [provider]  ocrelizumab 600 mg in sodium chloride 0.9 % 500 mL Inject 600 mg into the vein every 6 (six) months.    [provider]  ondansetron (ZOFRAN) 8 MG tablet Take 0.5 tablets (4 mg total) by mouth daily as needed for nausea or vomiting. 10/11/17   Sater, Pearletha Furl, MD  sildenafil (VIAGRA) 100 MG tablet Take 1 tablet (100 mg total) by mouth daily as needed for erectile dysfunction. 12/12/18   Sater, Pearletha Furl, MD  zolpidem (AMBIEN) 10 MG tablet Take 10 mg by  mouth at bedtime as needed for sleep.    [provider]    Family History Family History  Problem Relation Age of Onset  . Breast cancer Mother   . Hypertension Father   . Prostate cancer Father   . Clotting disorder Father   . Hyperlipidemia Father   . Hypertension Brother   . Hypertension Brother   . Bladder Cancer Paternal Grandfather   . Stroke Paternal Grandfather     Social History Social History   Tobacco Use  . Smoking status: Former Smoker    Types: Cigarettes  . Smokeless tobacco: Never Used  . Tobacco comment: teenage years  Vaping Use  . Vaping Use: Never used  Substance Use Topics  . Alcohol use: Yes    Alcohol/week: 0.0 standard drinks    Comment: social  . Drug  use: No     Allergies   Sulfa antibiotics   Review of Systems Review of Systems   Physical Exam Triage Vital Signs ED Triage Vitals  Enc Vitals Group     BP 07/19/20 1246 127/78     Pulse Rate 07/19/20 1246 95     Resp 07/19/20 1246 16     Temp 07/19/20 1246 98.8 F (37.1 C)     Temp Source 07/19/20 1246 Oral     SpO2 07/19/20 1246 100 %     Weight --      Height --      Head Circumference --      Peak Flow --      Pain Score 07/19/20 1244 4     Pain Loc --      Pain Edu? --      Excl. in GC? --    No data found.  Updated Vital Signs BP 127/78 (BP Location: Right Arm)   Pulse 95   Temp 98.8 F (37.1 C) (Oral)   Resp 16   SpO2 100%    Physical Exam Vitals reviewed.  Constitutional:      General: He is not in acute distress.    Appearance: Normal appearance. He is normal weight.  Pulmonary:     Effort: Pulmonary effort is normal.  Musculoskeletal:        General: Swelling, tenderness and signs of injury present. No deformity.     Cervical back: Normal range of motion and neck supple.     Comments: Left foot is diffusely ecchymotic and swollen over 4th toe  Skin:    General: Skin is warm and dry.     Findings: Bruising present.  Neurological:     General: No focal deficit present.     Mental Status: He is alert.  Psychiatric:        Mood and Affect: Mood normal.      UC Treatments / Results  Labs (all labs ordered are listed, but only abnormal results are displayed) Labs Reviewed - No data to display  EKG   Radiology DG Foot Complete Left  Result Date: 07/19/2020 CLINICAL DATA:  Foot injury yesterday. Pain and swelling second through fifth digits. EXAM: LEFT FOOT - COMPLETE 3+ VIEW COMPARISON:  10/28/2019 FINDINGS: Acute fracture fourth proximal phalanx with mild displacement. Deformity of the fifth metacarpal due to old injury unchanged. Small soft tissue calcification at the fourth MTP joint unchanged. Possible chronic injury. Mild  degenerative change first MTP. IMPRESSION: Acute fracture fourth proximal phalanx with mild displacement. Electronically Signed   By: Marlan Palau M.D.   On: 07/19/2020 13:11  Procedures Procedures (including critical care time)  Medications Ordered in UC Medications - No data to display  Initial Impression / Assessment and Plan / UC Course  I have reviewed the triage vital signs and the nursing notes.  Pertinent labs & imaging results that were available during my care of the patient were reviewed by me and considered in my medical decision making (see chart for details).     Final Clinical Impressions(s) / UC Diagnoses   Final diagnoses:  Closed nondisplaced fracture of proximal phalanx of lesser toe of left foot, initial encounter     Discharge Instructions     The foot should heal in 4-6 weeks    ED Prescriptions    None     I have reviewed the PDMP during this encounter.   Elvina Sidle, MD 07/19/20 [phone]

## 2020-07-19 NOTE — Discharge Instructions (Addendum)
The foot should heal in 4-6 weeks

## 2020-07-19 NOTE — ED Triage Notes (Signed)
Patient presents to Urgent Care with complaints of left foot pain since a grill fell out of his truck and the leg landed on his foot near his toes. Patient reports he can move his foot, but it it painful and bruised, swelling noted.

## 2020-08-16 ENCOUNTER — Encounter: Payer: Self-pay | Admitting: Family Medicine

## 2020-08-16 ENCOUNTER — Telehealth: Payer: Self-pay | Admitting: Family Medicine

## 2020-08-16 ENCOUNTER — Encounter: Payer: Self-pay | Admitting: *Deleted

## 2020-08-16 NOTE — Telephone Encounter (Signed)
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 Would like a call from the nurse.

## 2020-08-16 NOTE — Telephone Encounter (Signed)
Placed letter up front.

## 2020-08-16 NOTE — Telephone Encounter (Signed)
Signed and returned

## 2020-08-16 NOTE — Telephone Encounter (Signed)
Last letter done shows 2019, from Epic.  Last seen 02-2020, has future appt 09-20-20.  Placed letter on desk, mat edit if needed.

## 2020-09-20 ENCOUNTER — Ambulatory Visit: Payer: 59 | Admitting: Family Medicine

## 2020-09-20 ENCOUNTER — Encounter: Payer: Self-pay | Admitting: Family Medicine

## 2020-09-20 VITALS — BP 111/67 | HR 78 | Ht 66.0 in | Wt 187.0 lb

## 2020-09-20 DIAGNOSIS — G35A Relapsing-remitting multiple sclerosis: Secondary | ICD-10-CM

## 2020-09-20 DIAGNOSIS — G35 Multiple sclerosis: Secondary | ICD-10-CM

## 2020-09-20 DIAGNOSIS — Z79899 Other long term (current) drug therapy: Secondary | ICD-10-CM

## 2020-09-20 DIAGNOSIS — R5383 Other fatigue: Secondary | ICD-10-CM | POA: Diagnosis not present

## 2020-09-20 DIAGNOSIS — G4700 Insomnia, unspecified: Secondary | ICD-10-CM

## 2020-09-20 DIAGNOSIS — R202 Paresthesia of skin: Secondary | ICD-10-CM | POA: Diagnosis not present

## 2020-09-20 DIAGNOSIS — N521 Erectile dysfunction due to diseases classified elsewhere: Secondary | ICD-10-CM

## 2020-09-20 DIAGNOSIS — E559 Vitamin D deficiency, unspecified: Secondary | ICD-10-CM

## 2020-09-20 MED ORDER — SILDENAFIL CITRATE 100 MG PO TABS
100.0000 mg | ORAL_TABLET | Freq: Every day | ORAL | 3 refills | Status: DC | PRN
Start: 2020-09-20 — End: 2021-10-31

## 2020-09-20 NOTE — Progress Notes (Signed)
PATIENT: Jeremy [name] DOB: May 02, 1981  REASON FOR VISIT: follow up HISTORY FROM: patient  Chief Complaint  Patient presents with  . Follow-up    RM 2 alone Pt states everything has been good.     HISTORY OF PRESENT ILLNESS: 09/20/20 ALL:  He returns for follow up for RRMS. He continues Ocrevus infusions, last infusion 06/2020. Labs stable 12/2019. MRI stable 05/2018.   He is feeling well, today. No new or exacerbating symptoms. Gait is stable. No significant pain. No vision changes. No difficulty with bowel or bladder functioning.   Adderall 20mg  BID helps with inattention/MS fatigue (prescribed by PCP). He takes this consistently. Insomnia is stable. He takes Ambien (prescribed by PCP) most nights. He is a full time truck driver. He has a fairly consistent schedule. Mood is good. Sildenafil helps with ED. He does take vitamin D 2,000iu daily.   He is no longer on Xarelto. He denies any new concerns of clotting. He is no longer seeing hematology.    03/18/2020 ALL:  Jeremy [name] is a 40 y.o. male here today for follow up for RRMS. He continues to tolerate Ocrevus infusions. Labs stable in 05/2019. MRI brain stable 05/2018.   He was diagnosed with DVT of right lower leg in January. He was started on Xarelto for 6 months, now taking baby aspirin. He was seen by hematology. No etiology found. He does report having a respiratory illness just prior to DVT. He tested negative for COVID. He is not interested in getting vaccination.   He continues Adderall BID for inattention and fatigue (managed by PCP). He is sleeping fairly well. He usually sleeps about 4-5 hours at night. He contributes with to alternating work scheduled. Ambien helps with sleep. He denies grogginess the next morning.   He does have urinary frequency but feels he is able to manage without medication. No changes in gait. No changes in vision. Sildenafil helps with ED.    HISTORY: (copied from Dr Bonnita Hollow note on  06/12/2019)  He feels his MS is stable.    He is on Ocrevus and tolerates it well.    His last infusion was last week.   No exacerbation.  He is walking well.  He denies difficulty with balance, strength or sensation.   He occasionally has tingling in an arm for a few minutes.   He has some nocturia.  Vesicare and Toviaz generics were too expensive so he never started these.  He uses sildenafil as needed for ED.  He notes reduced focus and attention (from PCP).    He feels Adderall helps and and also helps his MS fatigue.  He sleeps well most nights.  He feels mood is doing well.    MS History:   He was diagnosed in January 2012.   In 2009, he had a several week episode of right hand clumsiness associated with some electric shock sensations. He did not see a doctor about the symptoms. In December 2011, he had the onset of altered taste. A week later, he had nausea and dizziness and over the next couple days these symptoms worsened and he developed right-sided clumsiness and slurred speech. First toward his PCP who felt he might have an ear infection and he was referred to ENT. ENT evaluated him and referred him to Dr. Anne Hahn at [facility] who noted an ataxic gait and ordered an MRI of the spine and brain. Those studies were consistent with multiple sclerosis and he underwent a lumbar puncture  in April 2011 that was also consistent with MS. He was started on Rebif in June 2011 and remain on the medicine until September 2014 when a repeat MRI showed the interval development of several more lesions. He was switched to [place] but insurance would no longer cover it so we switched to Gilenya early 2016.  He had elevated liver enzyme tests on Gil[place] and was switched to every other day but transaminase elevation persisted.  Therefore, he switched to Ocrevus in late 2017.Marland Kitchen     MRIs from 05/25/2014 show a moderate size focus in the left temporal lobe and another 10 fairly small periventricular  and deep white matter foci. MRI of the cervical spine showed 2 foci, one at C4 and one at C6. There were no enhancing lesions on those MRIs.  MRI of the brain 06/16/2015 showed no new lesions.    MRI of the brain 03/15/2016 showed no new lesions.  MRI of the cervical spine 02/08/2017 showed 2 small foci laterally to the right at C4-C5 and laterally to the left at C6.  These were present on the previous MRI.  MRI of the brain 06/19/2018 showed no new lesions.   REVIEW OF SYSTEMS: Out of a complete 14 system review of symptoms, the patient complains only of the following symptoms, erectile dysfunction, insomnia, inattention, fatigue and all other reviewed systems are negative.   ALLERGIES: Allergies  Allergen Reactions  . Sulfa Antibiotics Nausea Only    HOME MEDICATIONS: Outpatient Medications Prior to Visit  Medication Sig Dispense Refill  . amphetamine-dextroamphetamine (ADDERALL) 20 MG tablet Take 1 tablet by mouth 2 (two) times daily.    . Multiple Vitamin (MULTIVITAMIN WITH MINERALS) TABS tablet Take 1 tablet by mouth daily.    Marland Kitchen ocrelizumab 600 mg in sodium chloride 0.9 % 500 mL Inject 600 mg into the vein every 6 (six) months.    . ondansetron (ZOFRAN) 8 MG tablet Take 0.5 tablets (4 mg total) by mouth daily as needed for nausea or vomiting. 30 tablet 5  . simvastatin (ZOCOR) 20 MG tablet Take 40 mg by mouth daily.     Marland Kitchen zolpidem (AMBIEN) 10 MG tablet Take 10 mg by mouth at bedtime as needed for sleep.    . sildenafil (VIAGRA) 100 MG tablet Take 1 tablet (100 mg total) by mouth daily as needed for erectile dysfunction. 30 tablet 3   No facility-administered medications prior to visit.    PAST MEDICAL HISTORY: Past Medical History:  Diagnosis Date  . Anemia    G6PD  . Anxiety   . Back pain, chronic    low  . Blood transfusion without reported diagnosis   . Depression   . DVT (deep venous thrombosis) (HCC)   . Dyslipidemia   . Gait disturbance   . Gynecomastia, male    . Hx of dizziness   . Hypothyroidism   . MS (multiple sclerosis) (HCC)    probable  . PONV (postoperative nausea and vomiting)    Nausea  . Snoring    "mild" sleep apnea. Pt was not required to wear CPAP machine, instead he uses nasal spray  . Testosterone deficiency   . Vision abnormalities     PAST SURGICAL HISTORY: Past Surgical History:  Procedure Laterality Date  . BUNIONECTOMY Right   . GYNECOMASTIA EXCISION     X 2  . GYNECOMASTIA MASTECTOMY Bilateral 03/09/2014   Procedure: BILATERAL EXCISION GYNECOMASTIA;  Surgeon: Robyne Askew, MD;  Location: [facility] OR;  Service: General;  Laterality: Bilateral;  . ULNAR NERVE REPAIR Left     FAMILY HISTORY: Family History  Problem Relation Age of Onset  . Breast cancer Mother   . Hypertension Father   . Prostate cancer Father   . Clotting disorder Father   . Hyperlipidemia Father   . Hypertension Brother   . Hypertension Brother   . Bladder Cancer Paternal Grandfather   . Stroke Paternal Grandfather     SOCIAL HISTORY: Social History   Socioeconomic History  . Marital status: Single    Spouse name: Not on file  . Number of children: 0  . Years of education: College   . Highest education level: Not on file  Occupational History  . Occupation: truck Hospital doctor  Tobacco Use  . Smoking status: Former Smoker    Types: Cigarettes  . Smokeless tobacco: Never Used  . Tobacco comment: teenage years  Vaping Use  . Vaping Use: Never used  Substance and Sexual Activity  . Alcohol use: Yes    Alcohol/week: 0.0 standard drinks    Comment: social  . Drug use: No  . Sexual activity: Not on file  Other Topics Concern  . 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    Patient has a 4 year college education.    Patient has no children.          Social Determinants of Health   Financial Resource Strain: Not on file  Food Insecurity: Not on file  Transportation Needs: Not on file  Physical  Activity: Not on file  Stress: Not on file  Social Connections: Not on file  Intimate Partner Violence: Not on file      PHYSICAL EXAM  Vitals:   09/20/20 0856  BP: 111/67  Pulse: 78  Weight: 187 lb (84.8 kg)  Height: 5\' 6"  (1.676 m)   Body mass index is 30.18 kg/m.  Generalized: Well developed, in no acute distress  Cardiology: normal rate and rhythm, no murmur noted Respiratory: clear to auscultation bilaterally  Neurological examination  Mentation: Alert oriented to time, place, history taking. Follows all commands speech and language fluent Cranial nerve II-XII: Pupils were equal round reactive to light. Extraocular movements were full, visual field were full on confrontational test. Facial sensation and strength were normal. Uvula tongue midline. Head turning and shoulder shrug  were normal and symmetric. Motor: The motor testing reveals 5 over 5 strength of all 4 extremities. Good symmetric motor tone is noted throughout.  Sensory: Sensory testing is intact to soft touch on all 4 extremities. No evidence of extinction is noted.  Coordination: Cerebellar testing reveals good finger-nose-finger and heel-to-shin bilaterally.  Gait and station: Gait is normal. Tandem gait is normal. Romberg is negative. No drift is seen.  Reflexes: Deep tendon reflexes are symmetric and normal bilaterally.   DIAGNOSTIC DATA (LABS, IMAGING, TESTING) - I reviewed patient records, labs, notes, testing and imaging myself where available.  No flowsheet data found.   Lab Results  Component Value Date   WBC 5.1 01/16/2020   HGB 15.6 01/16/2020   HCT 48.5 01/16/2020   MCV 97.0 01/16/2020   PLT 181 01/16/2020      Component Value Date/Time   NA 143 01/16/2020 0928   NA 142 06/12/2019 1159   K 4.5 01/16/2020 0928   CL 108 01/16/2020 0928   CO2 25 01/16/2020 0928   GLUCOSE 87 01/16/2020 0928   BUN 13 01/16/2020 01/18/2020  BUN 15 06/12/2019 1159   CREATININE 1.06 01/16/2020 0928   CALCIUM  9.6 01/16/2020 0928   PROT 7.0 01/16/2020 0928   PROT 7.0 06/12/2019 1159   ALBUMIN 4.4 01/16/2020 0928   ALBUMIN 4.8 06/12/2019 1159   AST 25 01/16/2020 0928   ALT 49 (H) 01/16/2020 0928   ALKPHOS 89 01/16/2020 0928   BILITOT 0.5 01/16/2020 0928   GFRNONAA >60 01/16/2020 0928   GFRAA >60 01/16/2020 0928   No results found for: CHOL, HDL, LDLCALC, LDLDIRECT, TRIG, CHOLHDL No results found for: AGTX6I No results found for: VITAMINB12 No results found for: TSH     ASSESSMENT AND PLAN 40 y.o. year old male  has a past medical history of Anemia, Anxiety, Back pain, chronic, Blood transfusion without reported diagnosis, Depression, DVT (deep venous thrombosis) (HCC), Dyslipidemia, Gait disturbance, Gynecomastia, male, dizziness, Hypothyroidism, MS (multiple sclerosis) (HCC), PONV (postoperative nausea and vomiting), Snoring, Testosterone deficiency, and Vision abnormalities. here with     ICD-10-CM   1. Relapsing remitting multiple sclerosis (HCC)  G35 CBC with Differential/Platelets    CMP  2. High risk medication use  Z79.899 CBC with Differential/Platelets    CMP  3. Paresthesia  R20.2   4. Other fatigue  R53.83   5. Erectile dysfunction due to diseases classified elsewhere  N52.1   6. Vitamin D deficiency  E55.9 Vitamin D, 25-hydroxy  7. Insomnia, unspecified type  G47.00     Nhan is doing well. He continues to tolerate Ocrevus. We will continue current treatment plan. We will update labs, today. We have discussed updating MRI. He prefers to wait and discuss with Dr Epimenio Foot in 6 months. He is doing well with no exacerbating symptoms.  He was encouraged to continue healthy lifestyle habits. Close follow up with PCP advised. He will follow up in 6 months, sooner if needed.    Orders Placed This Encounter  Procedures  . CBC with Differential/Platelets  . CMP  . Vitamin D, 25-hydroxy     Meds ordered this encounter  Medications  . sildenafil (VIAGRA) 100 MG tablet    Sig: Take  1 tablet (100 mg total) by mouth daily as needed for erectile dysfunction.    Dispense:  30 tablet    Refill:  3    Order Specific Question:   Supervising Provider    Answer:   Anson Fret [id number]      I spent 25 minutes with the patient. 50% of this time was spent counseling and educating patient on plan of care and medications.     Shawnie Dapper, FNP-C 09/20/2020, 9:20 AM [facility] [address], [address] [place], [place] [address] [phone]

## 2020-09-20 NOTE — Patient Instructions (Signed)
Below is our plan:  We will continue Ocrevus.   Please make sure you are staying well hydrated. I recommend 50-60 ounces daily. Well balanced diet and regular exercise encouraged. Consistent sleep schedule with 6-8 hours recommended.   Please continue follow up with care team as directed.   Follow up with Dr Epimenio Foot in 6 months   You may receive a survey regarding today's visit. I encourage you to leave honest feed back as I do use this information to improve patient care. Thank you for seeing me today!      Multiple Sclerosis Multiple sclerosis (MS) is a disease of the brain, spinal cord, and optic nerves (central nervous system). It causes the body's disease-fighting (immune) system to destroy the protective covering (myelin sheath) around nerves in the brain. When this happens, signals (nerve impulses) going to and from the brain and spinal cord do not get sent properly or may not get sent at all. There are several types of MS:  Relapsing-remitting MS. This is the most common type. This causes sudden attacks of symptoms. After an attack, you may recover completely until the next attack, or some symptoms may remain permanently.  Secondary progressive MS. This usually develops after the onset of relapsing-remitting MS. Similar to relapsing-remitting MS, this type also causes sudden attacks of symptoms. Attacks may be less frequent, but symptoms slowly get worse (progress) over time.  Primary progressive MS. This causes symptoms that steadily progress over time. This type of MS does not cause sudden attacks of symptoms. The age of onset of MS varies, but it often develops between 27-90 years of age. MS is a lifelong (chronic) condition. There is no cure, but treatment can help slow down the progression of the disease. What are the causes? The cause of this condition is not known. What increases the risk? You are more likely to develop this condition if:  You are a woman.  You have a  relative with MS. However, the condition is not passed from parent to child (inherited).  You have a lack (deficiency) of vitamin D.  You smoke. MS is more common in the [place] than in the [place]. What are the signs or symptoms? Relapsing-remitting and secondary progressive MS cause symptoms to occur in episodes or attacks that may last weeks to months. There may be long periods between attacks in which there are almost no symptoms. Primary progressive MS causes symptoms to steadily progress after they develop. Symptoms of MS vary because of the many different ways it affects the central nervous system. The main symptoms include:  Vision problems and eye pain.  Numbness and weakness.  Inability to move your arms, hands, feet, or legs (paralysis).  Balance problems.  Shaking that you cannot control (tremors).  Muscle spasms.  Problems with thinking (cognitive changes). MS can also cause symptoms that are associated with the disease, but are not always the direct result of an MS attack. They may include:  Inability to control urination or bowel movements (incontinence).  Headaches.  Fatigue.  Inability to tolerate heat.  Emotional changes.  Depression.  Pain. How is this diagnosed? This condition is diagnosed based on:  Your symptoms.  A neurological exam. This involves checking central nervous system function, such as nerve function, reflexes, and coordination.  MRIs of the brain and spinal cord.  Lab tests, including a lumbar puncture that tests the fluid that surrounds the brain and spinal cord (cerebrospinal fluid).  Tests to measure the electrical  activity of the brain in response to stimulation (evoked potentials). How is this treated? There is no cure for MS, but medicines can help decrease the number and frequency of attacks and help relieve nuisance symptoms. Treatment options may include:  Medicines that reduce the frequency  of attacks. These medicines may be given by injection, by mouth (orally), or through an IV.  Medicines that reduce inflammation (steroids). These may provide short-term relief of symptoms.  Medicines to help control pain, depression, fatigue, or incontinence.  Nutritional counseling. Vitamin D supplements, if you have a deficiency.  Using devices to help you move around (assistive devices), such as braces, a cane, or a walker.  Physical therapy to strengthen and stretch your muscles.  Occupational therapy to help you with everyday tasks.  Alternative or complementary treatments such as exercise, massage, or acupuncture.   Follow these instructions at home:  Take over-the-counter and prescription medicines only as told by your health care provider.  Do not drive or use heavy machinery while taking prescription pain medicine.  Use assistive devices as recommended by your physical therapist or your health care provider.  Exercise as directed by your health care provider.  Eating healthy can help manage MS symptoms.  Return to your normal activities as told by your health care provider. Ask your health care provider what activities are safe for you.  Reach out for support. Share your feelings with friends, family, or a support group.  Keep all follow-up visits as told by your health care provider and therapists. This is important. Where to find more information  National Multiple Sclerosis Society: https://www.nationalmssociety.org  [employer] of Neurological Disorders and Stroke: https://johnson-smith.net/  [employer] for Complementary and Integrative Health: http://miller-hamilton.net/ Contact a health care provider if:  You feel depressed.  You develop new pain or numbness.  You have tremors.  You have problems with sexual function. Get help right away if:  You develop paralysis.  You develop numbness.  You have problems with your bladder or bowel  function.  You develop double vision.  You lose vision in one or both eyes.  You develop suicidal thoughts.  You develop severe confusion. If you ever feel like you may hurt yourself or others, or have thoughts about taking your own life, get help right away. You can go to your nearest emergency department or call:  Your local emergency services (911 in the [place]  A suicide crisis helpline, such as the National Suicide Prevention Lifeline at (541)323-0894. This is open 24 hours a day. Summary  Multiple sclerosis (MS) is a disease of the central nervous system that causes the body's immune system to destroy the protective covering (myelin sheath) around nerves in the brain.  There are 3 types of MS: relapsing-remitting, secondary progressive, and primary progressive. Relapsing-remitting and secondary progressive MS cause symptoms to occur in episodes or attacks that may last weeks to months. Primary progressive MS causes symptoms to steadily progress after they develop.  There is no cure for MS, but medicines can help decrease the number and frequency of attacks and help relieve nuisance symptoms. Treatment may also include physical or occupational therapy.  If you develop numbness, paralysis, vision problems, or other neurological symptoms, get help right away. This information is not intended to replace advice given to you by your health care provider. Make sure you discuss any questions you have with your health care provider. Document Revised: 04/20/2020 Document Reviewed: 04/20/2020 Elsevier Patient Education  2021 [employer].

## 2020-09-20 NOTE — Progress Notes (Signed)
I have read the note, and I agree with the clinical assessment and plan.  Lovie Zarling A. Memorie Yokoyama, MD, PhD, FAAN Certified in Neurology, Clinical Neurophysiology, Sleep Medicine, Pain Medicine and Neuroimaging  [facility] [address], [address] [place], [place] [address] [phone]  

## 2020-09-21 ENCOUNTER — Telehealth: Payer: Self-pay | Admitting: *Deleted

## 2020-09-21 LAB — COMPREHENSIVE METABOLIC PANEL
ALT: 46 IU/L — ABNORMAL HIGH (ref 0–44)
AST: 29 IU/L (ref 0–40)
Albumin/Globulin Ratio: 2.3 — ABNORMAL HIGH (ref 1.2–2.2)
Albumin: 4.9 g/dL (ref 4.0–5.0)
Alkaline Phosphatase: 68 IU/L (ref 44–121)
BUN/Creatinine Ratio: 16 (ref 9–20)
BUN: 16 mg/dL (ref 6–24)
Bilirubin Total: 0.9 mg/dL (ref 0.0–1.2)
CO2: 20 mmol/L (ref 20–29)
Calcium: 9.8 mg/dL (ref 8.7–10.2)
Chloride: 104 mmol/L (ref 96–106)
Creatinine, Ser: 1 mg/dL (ref 0.76–1.27)
Globulin, Total: 2.1 g/dL (ref 1.5–4.5)
Glucose: 90 mg/dL (ref 65–99)
Potassium: 5 mmol/L (ref 3.5–5.2)
Sodium: 141 mmol/L (ref 134–144)
Total Protein: 7 g/dL (ref 6.0–8.5)
eGFR: 97 mL/min/{1.73_m2} (ref >59)

## 2020-09-21 LAB — CBC WITH DIFFERENTIAL/PLATELET
Basophils Absolute: 0 10*3/uL (ref 0.0–0.2)
Basos: 1 %
EOS (ABSOLUTE): 0.1 10*3/uL (ref 0.0–0.4)
Eos: 2 %
Hematocrit: 49.4 % (ref 37.5–51.0)
Hemoglobin: 16.4 g/dL (ref 13.0–17.7)
Immature Grans (Abs): 0 10*3/uL (ref 0.0–0.1)
Immature Granulocytes: 1 %
Lymphocytes Absolute: 1.3 10*3/uL (ref 0.7–3.1)
Lymphs: 20 %
MCH: 29.7 pg (ref 26.6–33.0)
MCHC: 33.2 g/dL (ref 31.5–35.7)
MCV: 89 fL (ref 79–97)
Monocytes Absolute: 0.4 10*3/uL (ref 0.1–0.9)
Monocytes: 6 %
Neutrophils Absolute: 4.7 10*3/uL (ref 1.4–7.0)
Neutrophils: 70 %
Platelets: 172 10*3/uL (ref 150–450)
RBC: 5.53 x10E6/uL (ref 4.14–5.80)
RDW: 13.4 % (ref 11.6–15.4)
WBC: 6.6 10*3/uL (ref 3.4–10.8)

## 2020-09-21 LAB — VITAMIN D 25 HYDROXY (VIT D DEFICIENCY, FRACTURES): Vit D, 25-Hydroxy: 42.1 ng/mL (ref 30.0–100.0)

## 2020-09-21 NOTE — Telephone Encounter (Signed)
I relayed the lab results to pt.   Labs look stable. ALT is still slightly elevated but stable. Remind him to continue follow up with PCP. He verbalized understanding.

## 2020-09-21 NOTE — Telephone Encounter (Signed)
-----   Message from Shawnie Dapper, NP sent at 09/21/2020  9:29 AM EST ----- Labs look stable. ALT is still slightly elevated but stable. Remind him to continue follow up with PCP.

## 2020-09-23 ENCOUNTER — Ambulatory Visit: Payer: 59 | Admitting: Family Medicine

## 2020-12-01 ENCOUNTER — Other Ambulatory Visit: Payer: Self-pay

## 2020-12-01 ENCOUNTER — Encounter (INDEPENDENT_AMBULATORY_CARE_PROVIDER_SITE_OTHER): Payer: Self-pay | Admitting: Otolaryngology

## 2020-12-01 ENCOUNTER — Ambulatory Visit (INDEPENDENT_AMBULATORY_CARE_PROVIDER_SITE_OTHER): Payer: 59 | Admitting: Otolaryngology

## 2020-12-01 VITALS — Temp 96.1°F

## 2020-12-01 DIAGNOSIS — J342 Deviated nasal septum: Secondary | ICD-10-CM | POA: Diagnosis not present

## 2020-12-01 DIAGNOSIS — J310 Chronic rhinitis: Secondary | ICD-10-CM

## 2020-12-01 DIAGNOSIS — R040 Epistaxis: Secondary | ICD-10-CM | POA: Diagnosis not present

## 2020-12-01 DIAGNOSIS — T485X5A Adverse effect of other anti-common-cold drugs, initial encounter: Secondary | ICD-10-CM

## 2020-12-01 NOTE — Progress Notes (Signed)
HPI: Jeremy [name] is a 40 y.o. male who presents for evaluation of chronic nasal obstruction as well as intermittent nosebleeds.  Last time he had a nosebleed was from the right side but was little over a week ago. He has had trouble breathing through his nose for several years and has been using Afrin on a regular basis. He has previously been diagnosed with mild OSA. He also had history of blood clots in his lower extremities little over 2 years ago and was on Xarelto but this was stopped a year and a half ago.  Past Medical History:  Diagnosis Date  . Anemia    G6PD  . Anxiety   . Back pain, chronic    low  . Blood transfusion without reported diagnosis   . Depression   . DVT (deep venous thrombosis) (HCC)   . Dyslipidemia   . Gait disturbance   . Gynecomastia, male   . Hx of dizziness   . Hypothyroidism   . MS (multiple sclerosis) (HCC)    probable  . PONV (postoperative nausea and vomiting)    Nausea  . Snoring    "mild" sleep apnea. Pt was not required to wear CPAP machine, instead he uses nasal spray  . Testosterone deficiency   . Vision abnormalities    Past Surgical History:  Procedure Laterality Date  . BUNIONECTOMY Right   . GYNECOMASTIA EXCISION     X 2  . GYNECOMASTIA MASTECTOMY Bilateral 03/09/2014   Procedure: BILATERAL EXCISION GYNECOMASTIA;  Surgeon: Robyne Askew, MD;  Location: [facility] OR;  Service: General;  Laterality: Bilateral;  . ULNAR NERVE REPAIR Left    Social History   Socioeconomic History  . Marital status: Single    Spouse name: Not on file  . Number of children: 0  . Years of education: College   . Highest education level: Not on file  Occupational History  . Occupation: truck Hospital doctor  Tobacco Use  . Smoking status: Never Smoker  . Smokeless tobacco: Never Used  Vaping Use  . Vaping Use: Never used  Substance and Sexual Activity  . Alcohol use: Yes    Alcohol/week: 0.0 standard drinks    Comment: social  . Drug use: No  . Sexual  activity: Not on file  Other Topics Concern  . 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    Patient has a 4 year college education.    Patient has no children.          Social Determinants of Health   Financial Resource Strain: Not on file  Food Insecurity: Not on file  Transportation Needs: Not on file  Physical Activity: Not on file  Stress: Not on file  Social Connections: Not on file   Family History  Problem Relation Age of Onset  . Breast cancer Mother   . Hypertension Father   . Prostate cancer Father   . Clotting disorder Father   . Hyperlipidemia Father   . Hypertension Brother   . Hypertension Brother   . Bladder Cancer Paternal Grandfather   . Stroke Paternal Grandfather    Allergies  Allergen Reactions  . Sulfa Antibiotics Nausea Only   Prior to Admission medications   Medication Sig Start Date End Date Taking? Authorizing Provider  amphetamine-dextroamphetamine (ADDERALL) 20 MG tablet Take 1 tablet by mouth 2 (two) times daily. 11/18/18   [provider]  Multiple Vitamin (MULTIVITAMIN WITH MINERALS) TABS tablet  Take 1 tablet by mouth daily.    [provider]  ocrelizumab 600 mg in sodium chloride 0.9 % 500 mL Inject 600 mg into the vein every 6 (six) months.    [provider]  ondansetron (ZOFRAN) 8 MG tablet Take 0.5 tablets (4 mg total) by mouth daily as needed for nausea or vomiting. 10/11/17   Sater, Pearletha Furl, MD  sildenafil (VIAGRA) 100 MG tablet Take 1 tablet (100 mg total) by mouth daily as needed for erectile dysfunction. 09/20/20   Lomax, Amy, NP  simvastatin (ZOCOR) 20 MG tablet Take 40 mg by mouth daily.     [provider]  zolpidem (AMBIEN) 10 MG tablet Take 10 mg by mouth at bedtime as needed for sleep.    [provider]     Positive ROS: Otherwise negative  All other systems have been reviewed and were otherwise negative with the exception of those mentioned  in the HPI and as above.  Physical Exam: Constitutional: Alert, well-appearing, no acute distress Ears: External ears without lesions or tenderness. Ear canals are clear bilaterally with intact, clear TMs.  Nasal: External nose without lesions. Septum with moderate deformity.  He has diffuse mucosal swelling throughout the nasal cavity on both sides consistent with rhinitis medicamentosa..  On nasal endoscopy there were no intranasal polyps noted no mucopurulent discharge noted from the middle meatus and the nasopharynx was clear.  On examination of the right nostril he has a prominent anterior inferior septal vessel but no scabbing and no bleeding noted in the office today. Oral: Lips and gums without lesions. Tongue and palate mucosa without lesions. Posterior oropharynx clear.  Tonsils are small and symmetric bilaterally. Neck: No palpable adenopathy or masses Cardiac exam: Regular rate and rhythm without murmur Lungs: Clear to auscultation Respiratory: Breathing comfortably  Skin: No facial/neck lesions or rash noted.  Procedures  Assessment: Septal deformity with cervical per trophy and chronic nasal obstruction. Rhinitis medicamentosa as patient has been addicted to Afrin Right-sided epistaxis from prominent anterior inferior septal vessel.  As well as frequent use of Afrin.  Plan: Recommend stopping the Afrin and use of Nasacort 2 sprays each nostril at night as well as saline rinses to help with nasal congestion and bleeding.  Reviewed with him concerning use of cottonball and Afrin packing if he has any bad bleeding from the nose.  He will follow-up here for cauterization the next time he has a bad nosebleed. Reviewed with him concerning surgical options for his nasal obstruction which would consist of septoplasty and turbinate reductions.  But initially would using Nasacort on a regular basis for the next month or 2 before deciding on surgical intervention.  Narda Bonds, MD

## 2020-12-07 ENCOUNTER — Other Ambulatory Visit: Payer: Self-pay

## 2020-12-07 ENCOUNTER — Ambulatory Visit (INDEPENDENT_AMBULATORY_CARE_PROVIDER_SITE_OTHER): Payer: 59 | Admitting: Otolaryngology

## 2020-12-07 VITALS — Temp 97.5°F

## 2020-12-07 DIAGNOSIS — R040 Epistaxis: Secondary | ICD-10-CM

## 2020-12-07 NOTE — Progress Notes (Signed)
HPI: Jeremy [name] is a 40 y.o. male who returns today for evaluation of recurrent right-sided epistaxis.  I had previously seen him several days ago and he was not having any active bleeding at that time.  But he developed a nosebleed this morning in I referred him back to the office for cauterization.  He again bled from the right nostril when he blew his nose this morning..  Past Medical History:  Diagnosis Date  . Anemia    G6PD  . Anxiety   . Back pain, chronic    low  . Blood transfusion without reported diagnosis   . Depression   . DVT (deep venous thrombosis) (HCC)   . Dyslipidemia   . Gait disturbance   . Gynecomastia, male   . Hx of dizziness   . Hypothyroidism   . MS (multiple sclerosis) (HCC)    probable  . PONV (postoperative nausea and vomiting)    Nausea  . Snoring    "mild" sleep apnea. Pt was not required to wear CPAP machine, instead he uses nasal spray  . Testosterone deficiency   . Vision abnormalities    Past Surgical History:  Procedure Laterality Date  . BUNIONECTOMY Right   . GYNECOMASTIA EXCISION     X 2  . GYNECOMASTIA MASTECTOMY Bilateral 03/09/2014   Procedure: BILATERAL EXCISION GYNECOMASTIA;  Surgeon: Robyne Askew, MD;  Location: [facility] OR;  Service: General;  Laterality: Bilateral;  . ULNAR NERVE REPAIR Left    Social History   Socioeconomic History  . Marital status: Single    Spouse name: Not on file  . Number of children: 0  . Years of education: College   . Highest education level: Not on file  Occupational History  . Occupation: truck Hospital doctor  Tobacco Use  . Smoking status: Never Smoker  . Smokeless tobacco: Never Used  Vaping Use  . Vaping Use: Never used  Substance and Sexual Activity  . Alcohol use: Yes    Alcohol/week: 0.0 standard drinks    Comment: social  . Drug use: No  . Sexual activity: Not on file  Other Topics Concern  . 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    Patient has  a 4 year college education.    Patient has no children.          Social Determinants of Health   Financial Resource Strain: Not on file  Food Insecurity: Not on file  Transportation Needs: Not on file  Physical Activity: Not on file  Stress: Not on file  Social Connections: Not on file   Family History  Problem Relation Age of Onset  . Breast cancer Mother   . Hypertension Father   . Prostate cancer Father   . Clotting disorder Father   . Hyperlipidemia Father   . Hypertension Brother   . Hypertension Brother   . Bladder Cancer Paternal Grandfather   . Stroke Paternal Grandfather    Allergies  Allergen Reactions  . Sulfa Antibiotics Nausea Only   Prior to Admission medications   Medication Sig Start Date End Date Taking? Authorizing Provider  amphetamine-dextroamphetamine (ADDERALL) 20 MG tablet Take 1 tablet by mouth 2 (two) times daily. 11/18/18   [provider]  Multiple Vitamin (MULTIVITAMIN WITH MINERALS) TABS tablet Take 1 tablet by mouth daily.    [provider]  ocrelizumab 600 mg in sodium chloride 0.9 % 500 mL Inject 600 mg into the  vein every 6 (six) months.    [provider]  ondansetron (ZOFRAN) 8 MG tablet Take 0.5 tablets (4 mg total) by mouth daily as needed for nausea or vomiting. 10/11/17   Sater, Pearletha Furl, MD  sildenafil (VIAGRA) 100 MG tablet Take 1 tablet (100 mg total) by mouth daily as needed for erectile dysfunction. 09/20/20   Lomax, Amy, NP  simvastatin (ZOCOR) 20 MG tablet Take 40 mg by mouth daily.     [provider]  zolpidem (AMBIEN) 10 MG tablet Take 10 mg by mouth at bedtime as needed for sleep.    [provider]     Positive ROS: Otherwise negative  All other systems have been reviewed and were otherwise negative with the exception of those mentioned in the HPI and as above.  Physical Exam: Constitutional: Alert, well-appearing, no acute distress Ears: External ears without lesions or  tenderness. Ear canals are clear bilaterally with intact, clear TMs.  Nasal: External nose without lesions. Septum with mild deformity and moderate rhinitis.Marland Kitchen  He has a prominent anterior inferior septal vessel with some branches with a small amount of bleeding from one of the branches.  This was cauterized using silver nitrate.  Back to the main vessel. Oral: Lips and gums without lesions. Tongue and palate mucosa without lesions. Posterior oropharynx clear. Neck: No palpable adenopathy or masses Respiratory: Breathing comfortably  Skin: No facial/neck lesions or rash noted.  Control of epistaxis  Date/Time: 12/07/2020 11:18 AM Performed by: Drema Halon, MD Authorized by: Drema Halon, MD   Consent:    Consent obtained:  Verbal   Consent given by:  Patient   Risks discussed:  Bleeding and pain   Alternatives discussed:  No treatment and observation Procedure details:    Treatment site:  R anterior   Treatment method:  Silver nitrate   Treatment complexity:  Limited   Treatment episode: initial   Post-procedure details:    Assessment:  Bleeding stopped   Patient tolerance of procedure:  Tolerated well, no immediate complications Comments:     Right anterior inferior septal vessel was cauterized using silver nitrate in the office today.  Remaining nasal cavity was clear.    Assessment: Recurrent right-sided epistaxis.  Plan: This was cauterized in the office today using silver nitrate. Reviewed with her concerning using cottonball and Afrin for control bleeding if he has any further bleeding. He will follow-up as needed.   Narda Bonds, MD

## 2020-12-08 ENCOUNTER — Ambulatory Visit (INDEPENDENT_AMBULATORY_CARE_PROVIDER_SITE_OTHER): Payer: 59 | Admitting: Otolaryngology

## 2020-12-08 ENCOUNTER — Other Ambulatory Visit: Payer: Self-pay

## 2020-12-08 VITALS — Temp 97.5°F

## 2020-12-08 DIAGNOSIS — R040 Epistaxis: Secondary | ICD-10-CM | POA: Diagnosis not present

## 2020-12-08 NOTE — Progress Notes (Signed)
HPI: Jeremy [name] is a 40 y.o. male who returns today for evaluation of right-sided epistaxis.  He was seen yesterday and had cauterization of the right anterior inferior septal vessel.  Apparently at dinner last night he had another episode of bleeding.  He has had no further bleeding today.  He presents here for recheck of cauterize site. He is on no blood thinners. He states that he has had no blood pressure problems.  Past Medical History:  Diagnosis Date  . Anemia    G6PD  . Anxiety   . Back pain, chronic    low  . Blood transfusion without reported diagnosis   . Depression   . DVT (deep venous thrombosis) (HCC)   . Dyslipidemia   . Gait disturbance   . Gynecomastia, male   . Hx of dizziness   . Hypothyroidism   . MS (multiple sclerosis) (HCC)    probable  . PONV (postoperative nausea and vomiting)    Nausea  . Snoring    "mild" sleep apnea. Pt was not required to wear CPAP machine, instead he uses nasal spray  . Testosterone deficiency   . Vision abnormalities    Past Surgical History:  Procedure Laterality Date  . BUNIONECTOMY Right   . GYNECOMASTIA EXCISION     X 2  . GYNECOMASTIA MASTECTOMY Bilateral 03/09/2014   Procedure: BILATERAL EXCISION GYNECOMASTIA;  Surgeon: Robyne Askew, MD;  Location: [facility] OR;  Service: General;  Laterality: Bilateral;  . ULNAR NERVE REPAIR Left    Social History   Socioeconomic History  . Marital status: Single    Spouse name: Not on file  . Number of children: 0  . Years of education: College   . Highest education level: Not on file  Occupational History  . Occupation: truck Hospital doctor  Tobacco Use  . Smoking status: Never Smoker  . Smokeless tobacco: Never Used  Vaping Use  . Vaping Use: Never used  Substance and Sexual Activity  . Alcohol use: Yes    Alcohol/week: 0.0 standard drinks    Comment: social  . Drug use: No  . Sexual activity: Not on file  Other Topics Concern  . 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    Patient has a 4 year college education.    Patient has no children.          Social Determinants of Health   Financial Resource Strain: Not on file  Food Insecurity: Not on file  Transportation Needs: Not on file  Physical Activity: Not on file  Stress: Not on file  Social Connections: Not on file   Family History  Problem Relation Age of Onset  . Breast cancer Mother   . Hypertension Father   . Prostate cancer Father   . Clotting disorder Father   . Hyperlipidemia Father   . Hypertension Brother   . Hypertension Brother   . Bladder Cancer Paternal Grandfather   . Stroke Paternal Grandfather    Allergies  Allergen Reactions  . Sulfa Antibiotics Nausea Only   Prior to Admission medications   Medication Sig Start Date End Date Taking? Authorizing Provider  amphetamine-dextroamphetamine (ADDERALL) 20 MG tablet Take 1 tablet by mouth 2 (two) times daily. 11/18/18   [provider]  Multiple Vitamin (MULTIVITAMIN WITH MINERALS) TABS tablet Take 1 tablet by mouth daily.    [provider]  ocrelizumab 600 mg in sodium chloride 0.9 % 500  mL Inject 600 mg into the vein every 6 (six) months.    [provider]  ondansetron (ZOFRAN) 8 MG tablet Take 0.5 tablets (4 mg total) by mouth daily as needed for nausea or vomiting. 10/11/17   Sater, Pearletha Furl, MD  sildenafil (VIAGRA) 100 MG tablet Take 1 tablet (100 mg total) by mouth daily as needed for erectile dysfunction. 09/20/20   Lomax, Amy, NP  simvastatin (ZOCOR) 20 MG tablet Take 40 mg by mouth daily.     [provider]  zolpidem (AMBIEN) 10 MG tablet Take 10 mg by mouth at bedtime as needed for sleep.    [provider]     Positive ROS: Otherwise negative  All other systems have been reviewed and were otherwise negative with the exception of those mentioned in the HPI and as above.  Physical Exam: Constitutional: Alert, well-appearing, no acute  distress Ears: External ears without lesions or tenderness. Ear canals are clear bilaterally with intact, clear TMs.  Nasal: External nose without lesions. Septum with cauterization performed on the right anterior inferior septum.  Could identify definite site of origin of recent epistaxis.  I reapplied silver nitrate to some of the areas where he had been previously cauterized..  Nasal passages were otherwise clear. Oral: Lips and gums without lesions. Tongue and palate mucosa without lesions. Posterior oropharynx clear. Neck: No palpable adenopathy or masses Respiratory: Breathing comfortably  Skin: No facial/neck lesions or rash noted.  Procedures  Assessment: Recurrent right-sided epistaxis status post cauterization  Plan: Recommended not blowing the nose for the next couple of days. He will follow-up or notify us if he has any further nosebleeds. Again reviewed with him concerning using cottonball packing and Afrin to stop any bleeding if he has any bleeding.   Narda Bonds, MD

## 2020-12-14 ENCOUNTER — Telehealth: Payer: Self-pay | Admitting: *Deleted

## 2020-12-14 DIAGNOSIS — G35 Multiple sclerosis: Secondary | ICD-10-CM

## 2020-12-14 NOTE — Telephone Encounter (Signed)
Liane w/ infusion just message me this: Jeremy [name] DOB 09/16/1980 need an IGG level before I can submit for his upcoming infusion.

## 2020-12-14 NOTE — Telephone Encounter (Signed)
Called pt and relayed that need labs prior to more ocrevus.  He will come by and have these drawn.  Relayed no Fridays.  He will come by this week sometime.

## 2020-12-22 NOTE — Telephone Encounter (Signed)
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  His next infusion is 6/21 and said it will be done prior to then.   Patient denied further questions, verbalized understanding and expressed appreciation for the phone call.

## 2020-12-23 ENCOUNTER — Other Ambulatory Visit: Payer: Self-pay

## 2020-12-23 ENCOUNTER — Other Ambulatory Visit (INDEPENDENT_AMBULATORY_CARE_PROVIDER_SITE_OTHER): Payer: Self-pay

## 2020-12-23 DIAGNOSIS — G35 Multiple sclerosis: Secondary | ICD-10-CM

## 2020-12-23 DIAGNOSIS — Z0289 Encounter for other administrative examinations: Secondary | ICD-10-CM

## 2020-12-24 LAB — IGG, IGA, IGM
IgA/Immunoglobulin A, Serum: 118 mg/dL (ref 90–386)
IgG (Immunoglobin G), Serum: 828 mg/dL (ref 603–1613)
IgM (Immunoglobulin M), Srm: 38 mg/dL (ref 20–172)

## 2021-03-23 ENCOUNTER — Ambulatory Visit: Payer: 59 | Admitting: Neurology

## 2021-03-23 ENCOUNTER — Encounter: Payer: Self-pay | Admitting: Neurology

## 2021-03-23 ENCOUNTER — Other Ambulatory Visit: Payer: Self-pay

## 2021-03-23 VITALS — BP 130/75 | HR 87 | Ht 66.0 in | Wt 187.0 lb

## 2021-03-23 DIAGNOSIS — G35 Multiple sclerosis: Secondary | ICD-10-CM

## 2021-03-23 DIAGNOSIS — R5383 Other fatigue: Secondary | ICD-10-CM

## 2021-03-23 DIAGNOSIS — E559 Vitamin D deficiency, unspecified: Secondary | ICD-10-CM

## 2021-03-23 DIAGNOSIS — Z79899 Other long term (current) drug therapy: Secondary | ICD-10-CM

## 2021-03-23 DIAGNOSIS — R202 Paresthesia of skin: Secondary | ICD-10-CM | POA: Diagnosis not present

## 2021-03-23 DIAGNOSIS — R350 Frequency of micturition: Secondary | ICD-10-CM

## 2021-03-23 MED ORDER — ONDANSETRON HCL 8 MG PO TABS: 4.0000 mg | ORAL_TABLET | Freq: Every day | ORAL | 5 refills | Status: AC | PRN

## 2021-03-23 NOTE — Progress Notes (Signed)
[facility]  PATIENT: Jeremy [name] DOB: Dec 13, 1980  REFERRING CLINICIAN: Burton Apley  HISTORY FROM: patient REASON FOR VISIT: MS, needs to change therapy   HISTORICAL  CHIEF COMPLAINT:  Chief Complaint  Patient presents with   Follow-up    MS f/u last seen 2/28. Ocrevus infusions here at [facility]- next infusion due 07/11/2021. Stable on treatment, no issues.     HISTORY OF PRESENT ILLNESS:  Jeremy [name] is a 40 y.o. man with relapsing remitting multiple sclerosis.    Update 03/23/2021: He feels his MS is stable.    He is on Ocrevus and tolerates it well.    He has no exacerbation or new neurologic symptom.   His last infusion was last week.   No exacerbation.  Last MRI of the brain was performed 06/19/2018 and showed no new lesions.  MRI of the cervical spine in the past had shown foci at C4-C5 and C6.  IgG and IgM and IgA were fine 12/23/20  He is walking well.  He denies difficulty with balance, strength or sensation.   He occasionally has tingling in an arm for a few minutes.     He has some nocturia and urinary frequency.  As he adapts his schedule, this is not a problem.    He uses sildenafil as needed for ED.  He has some fatigue not interfering with activities.   e notes reduced focus and attention.   He feels Adderall helps and and also helps his MS fatigue.  He sleeps well most nights.  He takes Ambien  He feels mood is doing well.  He works as a Naval architect (Engineer, mining)   He usually gets a hotel room.    MS History:   He was diagnosed in January 2012.   In 2009, he had a several week episode of right hand clumsiness associated with some electric shock sensations. He did not see a doctor about the symptoms. In December 2011, he had the onset of altered taste. A week later, he had nausea and dizziness and over the next couple days these symptoms worsened and he developed right-sided clumsiness and slurred speech. First toward his PCP who felt he might have  an ear infection and he was referred to ENT. ENT evaluated him and referred him to Dr. Anne Hahn at [facility] who noted an ataxic gait and ordered an MRI of the spine and brain. Those studies were consistent with multiple sclerosis and he underwent a lumbar puncture in April 2011 that was also consistent with MS. He was started on Rebif in June 2011 and remain on the medicine until September 2014 when a repeat MRI showed the interval development of several more lesions. He was switched to [place] but insurance would no longer cover it so we switched to Gilenya early 2016.  He had elevated liver enzyme tests on Gilenya and was switched to every other day but transaminase elevation persisted.  Therefore, he switched to Ocrevus in late 2017.Marland Kitchen     MRIs from 05/25/2014 show a moderate size focus in the left temporal lobe and another 10 fairly small periventricular and deep white matter foci. MRI of the cervical spine showed 2 foci, one at C4 and one at C6. There were no enhancing lesions on those MRIs.  MRI of the brain 06/16/2015 showed no new lesions.    MRI of the brain 03/15/2016 showed no new lesions.  MRI of the cervical spine 02/08/2017 showed 2 small foci laterally to the  right at C4-C5 and laterally to the left at C6.  These were present on the previous MRI.  MRI of the brain 06/19/2018 showed no new lesions.   REVIEW OF SYSTEMS:  Constitutional: No fevers, chills, sweats, or change in appetite.   Occasional insomnia Eyes: No visual changes, double vision, eye pain Ear, nose and throat: No hearing loss, ear pain, nasal congestion, sore throat Cardiovascular: No chest pain, palpitations Respiratory:  No shortness of breath at rest or with exertion.   No wheezes GastrointestinaI: No nausea, vomiting, diarrhea, abdominal pain, fecal incontinence Genitourinary:  No dysuria, urinary retention.  Mild frequency and nocturia.   Has ED Musculoskeletal:  No neck pain, back pain Integumentary: No  rash, pruritus, skin lesions Neurological: as above Psychiatric: No depression or anxiety Endocrine: No palpitations, diaphoresis, change in appetite, change in weigh or increased thirst Hematologic/Lymphatic:  No anemia, purpura, petechiae. Allergic/Immunologic: No itchy/runny eyes, nasal congestion, recent allergic reactions, rashes  ALLERGIES: Allergies  Allergen Reactions   Sulfa Antibiotics Nausea Only    HOME MEDICATIONS: Outpatient Medications Prior to Visit  Medication Sig Dispense Refill   amphetamine-dextroamphetamine (ADDERALL) 20 MG tablet Take 1 tablet by mouth 3 (three) times daily.     Multiple Vitamin (MULTIVITAMIN WITH MINERALS) TABS tablet Take 1 tablet by mouth daily.     ocrelizumab 600 mg in sodium chloride 0.9 % 500 mL Inject 600 mg into the vein every 6 (six) months.     rosuvastatin (CRESTOR) 5 MG tablet Take 5 mg by mouth daily.     sildenafil (VIAGRA) 100 MG tablet Take 1 tablet (100 mg total) by mouth daily as needed for erectile dysfunction. 30 tablet 3   zolpidem (AMBIEN) 10 MG tablet Take 10 mg by mouth at bedtime as needed for sleep.     ondansetron (ZOFRAN) 8 MG tablet Take 0.5 tablets (4 mg total) by mouth daily as needed for nausea or vomiting. 30 tablet 5   simvastatin (ZOCOR) 20 MG tablet Take 40 mg by mouth daily.      No facility-administered medications prior to visit.    PAST MEDICAL HISTORY: Past Medical History:  Diagnosis Date   Anemia    G6PD   Anxiety    Back pain, chronic    low   Blood transfusion without reported diagnosis    Depression    DVT (deep venous thrombosis) (HCC)    Dyslipidemia    Gait disturbance    Gynecomastia, male    Hx of dizziness    Hypothyroidism    MS (multiple sclerosis) (HCC)    probable   PONV (postoperative nausea and vomiting)    Nausea   Snoring    "mild" sleep apnea. Pt was not required to wear CPAP machine, instead he uses nasal spray   Testosterone deficiency    Vision abnormalities      PAST SURGICAL HISTORY: Past Surgical History:  Procedure Laterality Date   BUNIONECTOMY Right    GYNECOMASTIA EXCISION     X 2   GYNECOMASTIA MASTECTOMY Bilateral 03/09/2014   Procedure: BILATERAL EXCISION GYNECOMASTIA;  Surgeon: Robyne Askew, MD;  Location: [facility] OR;  Service: General;  Laterality: Bilateral;   ULNAR NERVE REPAIR Left     FAMILY HISTORY: Family History  Problem Relation Age of Onset   Breast cancer Mother    Hypertension Father    Prostate cancer Father    Clotting disorder Father    Hyperlipidemia Father    Hypertension Brother    Hypertension  Brother    Bladder Cancer Paternal Grandfather    Stroke Paternal Grandfather     SOCIAL HISTORY:  Social History   Socioeconomic History   Marital status: Single    Spouse name: Not on file   Number of children: 0   Years of education: College    Highest education level: Not on file  Occupational History   Occupation: truck driver  Tobacco Use   Smoking status: Never   Smokeless tobacco: Never  Vaping Use   Vaping Use: Never used  Substance and Sexual Activity   Alcohol use: Yes    Alcohol/week: 0.0 standard drinks    Comment: social   Drug use: No   Sexual activity: Not on file  Other Topics Concern   Not on file  Social History Narrative   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    Patient has a 4 year college education.    Patient has no children.          Social Determinants of Health   Financial Resource Strain: Not on file  Food Insecurity: Not on file  Transportation Needs: Not on file  Physical Activity: Not on file  Stress: Not on file  Social Connections: Not on file  Intimate Partner Violence: Not on file     PHYSICAL EXAM  Vitals:   03/23/21 0824  BP: 130/75  Pulse: 87  Weight: 187 lb (84.8 kg)  Height: 5\' 6"  (1.676 m)    Body mass index is 30.18 kg/m.   General: The patient is well-developed and well-nourished and in no acute distress   Neurologic  Exam  Mental status: The patient is alert and oriented x 3 at the time of the examination. The patient has apparent normal recent and remote memory, with an apparently normal attention span and concentration ability.   Speech is normal.  Cranial nerves: Extraocular movements are full.  Facial strength and sensation is normal.  Trapezius strength is normal.  No obvious hearing deficits are noted.  Motor:  Muscle bulk and tone are normal. Strength is  5 / 5 in all 4 extremities.   Sensory: Sensory testing is intact to touch and vibration in the arms and legs.  Coordination: Cerebellar testing reveals mildly reduced right finger-nose-finger and heel-to-shin.  Gait and station: Station is normal.  Gait and tandem gait are normal.  Romberg is negative.  Reflexes: Deep tendon reflexes are 2 at arms and ankles and 3 at knees.  There is no ankle clonus.  .      _________________________________________________ Multiple sclerosis (HCC) - Plan: MR BRAIN WO CONTRAST, MR CERVICAL SPINE WO CONTRAST  Paresthesia  Other fatigue  Urinary frequency  Vitamin D deficiency  High risk medication use   1.   His MS has done well on Ocrevus.  He will continue.  IgM and IgG were normal recently.  We need to check an MRI of the brain and cervical spine to determine if there has been any subclinical progression.  If this is occurring we would need to consider a different disease modifying therapy. 2.   Oxybutynin  prn bladder urgency. 3.   Stay active and exercise as tolerated.  Take OTC vitamin D. 4.   Return in 6 months or sooner if there are new or worsening neurologic symptoms.  S Terrion Gencarelli A. , MD, PhD 03/23/2021, 10:33 AM Certified in Neurology, Clinical Neurophysiology, Sleep Medicine, Pain Medicine and Neuroimaging  [facility] [address], [address] [place],  [place] [address] [phone]

## 2021-03-24 ENCOUNTER — Telehealth: Payer: Self-pay | Admitting: Neurology

## 2021-03-24 MED ORDER — ALPRAZOLAM 0.5 MG PO TABS: ORAL_TABLET | ORAL | 0 refills | Status: DC

## 2021-03-24 NOTE — Telephone Encounter (Signed)
MR Brain wo contrast & MR Cervical spine wo contrast Dr. Epimenio Foot [facility]: [id number] & [phone] (exp. 03/24/21 to 05/08/21).   Patient is scheduled at [name] for 03/30/21.  He informed me he is claustrophobic he would like something to help him with the MRI. I also informed him he needs to have a driver.

## 2021-03-24 NOTE — Addendum Note (Signed)
Addended by: Despina Arias A on: 03/24/2021 03:02 PM   Modules accepted: Orders

## 2021-03-24 NOTE — Addendum Note (Signed)
Addended by: Marcelina Morel L on: 03/24/2021 11:04 AM   Modules accepted: Orders

## 2021-03-24 NOTE — Telephone Encounter (Signed)
[employer] Brain pending faxed notes.  Cervical auth: [id number] (exp. 03/24/21 to 05/08/21).

## 2021-03-30 ENCOUNTER — Ambulatory Visit: Payer: 59

## 2021-03-30 DIAGNOSIS — G35 Multiple sclerosis: Secondary | ICD-10-CM

## 2021-07-26 ENCOUNTER — Telehealth: Payer: Self-pay | Admitting: Neurology

## 2021-07-26 NOTE — Telephone Encounter (Signed)
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 Pt requesting a call back.

## 2021-07-26 NOTE — Telephone Encounter (Signed)
Called the pt back. He is just in need of his yearly letter that we complete for his DOT physical. I have completed that and will have Dr. Epimenio Foot sign and then will place at the front for pick up. Pt was appreciative for the call back.

## 2021-09-03 IMAGING — CT CT ABD-PELV W/ CM
1 of 3 series · 14 of 32 positions shown, 19 images · IV contrast (APPLIED)
Comparison: None.

CLINICAL DATA: Right-sided abdominal wall mass and pain for 2
months.

EXAM:
CT ABDOMEN AND PELVIS WITH CONTRAST
TECHNIQUE: Multidetector CT imaging of the abdomen and pelvis was performed
using the standard protocol following bolus administration of
intravenous contrast.
CONTRAST:  100mL [id number] IOPAMIDOL ([id number]) INJECTION 61%

[Series 2: abd/pelvis w/cm · axial · 0.70mm/px · z∈[-442,-22]mm · 14 of 96 slices shown, 19 images]
[im 6/96  soft-tissue]
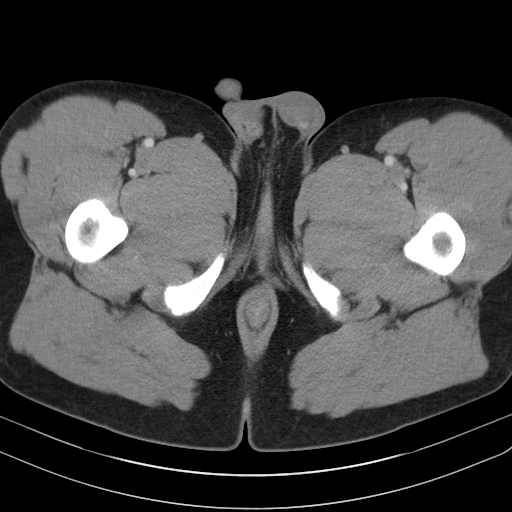
[im 6/96  bone]
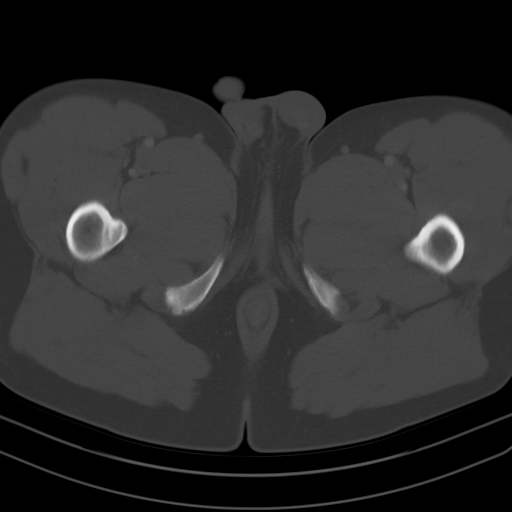
[im 12/96  soft-tissue]
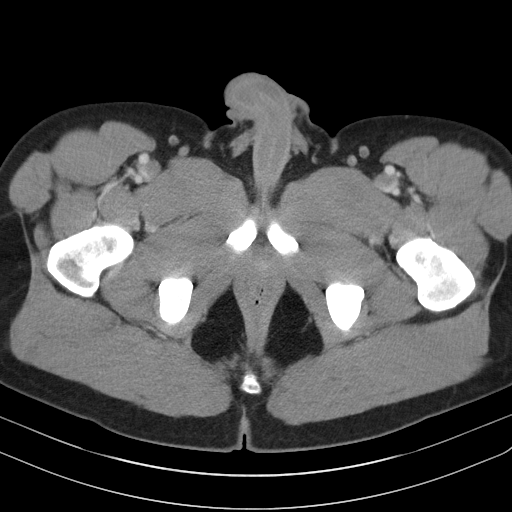
[im 23/96  soft-tissue]
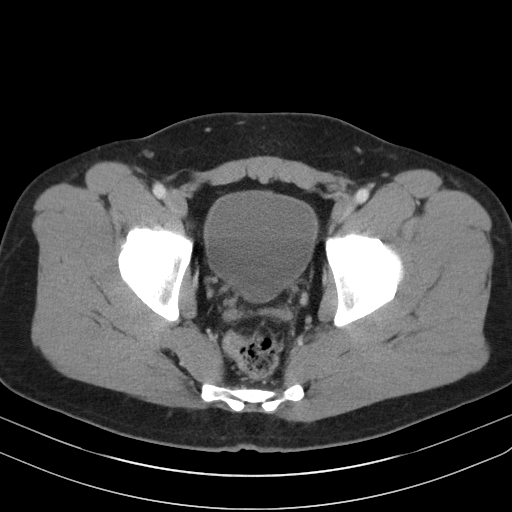
[im 28/96  soft-tissue]
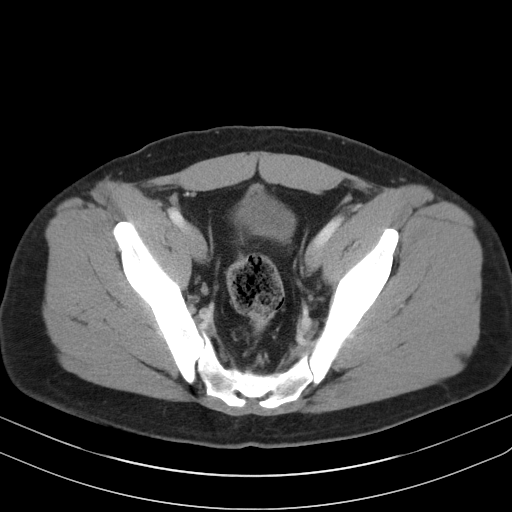
[im 34/96  soft-tissue]
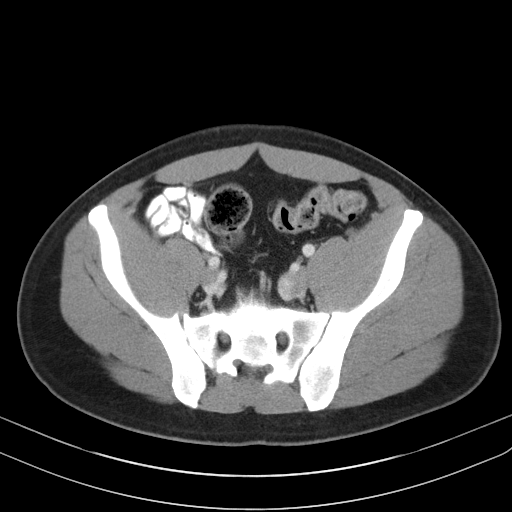
[im 40/96  soft-tissue]
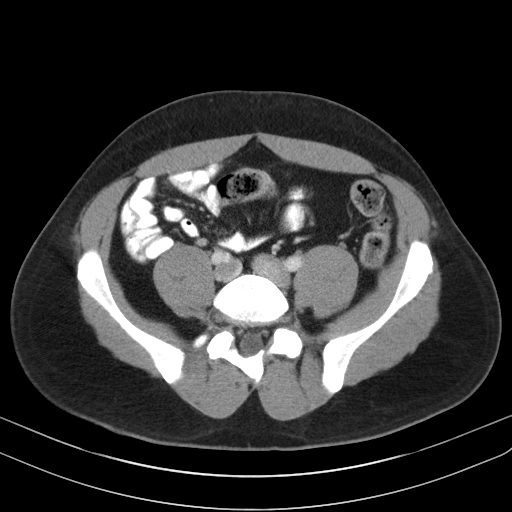
[im 51/96  soft-tissue]
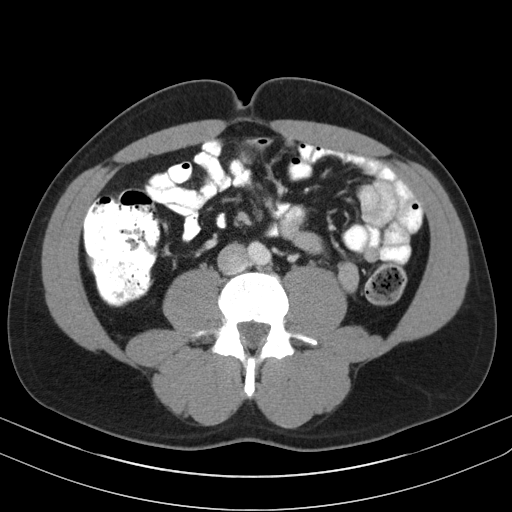
[im 56/96  soft-tissue]
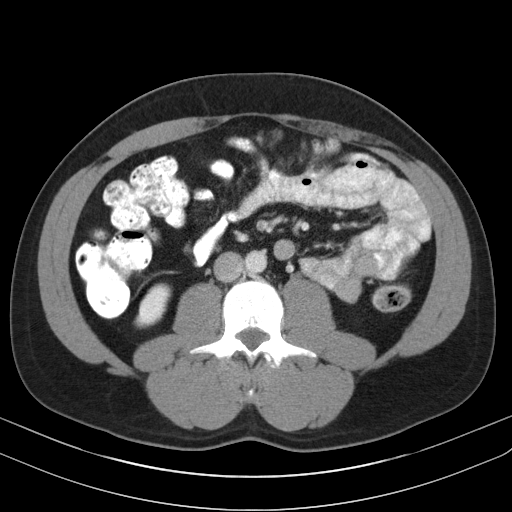
[im 62/96  soft-tissue]
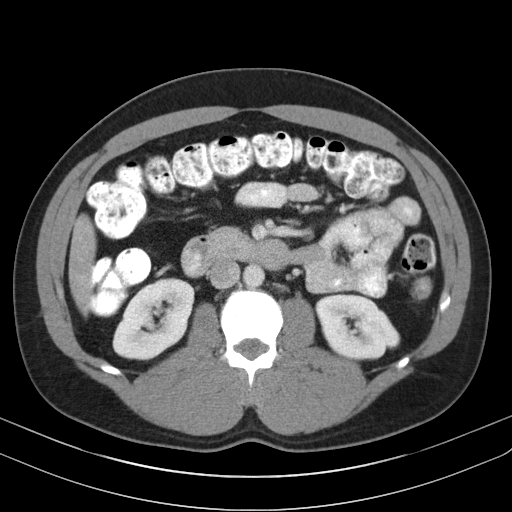
[im 62/96  bone]
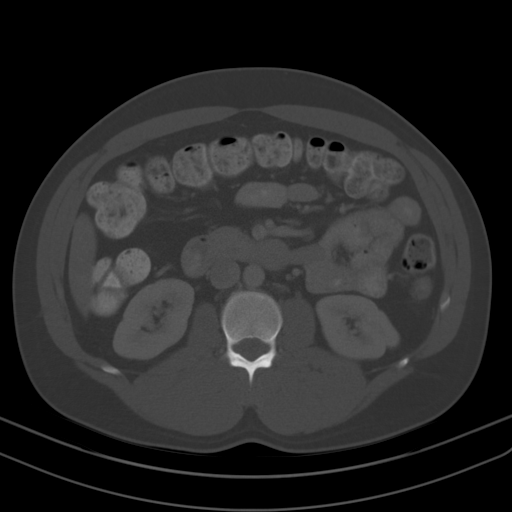
[im 68/96  soft-tissue]
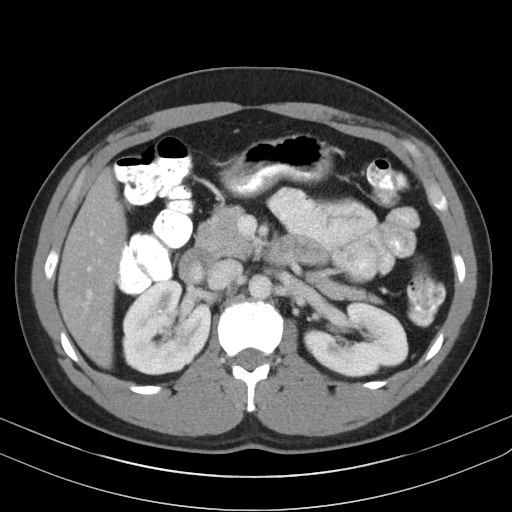
[im 73/96  soft-tissue]
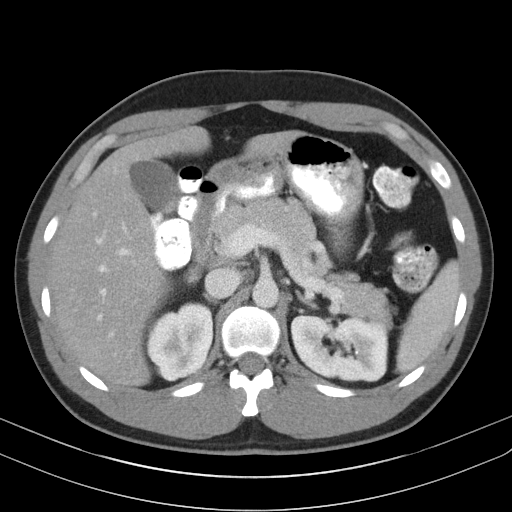
[im 73/96  lung]
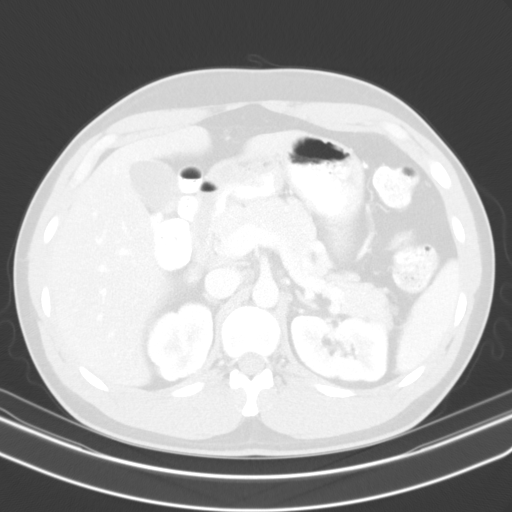
[im 79/96  lung]
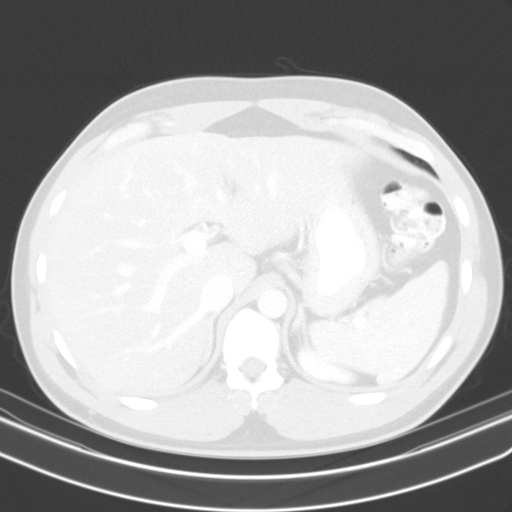
[im 84/96  soft-tissue]
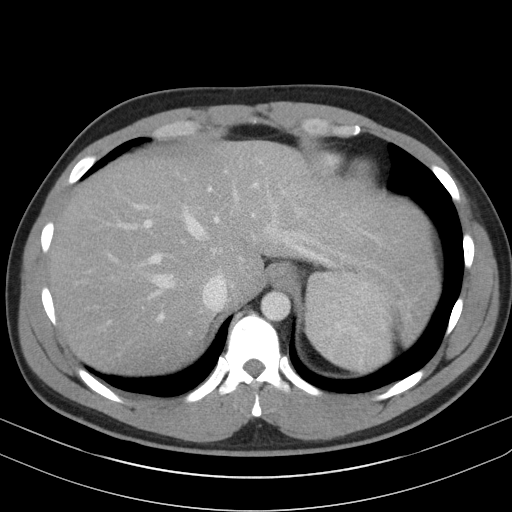
[im 84/96  lung]
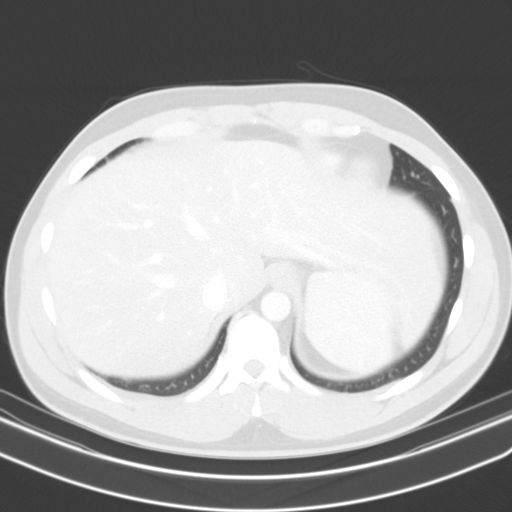
[im 90/96  soft-tissue]
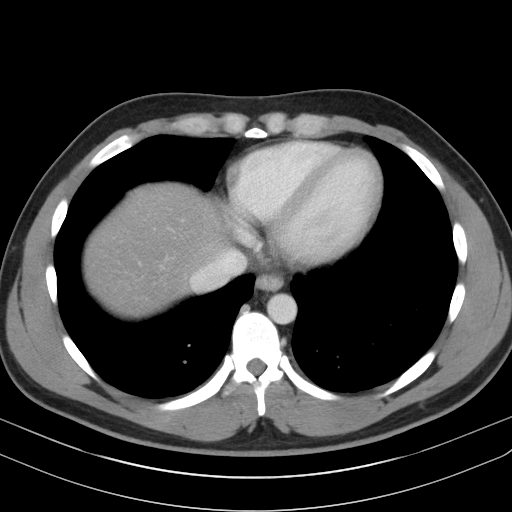
[im 90/96  lung]
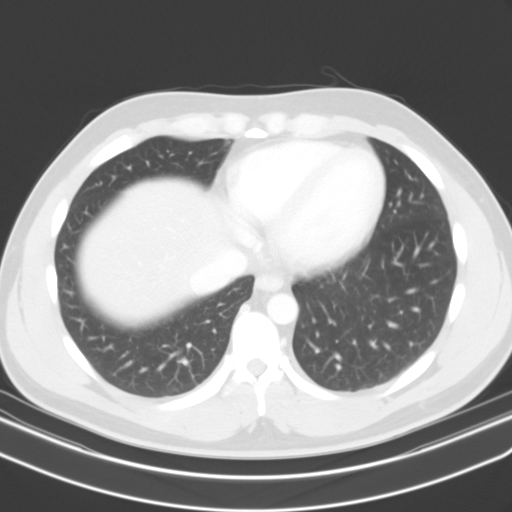

[14 of 32 positions shown; findings below may reference images not displayed]

FINDINGS: Lower Chest: No acute findings.

Hepatobiliary: No hepatic masses identified. Mild-to-moderate
diffuse hepatic steatosis is noted. Gallbladder is unremarkable. No
evidence of biliary ductal dilatation.

Pancreas:  No mass or inflammatory changes.

Spleen: Within normal limits in size and appearance.

Adrenals/Urinary Tract: Several tiny 2-3 mm renal calculi are seen
bilaterally. No evidence of ureteral calculi or hydronephrosis. No
masses identified. Unremarkable unopacified urinary bladder.

Stomach/Bowel: No evidence of obstruction, inflammatory process or
abnormal fluid collections. Normal appendix visualized.

Vascular/Lymphatic: No pathologically enlarged lymph nodes. No
abdominal aortic aneurysm.

Reproductive:  No mass or other significant abnormality.

Other: No evidence abdominal wall mass, hernia, or inflammatory
process.

Musculoskeletal:  No suspicious bone lesions identified.
IMPRESSION: 1. No evidence of abdominal mass, hernia, or other acute findings.
2. Bilateral nephrolithiasis. No evidence of ureteral calculi or
hydronephrosis.
3. Hepatic steatosis.

## 2021-09-26 ENCOUNTER — Ambulatory Visit: Payer: 59 | Admitting: Family Medicine

## 2021-10-31 ENCOUNTER — Other Ambulatory Visit: Payer: Self-pay

## 2021-10-31 MED ORDER — SILDENAFIL CITRATE 100 MG PO TABS: 100.0000 mg | ORAL_TABLET | Freq: Every day | ORAL | 3 refills | Status: DC | PRN

## 2021-11-30 NOTE — Progress Notes (Signed)
? ? ?PATIENT: Jeremy [name] ?DOB: Aug 04, 1980 ? ?REASON FOR VISIT: follow up ?HISTORY FROM: patient ? ?Chief Complaint  ?Patient presents with  ? Follow-up  ?  Rm 1, alone. Here for 6 month MS f/u, on Ocrevus and tolerating well. Pt reports eye twitching, probably due to being tired.   ?  ? ?HISTORY OF PRESENT ILLNESS: ? ?12/01/21 ALL:  ?He returns for follow up for RRMS. He continues Ocrevus infusions, last infusion 06/2021, next scheduled for 12/23/2021. Labs stable 12/2020. MRI stable 03/2021.  ? ?He is feeling well, today. No new or exacerbating symptoms. Gait is stable. No significant pain. He is exercising regularly. No vision changes. No difficulty with bowel or bladder functioning.  ? ?Adderall 20mg  TID helps with inattention/MS fatigue (prescribed by PCP). He takes this consistently. Insomnia is stable. He takes Ambien (prescribed by PCP) most nights. He is a full time truck driver. He has a fairly consistent schedule. Mood is good. Sildenafil helps with ED. He does take vitamin D 2,000iu daily.  ? ?He has not had any recurrence of DVT.   ? ?HISTORY: (copied from Dr previous note) ? ?Jeremy [name] is a 41 y.o. man with relapsing remitting multiple sclerosis.   ?  ?Update 03/23/2021: ?He feels his MS is stable.    He is on Ocrevus and tolerates it well.    He has no exacerbation or new neurologic symptom.   His last infusion was last week.   No exacerbation.  Last MRI of the brain was performed 06/19/2018 and showed no new lesions.  MRI of the cervical spine in the past had shown foci at C4-C5 and C6. ?  ?IgG and IgM and IgA were fine 12/23/20 ?  ?He is walking well.  He denies difficulty with balance, strength or sensation.   He occasionally has tingling in an arm for a few minutes.    ?  ?He has some nocturia and urinary frequency.  As he adapts his schedule, this is not a problem.    He uses sildenafil as needed for ED. ?  ?He has some fatigue not interfering with activities.   e notes reduced focus and  attention.   He feels Adderall helps and and also helps his MS fatigue.  He sleeps well most nights.  He takes Ambien  He feels mood is doing well. ?  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  ?  ?MS History:   He was diagnosed in January 2012.   In 2009, he had a several week episode of right hand clumsiness associated with some electric shock sensations. He did not see a doctor about the symptoms. In December 2011, he had the onset of altered taste. A week later, he had nausea and dizziness and over the next couple days these symptoms worsened and he developed right-sided clumsiness and slurred speech. First toward his PCP who felt he might have an ear infection and he was referred to ENT. ENT evaluated him and referred him to Dr. 09-19-1995 at [facility] who noted an ataxic gait and ordered an MRI of the spine and brain. Those studies were consistent with multiple sclerosis and he underwent a lumbar puncture in April 2011 that was also consistent with MS. He was started on Rebif in June 2011 and remain on the medicine until September 2014 when a repeat MRI showed the interval development of several more lesions. He was switched to October 2014 but insurance  would no longer cover it so we switched to Gilenya early 2016.  He had elevated liver enzyme tests on Gilenya and was switched to every other day but transaminase elevation persisted.  Therefore, he switched to Ocrevus in late 2017..    ?  ?MRIs from 05/25/2014 show a moderate size focus in the left temporal lobe and another 10 fairly small periventricular and deep white matter foci. MRI of the cervical spine showed 2 foci, one at C4 and one at C6. There were no enhancing lesions on those MRIs. ?  ?MRI of the brain 06/16/2015 showed no new lesions.   ?  ?MRI of the brain 03/15/2016 showed no new lesions.  MRI of the cervical spine 02/08/2017 showed 2 small foci laterally to the right at C4-C5 and laterally to the left at C6.   These were present on the previous MRI. ?  ?MRI of the brain 06/19/2018 showed no new lesions. ? ?REVIEW OF SYSTEMS: Out of a complete 14 system review of symptoms, the patient complains only of the following symptoms, erectile dysfunction, insomnia, inattention, fatigue and all other reviewed systems are negative. ? ? ?ALLERGIES: ?Allergies  ?Allergen Reactions  ? Sulfa Antibiotics Nausea Only  ? ? ?HOME MEDICATIONS: ?Outpatient Medications Prior to Visit  ?Medication Sig Dispense Refill  ? amphetamine-dextroamphetamine (ADDERALL) 20 MG tablet Take 1 tablet by mouth 3 (three) times daily.    ? Multiple Vitamin (MULTIVITAMIN WITH MINERALS) TABS tablet Take 1 tablet by mouth daily.    ? ocrelizumab 600 mg in sodium chloride 0.9 % 500 mL Inject 600 mg into the vein every 6 (six) months.    ? ondansetron (ZOFRAN) 8 MG tablet Take 0.5 tablets (4 mg total) by mouth daily as needed for nausea or vomiting. 30 tablet 5  ? rosuvastatin (CRESTOR) 5 MG tablet Take 5 mg by mouth daily.    ? sildenafil (VIAGRA) 100 MG tablet Take 1 tablet (100 mg total) by mouth daily as needed for erectile dysfunction. 30 tablet 3  ? zolpidem (AMBIEN) 10 MG tablet Take 10 mg by mouth at bedtime as needed for sleep.    ? ALPRAZolam (XANAX) 0.5 MG tablet Take 1-2 tablets by mouth 30 min prior to MRI. Can take 1 additional tablet at time of test if needed. Must have driver to and from test, can cause drowsiness. 3 tablet 0  ? ?No facility-administered medications prior to visit.  ? ? ?PAST MEDICAL HISTORY: ?Past Medical History:  ?Diagnosis Date  ? Anemia   ? G6PD  ? Anxiety   ? Back pain, chronic   ? low  ? Blood transfusion without reported diagnosis   ? Depression   ? DVT (deep venous thrombosis) (HCC)   ? Dyslipidemia   ? Gait disturbance   ? Gynecomastia, male   ? Hx of dizziness   ? Hypothyroidism   ? MS (multiple sclerosis) (HCC)   ? probable  ? PONV (postoperative nausea and vomiting)   ? Nausea  ? Snoring   ? "mild" sleep apnea. Pt was  not required to wear CPAP machine, instead he uses nasal spray  ? Testosterone deficiency   ? Vision abnormalities   ? ? ?PAST SURGICAL HISTORY: ?Past Surgical History:  ?Procedure Laterality Date  ? BUNIONECTOMY Right   ? GYNECOMASTIA EXCISION    ? X 2  ? GYNECOMASTIA MASTECTOMY Bilateral 03/09/2014  ? Procedure: BILATERAL EXCISION GYNECOMASTIA;  Surgeon: Robyne Askew, MD;  Location: [facility] OR;  Service: General;  Laterality:  Bilateral;  ? ULNAR NERVE REPAIR Left   ? ? ?FAMILY HISTORY: ?Family History  ?Problem Relation Age of Onset  ? Breast cancer Mother   ? Hypertension Father   ? Prostate cancer Father   ? Clotting disorder Father   ? Hyperlipidemia Father   ? Hypertension Brother   ? Hypertension Brother   ? Bladder Cancer Paternal Grandfather   ? Stroke Paternal Grandfather   ? ? ?SOCIAL HISTORY: ?Social History  ? ?Socioeconomic History  ? Marital status: Single  ?  Spouse name: Not on file  ? Number of children: 0  ? Years of education: College   ? Highest education level: Not on file  ?Occupational History  ? Occupation: truck Hospital doctor  ?Tobacco Use  ? Smoking status: Never  ? Smokeless tobacco: Never  ?Vaping Use  ? Vaping Use: Never used  ?Substance and Sexual Activity  ? Alcohol use: Yes  ?  Alcohol/week: 0.0 standard drinks  ?  Comment: social  ? Drug use: No  ? Sexual activity: Not on file  ?Other Topics Concern  ? Not on file  ?Social History Narrative  ? 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   ? Patient has a 4 year college education.   ? Patient has no children.   ?   ?   ? ?Social Determinants of Health  ? ?Financial Resource Strain: Not on file  ?Food Insecurity: Not on file  ?Transportation Needs: Not on file  ?Physical Activity: Not on file  ?Stress: Not on file  ?Social Connections: Not on file  ?Intimate Partner Violence: Not on file  ? ? ? ? ?PHYSICAL EXAM ? ?Vitals:  ? 12/01/21 0716  ?BP: 117/76  ?Pulse: 75  ?Weight: 175 lb 8 oz (79.6 kg)  ?Height:  (1.676 m)  ? ? ?Body mass index is  28.33 kg/m?. ? ?Generalized: Well developed, in no acute distress  ?Cardiology: normal rate and rhythm, no murmur noted ?Respiratory: clear to auscultation bilaterally  ?Neurological examination  ?Mentation: Alert

## 2021-11-30 NOTE — Patient Instructions (Signed)
Below is our plan: ? ?We will continue current treatment plan  ? ?Please make sure you are staying well hydrated. I recommend 50-60 ounces daily. Well balanced diet and regular exercise encouraged. Consistent sleep schedule with 6-8 hours recommended.  ? ?Please continue follow up with care team as directed.  ? ?Follow up with me in 6 months ? ?You may receive a survey regarding today's visit. I encourage you to leave honest feed back as I do use this information to improve patient care. Thank you for seeing me today!  ? ? ?

## 2021-12-01 ENCOUNTER — Ambulatory Visit: Payer: 59 | Admitting: Family Medicine

## 2021-12-01 ENCOUNTER — Encounter: Payer: Self-pay | Admitting: Family Medicine

## 2021-12-01 VITALS — BP 117/76 | HR 75 | Ht 66.0 in | Wt 175.5 lb

## 2021-12-01 DIAGNOSIS — R5383 Other fatigue: Secondary | ICD-10-CM | POA: Diagnosis not present

## 2021-12-01 DIAGNOSIS — N521 Erectile dysfunction due to diseases classified elsewhere: Secondary | ICD-10-CM

## 2021-12-01 DIAGNOSIS — G35 Multiple sclerosis: Secondary | ICD-10-CM

## 2021-12-01 DIAGNOSIS — G4700 Insomnia, unspecified: Secondary | ICD-10-CM

## 2021-12-01 DIAGNOSIS — E559 Vitamin D deficiency, unspecified: Secondary | ICD-10-CM | POA: Diagnosis not present

## 2021-12-01 DIAGNOSIS — Z79899 Other long term (current) drug therapy: Secondary | ICD-10-CM

## 2021-12-02 LAB — VITAMIN D 25 HYDROXY (VIT D DEFICIENCY, FRACTURES): Vit D, 25-Hydroxy: 40 ng/mL (ref 30.0–100.0)

## 2021-12-02 LAB — CBC WITH DIFFERENTIAL/PLATELET
Basophils Absolute: 0.1 10*3/uL (ref 0.0–0.2)
Basos: 1 %
EOS (ABSOLUTE): 0.1 10*3/uL (ref 0.0–0.4)
Eos: 2 %
Hematocrit: 50.5 % (ref 37.5–51.0)
Hemoglobin: 16.6 g/dL (ref 13.0–17.7)
Immature Grans (Abs): 0.1 10*3/uL (ref 0.0–0.1)
Immature Granulocytes: 1 %
Lymphocytes Absolute: 1.2 10*3/uL (ref 0.7–3.1)
Lymphs: 16 %
MCH: 29.2 pg (ref 26.6–33.0)
MCHC: 32.9 g/dL (ref 31.5–35.7)
MCV: 89 fL (ref 79–97)
Monocytes Absolute: 0.6 10*3/uL (ref 0.1–0.9)
Monocytes: 8 %
Neutrophils Absolute: 5.5 10*3/uL (ref 1.4–7.0)
Neutrophils: 72 %
Platelets: 257 10*3/uL (ref 150–450)
RBC: 5.68 x10E6/uL (ref 4.14–5.80)
RDW: 13.5 % (ref 11.6–15.4)
WBC: 7.5 10*3/uL (ref 3.4–10.8)

## 2021-12-02 LAB — IGG, IGA, IGM
IgA/Immunoglobulin A, Serum: 85 mg/dL — ABNORMAL LOW (ref 90–386)
IgG (Immunoglobin G), Serum: 791 mg/dL (ref 603–1613)
IgM (Immunoglobulin M), Srm: 27 mg/dL (ref 20–172)

## 2022-06-13 NOTE — Progress Notes (Signed)
PATIENT: Jeremy [name] DOB: 03/20/1981  REASON FOR VISIT: follow up HISTORY FROM: patient  Chief Complaint  Patient presents with   Follow-up    RM 10, alone. Doing well on ocrevus. Due on 07/03/22.     HISTORY OF PRESENT ILLNESS:  06/19/22 ALL:  He returns for follow up for RRMS. He continues Ocrevus infusions, last infusion 01/02/2022, next scheduled for 07/03/2022. Labs stable 11/2021. MRI stable 03/2021.   He is feeling well, today. No new or exacerbating symptoms. Gait is stable. No significant pain. He is exercising regularly. No vision changes. No difficulty with bowel or bladder functioning.   Adderall 20mg  TID helps with inattention/MS fatigue (prescribed by PCP). He takes this consistently. Insomnia is stable. He takes Ambien (prescribed by PCP) most nights. He is a full time truck driver. He has a fairly consistent schedule. Mood is good. Sildenafil helps with ED. He does take vitamin D 2,000iu daily.   He has not had any recurrence of DVT.    HISTORY: (copied from Dr previous note)  Jeremy [name] is a 41 y.o. man with relapsing remitting multiple sclerosis.     Update 03/23/2021: He feels his MS is stable.    He is on Ocrevus and tolerates it well.    He has no exacerbation or new neurologic symptom.   His last infusion was last week.   No exacerbation.  Last MRI of the brain was performed 06/19/2018 and showed no new lesions.  MRI of the cervical spine in the past had shown foci at C4-C5 and C6.   IgG and IgM and IgA were fine 12/23/20   He is walking well.  He denies difficulty with balance, strength or sensation.   He occasionally has tingling in an arm for a few minutes.      He has some nocturia and urinary frequency.  As he adapts his schedule, this is not a problem.    He uses sildenafil as needed for ED.   He has some fatigue not interfering with activities.   e notes reduced focus and attention.   He feels Adderall helps and and also helps his MS fatigue.   He sleeps well most nights.  He takes Ambien  He feels mood is doing well.   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    MS History:   He was diagnosed in January 2012.   In 2009, he had a several week episode of right hand clumsiness associated with some electric shock sensations. He did not see a doctor about the symptoms. In December 2011, he had the onset of altered taste. A week later, he had nausea and dizziness and over the next couple days these symptoms worsened and he developed right-sided clumsiness and slurred speech. First toward his PCP who felt he might have an ear infection and he was referred to ENT. ENT evaluated him and referred him to Dr. 09-19-1995 at [facility] who noted an ataxic gait and ordered an MRI of the spine and brain. Those studies were consistent with multiple sclerosis and he underwent a lumbar puncture in April 2011 that was also consistent with MS. He was started on Rebif in June 2011 and remain on the medicine until September 2014 when a repeat MRI showed the interval development of several more lesions. He was switched to October 2014 but insurance would no longer cover it so we switched to Gilenya early 2016.  He  had elevated liver enzyme tests on Gilenya and was switched to every other day but transaminase elevation persisted.  Therefore, he switched to Ocrevus in late 2017.Marland Kitchen      MRIs from 05/25/2014 show a moderate size focus in the left temporal lobe and another 10 fairly small periventricular and deep white matter foci. MRI of the cervical spine showed 2 foci, one at C4 and one at C6. There were no enhancing lesions on those MRIs.   MRI of the brain 06/16/2015 showed no new lesions.     MRI of the brain 03/15/2016 showed no new lesions.  MRI of the cervical spine 02/08/2017 showed 2 small foci laterally to the right at C4-C5 and laterally to the left at C6.  These were present on the previous MRI.   MRI of the brain 06/19/2018  showed no new lesions.  REVIEW OF SYSTEMS: Out of a complete 14 system review of symptoms, the patient complains only of the following symptoms, erectile dysfunction, insomnia, inattention, fatigue and all other reviewed systems are negative.   ALLERGIES: Allergies  Allergen Reactions   Sulfa Antibiotics Nausea Only    HOME MEDICATIONS: Outpatient Medications Prior to Visit  Medication Sig Dispense Refill   amphetamine-dextroamphetamine (ADDERALL) 20 MG tablet Take 1 tablet by mouth 3 (three) times daily.     Multiple Vitamin (MULTIVITAMIN WITH MINERALS) TABS tablet Take 1 tablet by mouth daily.     ocrelizumab 600 mg in sodium chloride 0.9 % 500 mL Inject 600 mg into the vein every 6 (six) months.     ondansetron (ZOFRAN) 8 MG tablet Take 0.5 tablets (4 mg total) by mouth daily as needed for nausea or vomiting. 30 tablet 5   rosuvastatin (CRESTOR) 5 MG tablet Take 5 mg by mouth daily.     sildenafil (VIAGRA) 100 MG tablet Take 1 tablet (100 mg total) by mouth daily as needed for erectile dysfunction. 30 tablet 3   zolpidem (AMBIEN) 10 MG tablet Take 10 mg by mouth at bedtime as needed for sleep.     No facility-administered medications prior to visit.    PAST MEDICAL HISTORY: Past Medical History:  Diagnosis Date   Anemia    G6PD   Anxiety    Back pain, chronic    low   Blood transfusion without reported diagnosis    Depression    DVT (deep venous thrombosis) (HCC)    Dyslipidemia    Gait disturbance    Gynecomastia, male    Hx of dizziness    Hypothyroidism    MS (multiple sclerosis) (HCC)    probable   PONV (postoperative nausea and vomiting)    Nausea   Snoring    "mild" sleep apnea. Pt was not required to wear CPAP machine, instead he uses nasal spray   Testosterone deficiency    Vision abnormalities     PAST SURGICAL HISTORY: Past Surgical History:  Procedure Laterality Date   BUNIONECTOMY Right    GYNECOMASTIA EXCISION     X 2   GYNECOMASTIA MASTECTOMY  Bilateral 03/09/2014   Procedure: BILATERAL EXCISION GYNECOMASTIA;  Surgeon: Robyne Askew, MD;  Location: [facility] OR;  Service: General;  Laterality: Bilateral;   ULNAR NERVE REPAIR Left     FAMILY HISTORY: Family History  Problem Relation Age of Onset   Breast cancer Mother    Hypertension Father    Prostate cancer Father    Clotting disorder Father    Hyperlipidemia Father    Hypertension Brother  Hypertension Brother    Bladder Cancer Paternal Grandfather    Stroke Paternal Grandfather     SOCIAL HISTORY: Social History   Socioeconomic History   Marital status: Single    Spouse name: Not on file   Number of children: 0   Years of education: College    Highest education level: Not on file  Occupational History   Occupation: truck driver  Tobacco Use   Smoking status: Never   Smokeless tobacco: Never  Vaping Use   Vaping Use: Never used  Substance and Sexual Activity   Alcohol use: Yes    Alcohol/week: 0.0 standard drinks of alcohol    Comment: social   Drug use: No   Sexual activity: Not on file  Other Topics Concern   Not on file  Social History Narrative   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    Patient has a 4 year college education.    Patient has no children.          Social Determinants of Health   Financial Resource Strain: Not on file  Food Insecurity: Not on file  Transportation Needs: Not on file  Physical Activity: Not on file  Stress: Not on file  Social Connections: Not on file  Intimate Partner Violence: Not on file      PHYSICAL EXAM  Vitals:   06/19/22 0845  BP: 111/67  Pulse: 74  Weight: 177 lb (80.3 kg)  Height: 5\' 6"  (1.676 m)     Body mass index is 28.57 kg/m.  Generalized: Well developed, in no acute distress  Cardiology: normal rate and rhythm, no murmur noted Respiratory: clear to auscultation bilaterally  Neurological examination  Mentation: Alert oriented to time, place, history taking. Follows all commands  speech and language fluent Cranial nerve II-XII: Pupils were equal round reactive to light. Extraocular movements were full, visual field were full on confrontational test. Facial sensation and strength were normal. Uvula tongue midline. Head turning and shoulder shrug  were normal and symmetric. Motor: The motor testing reveals 5 over 5 strength of all 4 extremities. Good symmetric motor tone is noted throughout.  Sensory: Sensory testing is intact to soft touch on all 4 extremities. No evidence of extinction is noted.  Coordination: Cerebellar testing reveals good finger-nose-finger and heel-to-shin bilaterally.  Gait and station: Gait is normal.  Reflexes: Deep tendon reflexes are symmetric and normal bilaterally.   DIAGNOSTIC DATA (LABS, IMAGING, TESTING) - I reviewed patient records, labs, notes, testing and imaging myself where available.      No data to display           Lab Results  Component Value Date   WBC 7.5 12/01/2021   HGB 16.6 12/01/2021   HCT 50.5 12/01/2021   MCV 89 12/01/2021   PLT 257 12/01/2021      Component Value Date/Time   NA 141 09/20/2020 0927   K 5.0 09/20/2020 0927   CL 104 09/20/2020 0927   CO2 20 09/20/2020 0927   GLUCOSE 90 09/20/2020 0927   GLUCOSE 87 01/16/2020 0928   BUN 16 09/20/2020 0927   CREATININE 1.00 09/20/2020 0927   CREATININE 1.06 01/16/2020 0928   CALCIUM 9.8 09/20/2020 0927   PROT 7.0 09/20/2020 0927   ALBUMIN 4.9 09/20/2020 0927   AST 29 09/20/2020 0927   AST 25 01/16/2020 0928   ALT 46 (H) 09/20/2020 0927   ALT 49 (H) 01/16/2020 0928   ALKPHOS 68 09/20/2020 0927   BILITOT 0.9 09/20/2020  5374   BILITOT 0.5 01/16/2020 0928   GFRNONAA >60 01/16/2020 0928   GFRAA >60 01/16/2020 0928   No results found for: "CHOL", "HDL", "LDLCALC", "LDLDIRECT", "TRIG", "CHOLHDL" No results found for: "HGBA1C" No results found for: "VITAMINB12" No results found for: "TSH"   ASSESSMENT AND PLAN 41 y.o. year old male  has a past  medical history of Anemia, Anxiety, Back pain, chronic, Blood transfusion without reported diagnosis, Depression, DVT (deep venous thrombosis) (HCC), Dyslipidemia, Gait disturbance, Gynecomastia, male, dizziness, Hypothyroidism, MS (multiple sclerosis) (HCC), PONV (postoperative nausea and vomiting), Snoring, Testosterone deficiency, and Vision abnormalities. here with     ICD-10-CM   1. Relapsing remitting multiple sclerosis (HCC)  G35 IgG, IgA, IgM    CBC with Differential/Platelets    2. High risk medication use  Z79.899 IgG, IgA, IgM    CBC with Differential/Platelets    3. Other fatigue  R53.83     4. Vitamin D deficiency  E55.9 Vitamin D, 25-hydroxy    5. Insomnia, unspecified type  G47.00     6. Erectile dysfunction due to diseases classified elsewhere  N52.1         Nathanual is doing well. He continues to tolerate Ocrevus. We will continue current treatment plan. We will update labs, today. MRI stable 03/2021. He was encouraged to continue healthy lifestyle habits. Close follow up with PCP advised. He will follow up in 6 months, sooner if needed.    Orders Placed This Encounter  Procedures   IgG, IgA, IgM   CBC with Differential/Platelets   Vitamin D, 25-hydroxy     No orders of the defined types were placed in this encounter.     Shawnie Dapper, FNP-C 06/19/2022, 9:28 AM [facility] [address], [address] [place], [place] [address] [phone]

## 2022-06-13 NOTE — Patient Instructions (Signed)

## 2022-06-19 ENCOUNTER — Encounter: Payer: Self-pay | Admitting: Family Medicine

## 2022-06-19 ENCOUNTER — Ambulatory Visit: Payer: 59 | Admitting: Family Medicine

## 2022-06-19 VITALS — BP 111/67 | HR 74 | Ht 66.0 in | Wt 177.0 lb

## 2022-06-19 DIAGNOSIS — G4700 Insomnia, unspecified: Secondary | ICD-10-CM

## 2022-06-19 DIAGNOSIS — Z79899 Other long term (current) drug therapy: Secondary | ICD-10-CM

## 2022-06-19 DIAGNOSIS — R5383 Other fatigue: Secondary | ICD-10-CM | POA: Diagnosis not present

## 2022-06-19 DIAGNOSIS — G35 Multiple sclerosis: Secondary | ICD-10-CM | POA: Diagnosis not present

## 2022-06-19 DIAGNOSIS — E559 Vitamin D deficiency, unspecified: Secondary | ICD-10-CM

## 2022-06-19 DIAGNOSIS — N521 Erectile dysfunction due to diseases classified elsewhere: Secondary | ICD-10-CM

## 2022-06-20 LAB — CBC WITH DIFFERENTIAL/PLATELET
Basophils Absolute: 0 10*3/uL (ref 0.0–0.2)
Basos: 1 %
EOS (ABSOLUTE): 0.1 10*3/uL (ref 0.0–0.4)
Eos: 1 %
Hematocrit: 53.2 % — ABNORMAL HIGH (ref 37.5–51.0)
Hemoglobin: 17.3 g/dL (ref 13.0–17.7)
Immature Grans (Abs): 0 10*3/uL (ref 0.0–0.1)
Immature Granulocytes: 0 %
Lymphocytes Absolute: 0.8 10*3/uL (ref 0.7–3.1)
Lymphs: 10 %
MCH: 29 pg (ref 26.6–33.0)
MCHC: 32.5 g/dL (ref 31.5–35.7)
MCV: 89 fL (ref 79–97)
Monocytes Absolute: 0.6 10*3/uL (ref 0.1–0.9)
Monocytes: 7 %
Neutrophils Absolute: 6.7 10*3/uL (ref 1.4–7.0)
Neutrophils: 81 %
Platelets: 215 10*3/uL (ref 150–450)
RBC: 5.96 x10E6/uL — ABNORMAL HIGH (ref 4.14–5.80)
RDW: 12 % (ref 11.6–15.4)
WBC: 8.3 10*3/uL (ref 3.4–10.8)

## 2022-06-20 LAB — VITAMIN D 25 HYDROXY (VIT D DEFICIENCY, FRACTURES): Vit D, 25-Hydroxy: 50.7 ng/mL (ref 30.0–100.0)

## 2022-06-20 LAB — IGG, IGA, IGM
IgA/Immunoglobulin A, Serum: 90 mg/dL (ref 90–386)
IgG (Immunoglobin G), Serum: 784 mg/dL (ref 603–1613)
IgM (Immunoglobulin M), Srm: 28 mg/dL (ref 20–172)

## 2022-06-21 ENCOUNTER — Ambulatory Visit: Payer: 59 | Admitting: Family Medicine

## 2022-07-11 ENCOUNTER — Telehealth: Payer: Self-pay | Admitting: Family Medicine

## 2022-07-11 MED ORDER — TADALAFIL 20 MG PO TABS: 20.0000 mg | ORAL_TABLET | Freq: Every day | ORAL | 3 refills | Status: DC | PRN

## 2022-07-11 NOTE — Telephone Encounter (Signed)
Called pt back. Advised Dr. Epimenio Foot approved switch to Cialis 20mg  tablet. We will send into [facility] for him and d/c Viagra. He verbalized understanding and appreciation. E-scribed rx Cialis.

## 2022-07-11 NOTE — Telephone Encounter (Signed)
Pt called needing to discuss if his sildenafil (VIAGRA) 100 MG tablet  can be switched to Cialis. Please advise.

## 2022-08-16 ENCOUNTER — Telehealth: Payer: Self-pay | Admitting: Neurology

## 2022-08-16 ENCOUNTER — Encounter: Payer: Self-pay | Admitting: Family Medicine

## 2022-08-16 NOTE — Telephone Encounter (Signed)
Hilda Blades- can you get in touch with pt to get form that is needing completion? Thank you

## 2022-08-16 NOTE — Telephone Encounter (Signed)
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 Please advise.

## 2022-08-17 ENCOUNTER — Encounter: Payer: Self-pay | Admitting: *Deleted

## 2022-08-17 NOTE — Telephone Encounter (Signed)
Noted  

## 2022-11-15 ENCOUNTER — Telehealth: Payer: Self-pay | Admitting: *Deleted

## 2022-11-15 DIAGNOSIS — R269 Unspecified abnormalities of gait and mobility: Secondary | ICD-10-CM

## 2022-11-15 NOTE — Telephone Encounter (Signed)
Called patient. Dr Leonides Schanz does not feel that this is a clot but will order LE [place] to rule it out. Orders placed. [place] to call patient to schedule

## 2022-11-23 ENCOUNTER — Ambulatory Visit (HOSPITAL_COMMUNITY): Payer: 59

## 2022-12-12 ENCOUNTER — Ambulatory Visit: Payer: 59 | Admitting: Neurology

## 2022-12-12 ENCOUNTER — Encounter: Payer: Self-pay | Admitting: Neurology

## 2022-12-12 VITALS — BP 113/72 | HR 75 | Ht 66.0 in | Wt 178.5 lb

## 2022-12-12 DIAGNOSIS — G4700 Insomnia, unspecified: Secondary | ICD-10-CM

## 2022-12-12 DIAGNOSIS — Z79899 Other long term (current) drug therapy: Secondary | ICD-10-CM | POA: Diagnosis not present

## 2022-12-12 DIAGNOSIS — G35 Multiple sclerosis: Secondary | ICD-10-CM | POA: Diagnosis not present

## 2022-12-12 DIAGNOSIS — F988 Other specified behavioral and emotional disorders with onset usually occurring in childhood and adolescence: Secondary | ICD-10-CM

## 2022-12-12 DIAGNOSIS — R350 Frequency of micturition: Secondary | ICD-10-CM

## 2022-12-12 DIAGNOSIS — R5383 Other fatigue: Secondary | ICD-10-CM

## 2022-12-12 NOTE — Addendum Note (Signed)
Addended by: Asa Lente on: 12/12/2022 08:51 AM   Modules accepted: Orders

## 2022-12-12 NOTE — Progress Notes (Signed)
[facility]  PATIENT: Jeremy [name] DOB: [birth year]  REFERRING CLINICIAN: Burton Apley  HISTORY FROM: patient  REASON FOR VISIT: MS, needs to change therapy   HISTORICAL  CHIEF COMPLAINT:  Chief Complaint  Patient presents with   Follow-up    Pt in room 10. Here for MS follow up. Pt reports doing well. No issues or concerns.     HISTORY OF PRESENT ILLNESS:  Jeremy [name] is a 42 y.o. man with relapsing remitting multiple sclerosis.    Update 12/12/2022: He feels his MS is stable.    He is on Ocrevus and tolerates it well.  He is scheduled for his next infusion in June.   He has no exacerbation or new neurologic symptom.  No exacerbation.  Last MRI of the brain was performed 06/19/2018 and showed no new lesions.  MRI of the cervical spine in the past had shown foci at C4-C5 and C6.  IgG and IgM and IgA were fine 12/23/20  He is walking well most of the time but fatigue with longer distance.  He has occasioanl spasms in his arms.   No major weakness.    He occasionally has tingling in an arm for a few minutes in the morning.     He has some nocturia and urinary frequency.   He uses sildenafil as needed for ED.  He has some fatigue not interfering with activities.   He notes reduced focus and attention.   He feels Adderall helps and and also helps his MS fatigue.  He sleeps well most nights.  He takes Ambien  He feels mood is doing well.  He works as a Naval architect (Engineer, water)   He is usually home every day.   MS History:   He was diagnosed in January 2012.   In 2009, he had a several week episode of right hand clumsiness associated with some electric shock sensations. He did not see a doctor about the symptoms. In December 2011, he had the onset of altered taste. A week later, he had nausea and dizziness and over the next couple days these symptoms worsened and he developed right-sided clumsiness and slurred speech. First toward his PCP who felt he might have an  ear infection and he was referred to ENT. ENT evaluated him and referred him to Dr. Anne Hahn at [facility] who noted an ataxic gait and ordered an MRI of the spine and brain. Those studies were consistent with multiple sclerosis and he underwent a lumbar puncture in April 2011 that was also consistent with MS. He was started on Rebif in June 2011 and remain on the medicine until September 2014 when a repeat MRI showed the interval development of several more lesions. He was switched to [place] but insurance would no longer cover it so we switched to Gilenya early 2016.  He had elevated liver enzyme tests on Gilenya and was switched to every other day but transaminase elevation persisted.  Therefore, he switched to Ocrevus in late 2017.Marland Kitchen     MRIs from 05/25/2014 show a moderate size focus in the left temporal lobe and another 10 fairly small periventricular and deep white matter foci. MRI of the cervical spine showed 2 foci, one at C4 and one at C6. There were no enhancing lesions on those MRIs.  MRI of the brain 06/16/2015 showed no new lesions.    MRI of the brain 03/15/2016 showed no new lesions.  MRI of the cervical spine 02/08/2017 showed 2 small  foci laterally to the right at C4-C5 and laterally to the left at C6.  These were present on the previous MRI.  MRI of the brain 06/19/2018 showed no new lesions.  MRi of the brain 03/30/2021 showed Few small T2/FLAIR hyperintense foci in the periventricular, juxtacortical and deep white matter. No new lesions compared to the 06/19/2018 MRI. Though nonspecific, these are consistent with chronic demyelinating plaque associated with multiple sclerosis.  MRI of the cervical spine 03/30/2021 showed   Two small T2 hyperintense foci within the spinal cord, adjacent to C4-C5 to the right and adjacent to C6 anteriorly to the right.  Both of these were also present on the 2018 MRI and there were no new lesions.  They are consistent with chronic demyelinating plaque  associated with multiple sclerosis.    Mild disc degenerative changes at C5-C6 that do not lead to spinal stenosis or compression.  REVIEW OF SYSTEMS:  Constitutional: No fevers, chills, sweats, or change in appetite.   Occasional insomnia Eyes: No visual changes, double vision, eye pain Ear, nose and throat: No hearing loss, ear pain, nasal congestion, sore throat Cardiovascular: No chest pain, palpitations Respiratory:  No shortness of breath at rest or with exertion.   No wheezes GastrointestinaI: No nausea, vomiting, diarrhea, abdominal pain, fecal incontinence Genitourinary:  No dysuria, urinary retention.  Mild frequency and nocturia.   Has ED Musculoskeletal:  No neck pain, back pain Integumentary: No rash, pruritus, skin lesions Neurological: as above Psychiatric: No depression or anxiety Endocrine: No palpitations, diaphoresis, change in appetite, change in weigh or increased thirst Hematologic/Lymphatic:  No anemia, purpura, petechiae. Allergic/Immunologic: No itchy/runny eyes, nasal congestion, recent allergic reactions, rashes  ALLERGIES: Allergies  Allergen Reactions   Sulfa Antibiotics Nausea Only    HOME MEDICATIONS: Outpatient Medications Prior to Visit  Medication Sig Dispense Refill   amphetamine-dextroamphetamine (ADDERALL) 20 MG tablet Take 1 tablet by mouth 3 (three) times daily.     Multiple Vitamin (MULTIVITAMIN WITH MINERALS) TABS tablet Take 1 tablet by mouth daily.     ocrelizumab 600 mg in sodium chloride 0.9 % 500 mL Inject 600 mg into the vein every 6 (six) months.     ondansetron (ZOFRAN) 8 MG tablet Take 0.5 tablets (4 mg total) by mouth daily as needed for nausea or vomiting. 30 tablet 5   rosuvastatin (CRESTOR) 5 MG tablet Take 5 mg by mouth daily.     tadalafil (CIALIS) 20 MG tablet Take 1 tablet (20 mg total) by mouth daily as needed for erectile dysfunction. 30 tablet 3   zolpidem (AMBIEN) 10 MG tablet Take 10 mg by mouth at bedtime as needed for  sleep.     No facility-administered medications prior to visit.    PAST MEDICAL HISTORY: Past Medical History:  Diagnosis Date   Anemia    G6PD   Anxiety    Back pain, chronic    low   Blood transfusion without reported diagnosis    Depression    DVT (deep venous thrombosis) (HCC)    Dyslipidemia    Gait disturbance    Gynecomastia, male    Hx of dizziness    Hypothyroidism    MS (multiple sclerosis) (HCC)    probable   PONV (postoperative nausea and vomiting)    Nausea   Snoring    "mild" sleep apnea. Pt was not required to wear CPAP machine, instead he uses nasal spray   Testosterone deficiency    Vision abnormalities     PAST SURGICAL  HISTORY: Past Surgical History:  Procedure Laterality Date   BUNIONECTOMY Right    GYNECOMASTIA EXCISION     X 2   GYNECOMASTIA MASTECTOMY Bilateral 03/09/2014   Procedure: BILATERAL EXCISION GYNECOMASTIA;  Surgeon: Robyne Askew, MD;  Location: [facility] OR;  Service: General;  Laterality: Bilateral;   ULNAR NERVE REPAIR Left     FAMILY HISTORY: Family History  Problem Relation Age of Onset   Breast cancer Mother    Hypertension Father    Prostate cancer Father    Clotting disorder Father    Hyperlipidemia Father    Hypertension Brother    Hypertension Brother    Bladder Cancer Paternal Grandfather    Stroke Paternal Grandfather     SOCIAL HISTORY:  Social History   Socioeconomic History   Marital status: Single    Spouse name: Not on file   Number of children: 0   Years of education: College    Highest education level: Not on file  Occupational History   Occupation: truck driver  Tobacco Use   Smoking status: Never   Smokeless tobacco: Never  Vaping Use   Vaping Use: Never used  Substance and Sexual Activity   Alcohol use: Yes    Alcohol/week: 0.0 standard drinks of alcohol    Comment: social   Drug use: No   Sexual activity: Not on file  Other Topics Concern   Not on file  Social History Narrative    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    Patient has a 4 year college education.    Patient has no children.          Social Determinants of Health   Financial Resource Strain: Not on file  Food Insecurity: Not on file  Transportation Needs: Not on file  Physical Activity: Not on file  Stress: Not on file  Social Connections: Not on file  Intimate Partner Violence: Not on file     PHYSICAL EXAM  Vitals:   12/12/22 0805  BP: 113/72  Pulse: 75  Weight: 178 lb 8 oz (81 kg)  Height: 5\' 6"  (1.676 m)    Body mass index is 28.81 kg/m.   General: The patient is well-developed and well-nourished and in no acute distress   Neurologic Exam  Mental status: The patient is alert and oriented x 3 at the time of the examination. The patient has apparent normal recent and remote memory, with an apparently normal attention span and concentration ability.   Speech is normal.  Cranial nerves: Extraocular movements are full.  Facial strength and sensation is normal.  Trapezius strength is normal.  No obvious hearing deficits are noted.  Motor:  Muscle bulk and tone are normal. Strength is  5 / 5 in all 4 extremities.   Sensory: Sensory testing is intact to touch and vibration in the arms and legs.  Coordination: Cerebellar testing reveals mildly reduced right finger-nose-finger and heel-to-shin.  Gait and station: Station is normal. Gait is normal and tandem is near normal  Romberg is negative.  Reflexes: Deep tendon reflexes are 2 at arms and ankles and 3 at knees.  There is no ankle clonus.  .      _________________________________________________   Relapsing remitting multiple sclerosis (HCC) - Plan: IgG, IgA, IgM, CD20 B Cells  High risk medication use - Plan: IgG, IgA, IgM, CD20 B Cells  Other fatigue  Insomnia, unspecified type  Urinary frequency  Long-term current use of stimulant - Plan: POC Drug  Screen, Urine   1.   His MS has done well on Ocrevus.  He will  continue.  Check IgG/IgM and CD19/20 today.   2.   Around the time of the next visit, we will check an MRI of the brain and cervical spine to determine if there has been any subclinical progression.  If this is occurring we would need to consider a different disease modifying therapy.  Also check IgG/IgM 1-2 times a year.    2.   Adderall for MS fatigue and ADD.   UDS annualy 3.   Stay active and exercise as tolerated.  Take OTC vitamin D. 4.   Return in 6 months or sooner if there are new or worsening neurologic symptoms.  S Glendon Fiser A. Epimenio Foot, MD, PhD 12/12/2022, 8:38 AM Certified in Neurology, Clinical Neurophysiology, Sleep Medicine, Pain Medicine and Neuroimaging  [facility] [address], [address] [place], [place] [address] [phone]

## 2022-12-13 LAB — DRUG SCREEN, UR (12+OXYCODONE+CRT)
Amphetamine Scrn, Ur: NEGATIVE ng/mL
BARBITURATE SCREEN URINE: NEGATIVE ng/mL
BENZODIAZEPINE SCREEN, URINE: NEGATIVE ng/mL
CANNABINOIDS UR QL SCN: NEGATIVE ng/mL
Cocaine (Metab) Scrn, Ur: NEGATIVE ng/mL
Creatinine(Crt), U: 90.1 mg/dL (ref 20.0–300.0)
Fentanyl, Urine: NEGATIVE pg/mL
Meperidine Screen, Urine: NEGATIVE ng/mL
Methadone Screen, Urine: NEGATIVE ng/mL
OXYCODONE+OXYMORPHONE UR QL SCN: NEGATIVE ng/mL
Opiate Scrn, Ur: NEGATIVE ng/mL
Ph of Urine: 6.4 (ref 4.5–8.9)
Phencyclidine Qn, Ur: NEGATIVE ng/mL
Propoxyphene Scrn, Ur: NEGATIVE ng/mL
SPECIFIC GRAVITY: 1.013
Tramadol Screen, Urine: NEGATIVE ng/mL

## 2022-12-13 LAB — CD20 B CELLS

## 2022-12-14 LAB — IGG, IGA, IGM
IgA/Immunoglobulin A, Serum: 79 mg/dL — ABNORMAL LOW (ref 90–386)
IgG (Immunoglobin G), Serum: 782 mg/dL (ref 603–1613)
IgM (Immunoglobulin M), Srm: 23 mg/dL (ref 20–172)

## 2023-06-13 NOTE — Progress Notes (Deleted)
PATIENT: Jeremy [name] DOB: 1980/10/11  REASON FOR VISIT: follow up HISTORY FROM: patient  No chief complaint on file.    HISTORY OF PRESENT ILLNESS:  06/13/23 ALL:  He returns for follow up for RRMS. He continues Ocrevus infusions, last infusion ??, next scheduled for ??. Labs stable 11/2022. MRI stable 03/2021.   He is feeling well, today. No new or exacerbating symptoms. Gait is stable. No significant pain. He is exercising regularly. No vision changes. No difficulty with bowel or bladder functioning.   Adderall 20mg  TID helps with inattention/MS fatigue (prescribed by PCP). He takes this consistently. Insomnia is stable. He takes Ambien (prescribed by PCP) most nights. He is a full time truck driver. He has a fairly consistent schedule. Mood is good. Sildenafil helps with ED. He does take vitamin D 2,000iu daily.   He has not had any recurrence of DVT.    HISTORY: (copied from Dr Bonnita Hollow previous note)  Jeremy [name] is a 42 y.o. man with relapsing remitting multiple sclerosis.     Update 12/12/2022: He feels his MS is stable.    He is on Ocrevus and tolerates it well.  He is scheduled for his next infusion in June.   He has no exacerbation or new neurologic symptom.  No exacerbation.  Last MRI of the brain was performed 06/19/2018 and showed no new lesions.  MRI of the cervical spine in the past had shown foci at C4-C5 and C6.   IgG and IgM and IgA were fine 12/23/20   He is walking well most of the time but fatigue with longer distance.  He has occasioanl spasms in his arms.   No major weakness.    He occasionally has tingling in an arm for a few minutes in the morning.      He has some nocturia and urinary frequency.   He uses sildenafil as needed for ED.   He has some fatigue not interfering with activities.   He notes reduced focus and attention.   He feels Adderall helps and and also helps his MS fatigue.  He sleeps well most nights.  He takes Ambien  He feels mood is doing  well.   He works as a Naval architect (Engineer, water)   He is usually home every day.   MS History:   He was diagnosed in January 2012.   In 2009, he had a several week episode of right hand clumsiness associated with some electric shock sensations. He did not see a doctor about the symptoms. In December 2011, he had the onset of altered taste. A week later, he had nausea and dizziness and over the next couple days these symptoms worsened and he developed right-sided clumsiness and slurred speech. First toward his PCP who felt he might have an ear infection and he was referred to ENT. ENT evaluated him and referred him to Dr. Anne Hahn at [facility] who noted an ataxic gait and ordered an MRI of the spine and brain. Those studies were consistent with multiple sclerosis and he underwent a lumbar puncture in April 2011 that was also consistent with MS. He was started on Rebif in June 2011 and remain on the medicine until September 2014 when a repeat MRI showed the interval development of several more lesions. He was switched to [place] but insurance would no longer cover it so we switched to Gilenya early 2016.  He had elevated liver enzyme tests on Gilenya and was switched to every other  day but transaminase elevation persisted.  Therefore, he switched to Ocrevus in late 2017.Marland Kitchen      MRIs from 05/25/2014 show a moderate size focus in the left temporal lobe and another 10 fairly small periventricular and deep white matter foci. MRI of the cervical spine showed 2 foci, one at C4 and one at C6. There were no enhancing lesions on those MRIs.   MRI of the brain 06/16/2015 showed no new lesions.     MRI of the brain 03/15/2016 showed no new lesions.  MRI of the cervical spine 02/08/2017 showed 2 small foci laterally to the right at C4-C5 and laterally to the left at C6.  These were present on the previous MRI.   MRI of the brain 06/19/2018 showed no new lesions.   MRi of the brain 03/30/2021 showed Few small  T2/FLAIR hyperintense foci in the periventricular, juxtacortical and deep white matter. No new lesions compared to the 06/19/2018 MRI. Though nonspecific, these are consistent with chronic demyelinating plaque associated with multiple sclerosis.   MRI of the cervical spine 03/30/2021 showed   Two small T2 hyperintense foci within the spinal cord, adjacent to C4-C5 to the right and adjacent to C6 anteriorly to the right.  Both of these were also present on the 2018 MRI and there were no new lesions.  They are consistent with chronic demyelinating plaque associated with multiple sclerosis.    Mild disc degenerative changes at C5-C6 that do not lead to spinal stenosis or compression.  REVIEW OF SYSTEMS: Out of a complete 14 system review of symptoms, the patient complains only of the following symptoms, erectile dysfunction, insomnia, inattention, fatigue and all other reviewed systems are negative.   ALLERGIES: Allergies  Allergen Reactions   Sulfa Antibiotics Nausea Only    HOME MEDICATIONS: Outpatient Medications Prior to Visit  Medication Sig Dispense Refill   amphetamine-dextroamphetamine (ADDERALL) 20 MG tablet Take 1 tablet by mouth 3 (three) times daily.     Multiple Vitamin (MULTIVITAMIN WITH MINERALS) TABS tablet Take 1 tablet by mouth daily.     ocrelizumab 600 mg in sodium chloride 0.9 % 500 mL Inject 600 mg into the vein every 6 (six) months.     ondansetron (ZOFRAN) 8 MG tablet Take 0.5 tablets (4 mg total) by mouth daily as needed for nausea or vomiting. 30 tablet 5   rosuvastatin (CRESTOR) 5 MG tablet Take 5 mg by mouth daily.     tadalafil (CIALIS) 20 MG tablet Take 1 tablet (20 mg total) by mouth daily as needed for erectile dysfunction. 30 tablet 3   zolpidem (AMBIEN) 10 MG tablet Take 10 mg by mouth at bedtime as needed for sleep.     No facility-administered medications prior to visit.    PAST MEDICAL HISTORY: Past Medical History:  Diagnosis Date   Anemia    G6PD    Anxiety    Back pain, chronic    low   Blood transfusion without reported diagnosis    Depression    DVT (deep venous thrombosis) (HCC)    Dyslipidemia    Gait disturbance    Gynecomastia, male    Hx of dizziness    Hypothyroidism    MS (multiple sclerosis) (HCC)    probable   PONV (postoperative nausea and vomiting)    Nausea   Snoring    "mild" sleep apnea. Pt was not required to wear CPAP machine, instead he uses nasal spray   Testosterone deficiency    Vision abnormalities  PAST SURGICAL HISTORY: Past Surgical History:  Procedure Laterality Date   BUNIONECTOMY Right    GYNECOMASTIA EXCISION     X 2   GYNECOMASTIA MASTECTOMY Bilateral 03/09/2014   Procedure: BILATERAL EXCISION GYNECOMASTIA;  Surgeon: Robyne Askew, MD;  Location: [facility] OR;  Service: General;  Laterality: Bilateral;   ULNAR NERVE REPAIR Left     FAMILY HISTORY: Family History  Problem Relation Age of Onset   Breast cancer Mother    Hypertension Father    Prostate cancer Father    Clotting disorder Father    Hyperlipidemia Father    Hypertension Brother    Hypertension Brother    Bladder Cancer Paternal Grandfather    Stroke Paternal Grandfather     SOCIAL HISTORY: Social History   Socioeconomic History   Marital status: Single    Spouse name: Not on file   Number of children: 0   Years of education: College    Highest education level: Not on file  Occupational History   Occupation: truck driver  Tobacco Use   Smoking status: Never   Smokeless tobacco: Never  Vaping Use   Vaping status: Never Used  Substance and Sexual Activity   Alcohol use: Yes    Alcohol/week: 0.0 standard drinks of alcohol    Comment: social   Drug use: No   Sexual activity: Not on file  Other Topics Concern   Not on file  Social History Narrative   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    Patient has a 4 year college education.    Patient has no children.          Social Determinants of Health    Financial Resource Strain: Not on file  Food Insecurity: Not on file  Transportation Needs: Not on file  Physical Activity: Not on file  Stress: Not on file  Social Connections: Unknown (11/25/2021)   Received from [facility], [facility]   Social Network    Social Network: Not on file  Intimate Partner Violence: Unknown (10/24/2021)   Received from [facility], [facility]   HITS    Physically Hurt: Not on file    Insult or Talk Down To: Not on file    Threaten Physical Harm: Not on file    Scream or Curse: Not on file      PHYSICAL EXAM  There were no vitals filed for this visit.    There is no height or weight on file to calculate BMI.  Generalized: Well developed, in no acute distress  Cardiology: normal rate and rhythm, no murmur noted Respiratory: clear to auscultation bilaterally  Neurological examination  Mentation: Alert oriented to time, place, history taking. Follows all commands speech and language fluent Cranial nerve II-XII: Pupils were equal round reactive to light. Extraocular movements were full, visual field were full on confrontational test. Facial sensation and strength were normal. Uvula tongue midline. Head turning and shoulder shrug  were normal and symmetric. Motor: The motor testing reveals 5 over 5 strength of all 4 extremities. Good symmetric motor tone is noted throughout.  Sensory: Sensory testing is intact to soft touch on all 4 extremities. No evidence of extinction is noted.  Coordination: Cerebellar testing reveals good finger-nose-finger and heel-to-shin bilaterally.  Gait and station: Gait is normal.  Reflexes: Deep tendon reflexes are symmetric and normal bilaterally.   DIAGNOSTIC DATA (LABS, IMAGING, TESTING) - I reviewed patient records, labs, notes, testing and imaging myself where available.  No data to display           Lab Results  Component Value Date   WBC 8.3 06/19/2022   HGB 17.3 06/19/2022   HCT 53.2 (H)  06/19/2022   MCV 89 06/19/2022   PLT 215 06/19/2022      Component Value Date/Time   NA 141 09/20/2020 0927   K 5.0 09/20/2020 0927   CL 104 09/20/2020 0927   CO2 20 09/20/2020 0927   GLUCOSE 90 09/20/2020 0927   GLUCOSE 87 01/16/2020 0928   BUN 16 09/20/2020 0927   CREATININE 1.00 09/20/2020 0927   CREATININE 1.06 01/16/2020 0928   CALCIUM 9.8 09/20/2020 0927   PROT 7.0 09/20/2020 0927   ALBUMIN 4.9 09/20/2020 0927   AST 29 09/20/2020 0927   AST 25 01/16/2020 0928   ALT 46 (H) 09/20/2020 0927   ALT 49 (H) 01/16/2020 0928   ALKPHOS 68 09/20/2020 0927   BILITOT 0.9 09/20/2020 0927   BILITOT 0.5 01/16/2020 0928   GFRNONAA >60 01/16/2020 0928   GFRAA >60 01/16/2020 0928   No results found for: "CHOL", "HDL", "LDLCALC", "LDLDIRECT", "TRIG", "CHOLHDL" No results found for: "HGBA1C" No results found for: "VITAMINB12" No results found for: "TSH"   ASSESSMENT AND PLAN 42 y.o. year old male  has a past medical history of Anemia, Anxiety, Back pain, chronic, Blood transfusion without reported diagnosis, Depression, DVT (deep venous thrombosis) (HCC), Dyslipidemia, Gait disturbance, Gynecomastia, male, dizziness, Hypothyroidism, MS (multiple sclerosis) (HCC), PONV (postoperative nausea and vomiting), Snoring, Testosterone deficiency, and Vision abnormalities. here with   No diagnosis found.    Owyn is doing well. He continues to tolerate Ocrevus. We will continue current treatment plan. We will update labs, today. MRI stable 03/2021. He was encouraged to continue healthy lifestyle habits. Close follow up with PCP advised. He will follow up in 6 months, sooner if needed.    No orders of the defined types were placed in this encounter.    No orders of the defined types were placed in this encounter.     Shawnie Dapper, FNP-C 06/13/2023, 10:49 AM [facility] [address] [place], [place] [address] [phone]

## 2023-06-18 ENCOUNTER — Ambulatory Visit: Payer: 59 | Admitting: Family Medicine

## 2023-08-01 ENCOUNTER — Telehealth: Payer: Self-pay | Admitting: Neurology

## 2023-08-01 MED ORDER — TADALAFIL 20 MG PO TABS: 20.0000 mg | ORAL_TABLET | Freq: Every day | ORAL | 3 refills | Status: DC | PRN

## 2023-08-01 NOTE — Telephone Encounter (Signed)
 Pt requesting refill of tadalafil (CIALIS) 20 MG tablet Send to [facility] [phone]

## 2023-08-01 NOTE — Telephone Encounter (Signed)
 Refilled tadalafil to [facility] as requested.

## 2023-08-13 ENCOUNTER — Telehealth: Payer: Self-pay | Admitting: Neurology

## 2023-08-13 ENCOUNTER — Encounter: Payer: Self-pay | Admitting: Neurology

## 2023-08-13 ENCOUNTER — Encounter: Payer: Self-pay | Admitting: *Deleted

## 2023-08-13 NOTE — Telephone Encounter (Signed)
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 Requesting call when it is ready

## 2023-08-13 NOTE — Telephone Encounter (Signed)
Letter printer, MD signed. Called pt. He will pick up. Placed up front for pick up.

## 2023-08-13 NOTE — Telephone Encounter (Signed)
Last seen 12/12/22. Next f/u 01/21/24.   Dr. Epimenio Foot- ok to provide letter? This is previous one written:

## 2023-09-26 ENCOUNTER — Ambulatory Visit: Payer: 59 | Admitting: Family Medicine

## 2023-12-03 NOTE — Progress Notes (Deleted)
 PATIENT: Jeremy [name] DOB: [birth year]  REASON FOR VISIT: follow up HISTORY FROM: patient  No chief complaint on file.    HISTORY OF PRESENT ILLNESS:  12/03/23 ALL:  He returns for follow up for RRMS. He continues Ocrevus  infusions. Labs stable 11/2022. MRI stable 03/2021.   He is feeling well, today. No new or exacerbating symptoms. Gait is stable. No significant pain. He is exercising regularly. No vision changes. No difficulty with bowel or bladder functioning.   Adderall 20mg  TID helps with inattention/MS fatigue (prescribed by PCP). He takes this consistently. Insomnia is stable. He takes Ambien (prescribed by PCP) most nights. He is a full time truck driver. He has a fairly consistent schedule. Mood is good. Sildenafil  helps with ED. He does take vitamin D  2,000iu daily.   He has not had any recurrence of DVT.    Cialis ?   HISTORY: (copied from Dr Thom Fleeting previous note)  Jeremy [name] is a 43 y.o. man with relapsing remitting multiple sclerosis.     Update 12/12/2022: He feels his MS is stable.    He is on Ocrevus  and tolerates it well.  He is scheduled for his next infusion in June.   He has no exacerbation or new neurologic symptom.  No exacerbation.  Last MRI of the brain was performed 06/19/2018 and showed no new lesions.  MRI of the cervical spine in the past had shown foci at C4-C5 and C6.   IgG and IgM and IgA were fine 12/23/20   He is walking well most of the time but fatigue with longer distance.  He has occasioanl spasms in his arms.   No major weakness.    He occasionally has tingling in an arm for a few minutes in the morning.      He has some nocturia and urinary frequency.   He uses sildenafil  as needed for ED.   He has some fatigue not interfering with activities.   He notes reduced focus and attention.   He feels Adderall helps and and also helps his MS fatigue.  He sleeps well most nights.  He takes Ambien  He feels mood is doing well.   He works as a Ecologist (Engineer, water)   He is usually home every day.     MS History:   He was diagnosed in January 2012.   In 2009, he had a several week episode of right hand clumsiness associated with some electric shock sensations. He did not see a doctor about the symptoms. In December 2011, he had the onset of altered taste. A week later, he had nausea and dizziness and over the next couple days these symptoms worsened and he developed right-sided clumsiness and slurred speech. First toward his PCP who felt he might have an ear infection and he was referred to ENT. ENT evaluated him and referred him to Dr. Tilda Fogo at [facility] who noted an ataxic gait and ordered an MRI of the spine and brain. Those studies were consistent with multiple sclerosis and he underwent a lumbar puncture in April 2011 that was also consistent with MS. He was started on Rebif  in June 2011 and remain on the medicine until September 2014 when a repeat MRI showed the interval development of several more lesions. He was switched to [place]  but insurance would no longer cover it so we switched to Gilenya  early 2016.  He had elevated liver enzyme tests on Gil[place]  and was switched to every other day but  transaminase elevation persisted.  Therefore, he switched to Ocrevus  in late 2017.Jeremy [name]      MRIs from 05/25/2014 show a moderate size focus in the left temporal lobe and another 10 fairly small periventricular and deep white matter foci. MRI of the cervical spine showed 2 foci, one at C4 and one at C6. There were no enhancing lesions on those MRIs.   MRI of the brain 06/16/2015 showed no new lesions.     MRI of the brain 03/15/2016 showed no new lesions.  MRI of the cervical spine 02/08/2017 showed 2 small foci laterally to the right at C4-C5 and laterally to the left at C6.  These were present on the previous MRI.   MRI of the brain 06/19/2018 showed no new lesions.   MRi of the brain 03/30/2021 showed Few small T2/FLAIR hyperintense foci in  the periventricular, juxtacortical and deep white matter. No new lesions compared to the 06/19/2018 MRI. Though nonspecific, these are consistent with chronic demyelinating plaque associated with multiple sclerosis.   MRI of the cervical spine 03/30/2021 showed   Two small T2 hyperintense foci within the spinal cord, adjacent to C4-C5 to the right and adjacent to C6 anteriorly to the right.  Both of these were also present on the 2018 MRI and there were no new lesions.  They are consistent with chronic demyelinating plaque associated with multiple sclerosis.    Mild disc degenerative changes at C5-C6 that do not lead to spinal stenosis or compression.   REVIEW OF SYSTEMS: Out of a complete 14 system review of symptoms, the patient complains only of the following symptoms, erectile dysfunction, insomnia, inattention, fatigue and all other reviewed systems are negative.   ALLERGIES: Allergies  Allergen Reactions   Sulfa Antibiotics Nausea Only    HOME MEDICATIONS: Outpatient Medications Prior to Visit  Medication Sig Dispense Refill   amphetamine -dextroamphetamine (ADDERALL) 20 MG tablet Take 1 tablet by mouth 3 (three) times daily.     Multiple Vitamin (MULTIVITAMIN WITH MINERALS) TABS tablet Take 1 tablet by mouth daily.     ocrelizumab  600 mg in sodium chloride  0.9 % 500 mL Inject 600 mg into the vein every 6 (six) months.     ondansetron  (ZOFRAN ) 8 MG tablet Take 0.5 tablets (4 mg total) by mouth daily as needed for nausea or vomiting. 30 tablet 5   rosuvastatin (CRESTOR) 5 MG tablet Take 5 mg by mouth daily.     tadalafil  (CIALIS ) 20 MG tablet Take 1 tablet (20 mg total) by mouth daily as needed for erectile dysfunction. 30 tablet 3   zolpidem (AMBIEN) 10 MG tablet Take 10 mg by mouth at bedtime as needed for sleep.     No facility-administered medications prior to visit.    PAST MEDICAL HISTORY: Past Medical History:  Diagnosis Date   Anemia    G6PD   Anxiety    Back pain, chronic     low   Blood transfusion without reported diagnosis    Depression    DVT (deep venous thrombosis) (HCC)    Dyslipidemia    Gait disturbance    Gynecomastia, male    Hx of dizziness    Hypothyroidism    MS (multiple sclerosis) (HCC)    probable   PONV (postoperative nausea and vomiting)    Nausea   Snoring    "mild" sleep apnea. Pt was not required to wear CPAP machine, instead he uses nasal spray   Testosterone  deficiency    Vision abnormalities  PAST SURGICAL HISTORY: Past Surgical History:  Procedure Laterality Date   BUNIONECTOMY Right    GYNECOMASTIA EXCISION     X 2   GYNECOMASTIA MASTECTOMY Bilateral 03/09/2014   Procedure: BILATERAL EXCISION GYNECOMASTIA;  Surgeon: Mayme Spearman, MD;  Location: [facility] OR;  Service: General;  Laterality: Bilateral;   ULNAR NERVE REPAIR Left     FAMILY HISTORY: Family History  Problem Relation Age of Onset   Breast cancer Mother    Hypertension Father    Prostate cancer Father    Clotting disorder Father    Hyperlipidemia Father    Hypertension Brother    Hypertension Brother    Bladder Cancer Paternal Grandfather    Stroke Paternal Grandfather     SOCIAL HISTORY: Social History   Socioeconomic History   Marital status: Single    Spouse name: Not on file   Number of children: 0   Years of education: College    Highest education level: Not on file  Occupational History   Occupation: truck driver  Tobacco Use   Smoking status: Never   Smokeless tobacco: Never  Vaping Use   Vaping status: Never Used  Substance and Sexual Activity   Alcohol use: Yes    Alcohol/week: 0.0 standard drinks of alcohol    Comment: social   Drug use: No   Sexual activity: Not on file  Other Topics Concern   Not on file  Social History Narrative   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    Patient has a 4 year college education.    Patient has no children.          Social Drivers of Corporate investment banker Strain: Not on  file  Food Insecurity: Not on file  Transportation Needs: Not on file  Physical Activity: Not on file  Stress: Not on file  Social Connections: Unknown (11/25/2021)   Received from [facility]   Social Network    Social Network: Not on file  Intimate Partner Violence: Unknown (10/24/2021)   Received from [facility]   HITS    Physically Hurt: Not on file    Insult or Talk Down To: Not on file    Threaten Physical Harm: Not on file    Scream or Curse: Not on file      PHYSICAL EXAM  There were no vitals filed for this visit.    There is no height or weight on file to calculate BMI.  Generalized: Well developed, in no acute distress  Cardiology: normal rate and rhythm, no murmur noted Respiratory: clear to auscultation bilaterally  Neurological examination  Mentation: Alert oriented to time, place, history taking. Follows all commands speech and language fluent Cranial nerve II-XII: Pupils were equal round reactive to light. Extraocular movements were full, visual field were full on confrontational test. Facial sensation and strength were normal. Uvula tongue midline. Head turning and shoulder shrug  were normal and symmetric. Motor: The motor testing reveals 5 over 5 strength of all 4 extremities. Good symmetric motor tone is noted throughout.  Sensory: Sensory testing is intact to soft touch on all 4 extremities. No evidence of extinction is noted.  Coordination: Cerebellar testing reveals good finger-nose-finger and heel-to-shin bilaterally.  Gait and station: Gait is normal.  Reflexes: Deep tendon reflexes are symmetric and normal bilaterally.   DIAGNOSTIC DATA (LABS, IMAGING, TESTING) - I reviewed patient records, labs, notes, testing and imaging myself where available.  No data to display           Lab Results  Component Value Date   WBC 8.3 06/19/2022   HGB 17.3 06/19/2022   HCT 53.2 (H) 06/19/2022   MCV 89 06/19/2022    PLT 215 06/19/2022      Component Value Date/Time   NA 141 09/20/2020 0927   K 5.0 09/20/2020 0927   CL 104 09/20/2020 0927   CO2 20 09/20/2020 0927   GLUCOSE 90 09/20/2020 0927   GLUCOSE 87 01/16/2020 0928   BUN 16 09/20/2020 0927   CREATININE 1.00 09/20/2020 0927   CREATININE 1.06 01/16/2020 0928   CALCIUM 9.8 09/20/2020 0927   PROT 7.0 09/20/2020 0927   ALBUMIN 4.9 09/20/2020 0927   AST 29 09/20/2020 0927   AST 25 01/16/2020 0928   ALT 46 (H) 09/20/2020 0927   ALT 49 (H) 01/16/2020 0928   ALKPHOS 68 09/20/2020 0927   BILITOT 0.9 09/20/2020 0927   BILITOT 0.5 01/16/2020 0928   GFRNONAA >60 01/16/2020 0928   GFRAA >60 01/16/2020 0928   No results found for: "CHOL", "HDL", "LDLCALC", "LDLDIRECT", "TRIG", "CHOLHDL" No results found for: "HGBA1C" No results found for: "VITAMINB12" No results found for: "TSH"   ASSESSMENT AND PLAN 43 y.o. year old male  has a past medical history of Anemia, Anxiety, Back pain, chronic, Blood transfusion without reported diagnosis, Depression, DVT (deep venous thrombosis) (HCC), Dyslipidemia, Gait disturbance, Gynecomastia, male, dizziness, Hypothyroidism, MS (multiple sclerosis) (HCC), PONV (postoperative nausea and vomiting), Snoring, Testosterone  deficiency, and Vision abnormalities. here with   No diagnosis found.     Colleen is doing well. He continues to tolerate Ocrevus . We will continue current treatment plan. We will update labs, today. MRI stable 03/2021. He was encouraged to continue healthy lifestyle habits. Close follow up with PCP advised. He will follow up in 6 months, sooner if needed.    No orders of the defined types were placed in this encounter.    No orders of the defined types were placed in this encounter.     Terrilyn Fick, FNP-C 12/03/2023, 11:28 AM [facility] [address] [place], [place] [address] [phone]

## 2023-12-03 NOTE — Patient Instructions (Incomplete)
Below is our plan:  We will continue current treatment plan.   Please make sure you are staying well hydrated. I recommend 50-60 ounces daily. Well balanced diet and regular exercise encouraged. Consistent sleep schedule with 6-8 hours recommended.   Please continue follow up with care team as directed.   Follow up with Dr Sater in 6 months   You may receive a survey regarding today's visit. I encourage you to leave honest feed back as I do use this information to improve patient care. Thank you for seeing me today!    

## 2023-12-07 ENCOUNTER — Ambulatory Visit: Admitting: Family Medicine

## 2023-12-07 DIAGNOSIS — G35 Multiple sclerosis: Secondary | ICD-10-CM

## 2023-12-07 DIAGNOSIS — R350 Frequency of micturition: Secondary | ICD-10-CM

## 2023-12-07 DIAGNOSIS — Z79899 Other long term (current) drug therapy: Secondary | ICD-10-CM

## 2023-12-07 DIAGNOSIS — G4700 Insomnia, unspecified: Secondary | ICD-10-CM

## 2023-12-07 DIAGNOSIS — E559 Vitamin D deficiency, unspecified: Secondary | ICD-10-CM

## 2023-12-07 DIAGNOSIS — N521 Erectile dysfunction due to diseases classified elsewhere: Secondary | ICD-10-CM

## 2023-12-07 DIAGNOSIS — F988 Other specified behavioral and emotional disorders with onset usually occurring in childhood and adolescence: Secondary | ICD-10-CM

## 2023-12-07 DIAGNOSIS — R5383 Other fatigue: Secondary | ICD-10-CM

## 2023-12-10 ENCOUNTER — Telehealth: Payer: Self-pay | Admitting: *Deleted

## 2023-12-10 NOTE — Telephone Encounter (Signed)
 Pt said spoke with nurse to schedule appointment on 01/21/24. Amy has on that day no appt available due to will be on vacation. Pt need a phone call to confirm which date was told could schedule before infusion .

## 2023-12-10 NOTE — Telephone Encounter (Signed)
 Called pt back and got him scheduled with Dr. Godwin Lat 01/21/2024 @ 2:00pm.

## 2023-12-10 NOTE — Telephone Encounter (Signed)
 Pt returned called , Informed Pt new Appt ,Pt states that He needs an Monday Appt. Transfer to Nurse

## 2023-12-10 NOTE — Telephone Encounter (Signed)
 LVM for pt to call about appt. Per Holly/[facility], he needs sooner appt.  If he calls, please offer 12/26/23 at 830a with Amy Lomax,NP. Appt on hold.

## 2024-01-21 ENCOUNTER — Ambulatory Visit: Admitting: Neurology

## 2024-01-21 ENCOUNTER — Encounter: Payer: Self-pay | Admitting: Neurology

## 2024-01-21 ENCOUNTER — Ambulatory Visit: Payer: 59 | Admitting: Family Medicine

## 2024-01-21 VITALS — BP 119/74 | HR 90 | Ht 66.0 in | Wt 178.5 lb

## 2024-01-21 DIAGNOSIS — R350 Frequency of micturition: Secondary | ICD-10-CM

## 2024-01-21 DIAGNOSIS — Z79899 Other long term (current) drug therapy: Secondary | ICD-10-CM | POA: Diagnosis not present

## 2024-01-21 DIAGNOSIS — E782 Mixed hyperlipidemia: Secondary | ICD-10-CM

## 2024-01-21 DIAGNOSIS — G35 Multiple sclerosis: Secondary | ICD-10-CM

## 2024-01-21 DIAGNOSIS — G4700 Insomnia, unspecified: Secondary | ICD-10-CM | POA: Diagnosis not present

## 2024-01-21 DIAGNOSIS — R5383 Other fatigue: Secondary | ICD-10-CM | POA: Diagnosis not present

## 2024-01-21 NOTE — Progress Notes (Signed)
 [facility]  PATIENT: Jeremy [name] DOB: 1981-01-21  REFERRING CLINICIAN: Tanda Rummer  HISTORY FROM: patient    REASON FOR VISIT: MS, needs to change therapy   HISTORICAL  CHIEF COMPLAINT:  Chief Complaint  Patient presents with   RM10/MS    Pt is here Alone. Pt states that he has been stable since last appointment. Pt is on Ocrevus  and tolerating well. Pt would like to get his Cholesterol checked today.      HISTORY OF PRESENT ILLNESS:  Jeremy [name] is a 43 y.o. man with relapsing remitting multiple sclerosis.    Update 01/21/2024: He continues to do well and feels his MS is stable.    He is on Ocrevus  (since 2017) and tolerates it well.  He is scheduled for his next infusion in 2 weeks (mid July 2025) and last one in [place] 2024.   He has no exacerbation or new neurologic symptom.  No exacerbation.  Last MRI of the brain was performed 03/30/2021 and and showed no new lesions compared to the previous one in 2019.  MRI of the cervical spine in the past had shown foci at C4-C5 and C6.  IgG and IgM and IgA were fine 12/23/20  He does 30+ minutes exercise daily.  He is walking well most of the time but fatigue with longer distance.  He has occasioanl spasms in his arms.   No major weakness.    He occasionally has tingling in an arm for a few minutes in the morning.     Vision is doing well.    He has some nocturia and urinary frequency.   He uses sildenafil  as needed for ED.  He has some fatigue not interfering with activities.   He notes reduced focus and attention.   He feels Adderall helps and and also helps his MS fatigue.  He sleeps well most nights.  He takes Ambien with benefot but tstill has some nights that he wakes up.    He feels mood is doing well.  He works as a Naval architect (Engineer, water)   He is usually home every day.  He had T chol > 300 and was first placed on Crestor and then switched to Lipitor as he had myalgias.   MS History:   He was  diagnosed in January 2012.   In 2009, he had a several week episode of right hand clumsiness associated with some electric shock sensations. He did not see a doctor about the symptoms. In December 2011, he had the onset of altered taste. A week later, he had nausea and dizziness and over the next couple days these symptoms worsened and he developed right-sided clumsiness and slurred speech. First toward his PCP who felt he might have an ear infection and he was referred to ENT. ENT evaluated him and referred him to Dr. Jenel at [facility] who noted an ataxic gait and ordered an MRI of the spine and brain. Those studies were consistent with multiple sclerosis and he underwent a lumbar puncture in April 2011 that was also consistent with MS. He was started on Rebif  in June 2011 and remain on the medicine until September 2014 when a repeat MRI showed the interval development of several more lesions. He was switched to Tecfidera  but insurance would no longer cover it so we switched to Gilenya  early 2016.  He had elevated liver enzyme tests on Gilenya  and was switched to every other day but transaminase elevation persisted.  Therefore,  he switched to Ocrevus  in late 2017.SABRA     MRIs from 05/25/2014 show a moderate size focus in the left temporal lobe and another 10 fairly small periventricular and deep white matter foci. MRI of the cervical spine showed 2 foci, one at C4 and one at C6. There were no enhancing lesions on those MRIs.  MRI of the brain 06/16/2015 showed no new lesions.    MRI of the brain 03/15/2016 showed no new lesions.  MRI of the cervical spine 02/08/2017 showed 2 small foci laterally to the right at C4-C5 and laterally to the left at C6.  These were present on the previous MRI.  MRI of the brain 06/19/2018 showed no new lesions.  MRi of the brain 03/30/2021 showed Few small T2/FLAIR hyperintense foci in the periventricular, juxtacortical and deep white matter. No new lesions compared to  the 06/19/2018 MRI. Though nonspecific, these are consistent with chronic demyelinating plaque associated with multiple sclerosis.  MRI of the cervical spine 03/30/2021 showed   Two small T2 hyperintense foci within the spinal cord, adjacent to C4-C5 to the right and adjacent to C6 anteriorly to the right.  Both of these were also present on the 2018 MRI and there were no new lesions.  They are consistent with chronic demyelinating plaque associated with multiple sclerosis.    Mild disc degenerative changes at C5-C6 that do not lead to spinal stenosis or compression.  REVIEW OF SYSTEMS:  Constitutional: No fevers, chills, sweats, or change in appetite.   Occasional insomnia Eyes: No visual changes, double vision, eye pain Ear, nose and throat: No hearing loss, ear pain, nasal congestion, sore throat Cardiovascular: No chest pain, palpitations Respiratory:  No shortness of breath at rest or with exertion.   No wheezes GastrointestinaI: No nausea, vomiting, diarrhea, abdominal pain, fecal incontinence Genitourinary:  No dysuria, urinary retention.  Mild frequency and nocturia.   Has ED Musculoskeletal:  No neck pain, back pain Integumentary: No rash, pruritus, skin lesions Neurological: as above Psychiatric: No depression or anxiety Endocrine: No palpitations, diaphoresis, change in appetite, change in weigh or increased thirst Hematologic/Lymphatic:  No anemia, purpura, petechiae. Allergic/Immunologic: No itchy/runny eyes, nasal congestion, recent allergic reactions, rashes  ALLERGIES: Allergies  Allergen Reactions   Sulfa Antibiotics Nausea Only    HOME MEDICATIONS: Outpatient Medications Prior to Visit  Medication Sig Dispense Refill   amphetamine -dextroamphetamine (ADDERALL) 20 MG tablet Take 1 tablet by mouth 3 (three) times daily.     atorvastatin (LIPITOR) 20 MG tablet Take 20 mg by mouth daily.     Multiple Vitamin (MULTIVITAMIN WITH MINERALS) TABS tablet Take 1 tablet by mouth  daily.     ocrelizumab  600 mg in sodium chloride  0.9 % 500 mL Inject 600 mg into the vein every 6 (six) months.     ondansetron  (ZOFRAN ) 8 MG tablet Take 0.5 tablets (4 mg total) by mouth daily as needed for nausea or vomiting. 30 tablet 5   tadalafil  (CIALIS ) 20 MG tablet Take 1 tablet (20 mg total) by mouth daily as needed for erectile dysfunction. 30 tablet 3   zolpidem (AMBIEN) 10 MG tablet Take 10 mg by mouth at bedtime as needed for sleep.     rosuvastatin (CRESTOR) 5 MG tablet Take 5 mg by mouth daily. (Patient not taking: Reported on 01/21/2024)     No facility-administered medications prior to visit.    PAST MEDICAL HISTORY: Past Medical History:  Diagnosis Date   Anemia    G6PD   Anxiety  Back pain, chronic    low   Blood transfusion without reported diagnosis    Depression    DVT (deep venous thrombosis) (HCC)    Dyslipidemia    Gait disturbance    Gynecomastia, male    Hx of dizziness    Hypothyroidism    MS (multiple sclerosis) (HCC)    probable   PONV (postoperative nausea and vomiting)    Nausea   Snoring    mild sleep apnea. Pt was not required to wear CPAP machine, instead he uses nasal spray   Testosterone  deficiency    Vision abnormalities     PAST SURGICAL HISTORY: Past Surgical History:  Procedure Laterality Date   BUNIONECTOMY Right    GYNECOMASTIA EXCISION     X 2   GYNECOMASTIA MASTECTOMY Bilateral 03/09/2014   Procedure: BILATERAL EXCISION GYNECOMASTIA;  Surgeon: Deward GORMAN Curvin DOUGLAS, MD;  Location: [facility] OR;  Service: General;  Laterality: Bilateral;   ULNAR NERVE REPAIR Left     FAMILY HISTORY: Family History  Problem Relation Age of Onset   Breast cancer Mother    Hypertension Father    Prostate cancer Father    Clotting disorder Father    Hyperlipidemia Father    Hypertension Brother    Hypertension Brother    Bladder Cancer Paternal Grandfather    Stroke Paternal Grandfather     SOCIAL HISTORY:  Social History   Socioeconomic  History   Marital status: Single    Spouse name: Not on file   Number of children: 0   Years of education: College    Highest education level: Not on file  Occupational History   Occupation: truck driver  Tobacco Use   Smoking status: Never   Smokeless tobacco: Never  Vaping Use   Vaping status: Never Used  Substance and Sexual Activity   Alcohol use: Yes    Alcohol/week: 0.0 standard drinks of alcohol    Comment: social   Drug use: No   Sexual activity: Not on file  Other Topics Concern   Not on file  Social History Narrative   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    Patient has a 4 year college education.    Patient has no children.          Social Drivers of Corporate investment banker Strain: Not on file  Food Insecurity: Not on file  Transportation Needs: Not on file  Physical Activity: Not on file  Stress: Not on file  Social Connections: Unknown (11/25/2021)   Received from [facility]   Social Network    Social Network: Not on file  Intimate Partner Violence: Unknown (10/24/2021)   Received from [facility]   HITS    Physically Hurt: Not on file    Insult or Talk Down To: Not on file    Threaten Physical Harm: Not on file    Scream or Curse: Not on file     PHYSICAL EXAM  Vitals:   01/21/24 1350  BP: 119/74  Pulse: 90  SpO2: 98%  Weight: 178 lb 8 oz (81 kg)  Height: 5' 6 (1.676 m)    Body mass index is 28.81 kg/m.   General: The patient is well-developed and well-nourished and in no acute distress   Neurologic Exam  Mental status: The patient is alert and oriented x 3 at the time of the examination. The patient has apparent normal recent and remote memory, with an apparently normal attention span and concentration  ability.   Speech is normal.  Cranial nerves: Extraocular movements are full.  Facial strength and sensation is normal.  Trapezius strength is normal.  No obvious hearing deficits are noted.  Motor:  Muscle bulk and tone are  normal. Strength is  5 / 5 in all 4 extremities.   Sensory: Sensory testing is intact to touch and vibration in the arms and legs.  Coordination: Cerebellar testing reveals mildly reduced right finger-nose-finger and heel-to-shin.  Gait and station: Station is normal. Gait is normal and tandem is near normal the Romberg is negative..  Reflexes: Deep tendon reflexes are 2 at arms and ankles and 3 at knees with some spread.  There is no ankle clonus.  .      _________________________________________________   Relapsing remitting multiple sclerosis (HCC)  High risk medication use  Other fatigue  Insomnia, unspecified type  Urinary frequency   1.   His MS has done well on Ocrevus .  He will continue.  Check IgG/IgM and CD19/20 today.   2.   Check an MRI of the brain  to determine if there has been any subclinical progression.  If this is occurring we would need to consider a different disease modifying therapy.  Also check IgG/IgM 1-2 times a year.    2.   Continue Adderall for MS fatigue and ADD.  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 3.   Stay active and exercise as tolerated.  Take OTC vitamin D . 4.   Return in 6 months or sooner if there are new or worsening neurologic symptoms.  S Romie Keeble A. Vear, MD, PhD 01/21/2024, 2:10 PM Certified in Neurology, Clinical Neurophysiology, Sleep Medicine, Pain Medicine and Neuroimaging  [facility] [address] [place], [place] [address] [phone]

## 2024-01-22 ENCOUNTER — Telehealth: Payer: Self-pay | Admitting: Neurology

## 2024-01-22 LAB — LIPID PANEL
Chol/HDL Ratio: 15 ratio — ABNORMAL HIGH (ref 0.0–5.0)
Cholesterol, Total: 270 mg/dL — ABNORMAL HIGH (ref 100–199)
HDL: 18 mg/dL — ABNORMAL LOW (ref >39)
LDL Chol Calc (NIH): 218 mg/dL — ABNORMAL HIGH (ref 0–99)
Triglycerides: 172 mg/dL — ABNORMAL HIGH (ref 0–149)
VLDL Cholesterol Cal: 34 mg/dL (ref 5–40)

## 2024-01-22 LAB — COMPREHENSIVE METABOLIC PANEL WITH GFR
ALT: 62 IU/L — ABNORMAL HIGH (ref 0–44)
AST: 112 IU/L — ABNORMAL HIGH (ref 0–40)
Albumin: 4.6 g/dL (ref 4.1–5.1)
Alkaline Phosphatase: 113 IU/L (ref 44–121)
BUN/Creatinine Ratio: 13 (ref 9–20)
BUN: 15 mg/dL (ref 6–24)
Bilirubin Total: 1.1 mg/dL (ref 0.0–1.2)
CO2: 20 mmol/L (ref 20–29)
Calcium: 9.8 mg/dL (ref 8.7–10.2)
Chloride: 101 mmol/L (ref 96–106)
Creatinine, Ser: 1.12 mg/dL (ref 0.76–1.27)
Globulin, Total: 2.1 g/dL (ref 1.5–4.5)
Glucose: 94 mg/dL (ref 70–99)
Potassium: 4.7 mmol/L (ref 3.5–5.2)
Sodium: 136 mmol/L (ref 134–144)
Total Protein: 6.7 g/dL (ref 6.0–8.5)
eGFR: 83 mL/min/{1.73_m2} (ref >59)

## 2024-01-22 LAB — IGG, IGA, IGM
IgA/Immunoglobulin A, Serum: 75 mg/dL — ABNORMAL LOW (ref 90–386)
IgG (Immunoglobin G), Serum: 790 mg/dL (ref 603–1613)
IgM (Immunoglobulin M), Srm: 25 mg/dL (ref 20–172)

## 2024-01-22 NOTE — Telephone Encounter (Signed)
 I called him to schedule the MRI at [facility] but he needs a Monday due to work so he is going to call [facility] to get scheduled. [facility] NPR case #[id number]  [phone]

## 2024-01-25 LAB — CD20 B CELLS
% CD19-B Cells: 0.4 % — ABNORMAL LOW (ref 4.6–22.1)
% CD20-B Cells: 0.4 % — ABNORMAL LOW (ref 5.0–22.3)

## 2024-01-26 ENCOUNTER — Ambulatory Visit: Payer: Self-pay | Admitting: Neurology

## 2024-02-04 ENCOUNTER — Ambulatory Visit
Admission: RE | Admit: 2024-02-04 | Discharge: 2024-02-04 | Disposition: A | Discharge status: Home / Self Care | Source: Ambulatory Visit | Attending: Neurology | Admitting: Neurology

## 2024-02-04 DIAGNOSIS — G35 Multiple sclerosis: Secondary | ICD-10-CM

## 2024-02-04 MED ORDER — GADOPICLENOL 0.5 MMOL/ML IV SOLN
8.0000 mL | Freq: Once | INTRAVENOUS | Status: AC | PRN
  Administered 2024-02-04: 8 mL via INTRAVENOUS

## 2024-02-11 ENCOUNTER — Ambulatory Visit: Admitting: Family Medicine

## 2024-03-02 NOTE — Progress Notes (Unsigned)
 Cardiology Office Note:  .   Date:  03/03/2024  ID:  [name], DOB November 20, 1980, MRN [id number] PCP: Henry Ingle, MD  [facility] Providers Cardiologist:  Gordy Bergamo, MD    Chief Complaint  Patient presents with   Chest Pain   New Patient (Initial Visit)    History of Present Illness: .   Jeremy [name] is a 43 y.o. Caucasian male patient with family history of premature coronary disease in his brother who had coronary stents at the age of 13, hypercholesterolemia, MS being followed by Dr. Charlie Crete (Neuro) presents to establish care for evaluation of chest pain.  Discussed the use of AI scribe software for clinical note transcription with the patient, who gave verbal consent to proceed.  History of Present Illness Jeremy [name] is a 43 year old male with high cholesterol who presents with chest pain and fatigue.  He experiences chest pain described as spasms, sometimes visible, not associated with heavy activities.  He has also noticed exertional dyspnea.  He is concerned about coronary artery disease as his brother recently was diagnosed with CAD as well.  He has a long history of hypercholesterolemia that is untreated.    He experiences significant fatigue, feeling tired even after naps, and can fall asleep during the day. He gets tired quickly and experiences shortness of breath during exertion. He exercises regularly, including weightlifting and cardio, but gets out of breath easily. He maintains a healthy diet, primarily consuming chicken, fish, avocados, and yogurt, with minimal fried foods and pork. Family history includes premature coronary disease; his brother had a stent placed due to LAD stenosis, and his grandmother had cholesterol levels in the 300s.  Labs   Lab Results  Component Value Date   CHOL 270 (H) 01/21/2024   HDL 18 (L) 01/21/2024   LDLCALC 218 (H) 01/21/2024   TRIG 172 (H) 01/21/2024   CHOLHDL 15.0 (H) 01/21/2024   No results found for: LIPOA   Lab Results  Component Value Date   NA 136 01/21/2024   K 4.7 01/21/2024   CO2 20 01/21/2024   GLUCOSE 94 01/21/2024   BUN 15 01/21/2024   CREATININE 1.12 01/21/2024   CALCIUM 9.8 01/21/2024   EGFR 83 01/21/2024   GFRNONAA >60 01/16/2020      Latest Ref Rng & Units 01/21/2024    2:23 PM 09/20/2020    9:27 AM 01/16/2020    9:28 AM  BMP  Glucose 70 - 99 mg/dL 94  90  87   BUN 6 - 24 mg/dL 15  16  13    Creatinine 0.76 - 1.27 mg/dL 8.87  8.99  8.93   BUN/Creat Ratio 9 - 20 13  16     Sodium 134 - 144 mmol/L 136  141  143   Potassium 3.5 - 5.2 mmol/L 4.7  5.0  4.5   Chloride 96 - 106 mmol/L 101  104  108   CO2 20 - 29 mmol/L 20  20  25    Calcium 8.7 - 10.2 mg/dL 9.8  9.8  9.6       Latest Ref Rng & Units 06/19/2022    9:18 AM 12/01/2021    8:00 AM 09/20/2020    9:27 AM  CBC  WBC 3.4 - 10.8 x10E3/uL 8.3  7.5  6.6   Hemoglobin 13.0 - 17.7 g/dL 82.6  83.3  83.5   Hematocrit 37.5 - 51.0 % 53.2  50.5  49.4   Platelets 150 - 450 x10E3/uL 215  257  172    No results found for: HGBA1C  No results found for: TSH   External Labs:  Labs 01/21/2024:  Serum glucose 94 mg, BUN 15, creatinine 1.12, EGFR 83 mL, potassium 4.7, LFTs abnormal with open phosphate is normal at 113, AST 112, ALT 62.  Compared to 06/14/2023, LFTs were normal.  Total cholesterol 270, triglycerides 172, HDL 18, LDL 218.  TSH 2020: Normal  ROS  Review of Systems  Constitutional: Positive for malaise/fatigue.  Cardiovascular:  Positive for chest pain and dyspnea on exertion. Negative for leg swelling.   Physical Exam:   VS:  BP 110/68 (BP Location: Left Arm, Patient Position: Sitting, Cuff Size: Large)   Pulse 76   Resp 16   Ht 5' 6 (1.676 m)   Wt 179 lb (81.2 kg)   SpO2 96%   BMI 28.89 kg/m    Wt Readings from Last 3 Encounters:  03/03/24 179 lb (81.2 kg)  01/21/24 178 lb 8 oz (81 kg)  12/12/22 178 lb 8 oz (81 kg)    Physical Exam Constitutional:      Appearance: He is well-developed.   Neck:     Vascular: No carotid bruit or JVD.  Cardiovascular:     Rate and Rhythm: Normal rate and regular rhythm.     Pulses: Intact distal pulses.     Heart sounds: Normal heart sounds. No murmur heard.    No gallop.  Pulmonary:     Effort: Pulmonary effort is normal.     Breath sounds: Normal breath sounds.  Abdominal:     General: Bowel sounds are normal.     Palpations: Abdomen is soft.  Musculoskeletal:     Right lower leg: No edema.     Left lower leg: No edema.    Studies Reviewed: SABRA     EKG:    EKG Interpretation Date/Time:  Monday March 03 2024 08:20:13 EDT Ventricular Rate:  75 PR Interval:  156 QRS Duration:  92 QT Interval:  318 QTC Calculation: 355 R Axis:   41  Text Interpretation: EKG 03/03/2024: Normal sinus rhythm with rate of 75 bpm, normal EKG. Confirmed by Oceanna Arruda, Jagadeesh ([id number]) on 03/03/2024 8:33:29 AM    Medications ordered    No orders of the defined types were placed in this encounter.    ASSESSMENT AND PLAN: .      ICD-10-CM   1. Precordial pain  R07.2     2. Familial hypercholanemia  E78.79 EKG 12-Lead    3. Family history of premature CAD: Brother with CAD at age 71 years  Z61.49     4. Abnormal LFTs  R79.89      Assessment & Plan Precordial pain and exertional shortness of breath, possible angina Intermittent chest pain and exertional dyspnea, possibly angina-related. Symptoms are atypical due to high activity level. Concern for coronary artery disease given family history of high-grade LAD stenosis requiring stent in brother. Differential includes cardiac and non-cardiac causes. - Order coronary CT angiogram. - Prescribe metoprolol  50 mg BID for one day before and on the day of the CT angiogram to slow heart rate. - Order echocardiogram to assess cardiac function.  Familial hypercholesterolemia with abnormal liver function tests Familial hypercholesterolemia with LDL of 218, likely genetic given family history and healthy  lifestyle. Abnormal liver function tests, possibly related to MS medication, contraindicate statin use. Plan to initiate PCSK9 inhibitor therapy to manage cholesterol levels. Discussed Repatha  as a treatment option, highlighting minimal side effects compared  to statins, and no expected impact on sexual function. Explained that Repatha  is a monoclonal antibody similar to insulin for diabetics, with rare side effects such as fatigue and weakness. - Prescribe Repatha  140 mg, administer first dose in office, then self-administer at home. - Recheck cholesterol levels in 4-6 weeks.  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  Abnormal LFTs Probably related to IgG infusion for MS.  OV after the tests.   Signed,  Gordy Bergamo, MD, [facility] 03/03/2024, 8:47 AM [facility] [address] [place], [place] [address] Phone: [phone]. Fax:  [phone]

## 2024-03-03 ENCOUNTER — Ambulatory Visit: Attending: Cardiology | Admitting: Cardiology

## 2024-03-03 ENCOUNTER — Encounter: Payer: Self-pay | Admitting: Cardiology

## 2024-03-03 VITALS — BP 110/68 | HR 76 | Resp 16 | Ht 66.0 in | Wt 179.0 lb

## 2024-03-03 DIAGNOSIS — Z8249 Family history of ischemic heart disease and other diseases of the circulatory system: Secondary | ICD-10-CM

## 2024-03-03 DIAGNOSIS — R7989 Other specified abnormal findings of blood chemistry: Secondary | ICD-10-CM

## 2024-03-03 DIAGNOSIS — E7801 Familial hypercholesterolemia: Secondary | ICD-10-CM | POA: Diagnosis not present

## 2024-03-03 DIAGNOSIS — R072 Precordial pain: Secondary | ICD-10-CM | POA: Diagnosis not present

## 2024-03-03 DIAGNOSIS — R0609 Other forms of dyspnea: Secondary | ICD-10-CM

## 2024-03-03 DIAGNOSIS — E7800 Pure hypercholesterolemia, unspecified: Secondary | ICD-10-CM

## 2024-03-03 MED ORDER — METOPROLOL TARTRATE 50 MG PO TABS: ORAL_TABLET | ORAL | 0 refills | Status: AC

## 2024-03-03 MED ORDER — REPATHA SURECLICK 140 MG/ML ~~LOC~~ SOAJ: 140.0000 mg | SUBCUTANEOUS | 1 refills | Status: DC

## 2024-03-03 NOTE — Patient Instructions (Addendum)
 Medication Instructions:  Your physician has recommended you make the following change in your medication: Start Repatha  140 mg--inject into skin every 14 days  *If you need a refill on your cardiac medications before your next appointment, please call your pharmacy*  Lab Work: none If you have labs (blood work) drawn today and your tests are completely normal, you will receive your results only by: MyChart Message (if you have MyChart) OR A paper copy in the mail If you have any lab test that is abnormal or we need to change your treatment, we will call you to review the results.  Testing/Procedures: Your physician has requested that you have cardiac CT. Cardiac computed tomography (CT) is a painless test that uses an x-ray machine to take clear, detailed pictures of your heart. For further information please visit [email or web address] Please follow instruction sheet as given.    Follow-Up: At [facility], you and your health needs are our priority.  As part of our continuing mission to provide you with exceptional heart care, our providers are all part of one team.  This team includes your primary Cardiologist (physician) and Advanced Practice Providers or APPs (Physician Assistants and Nurse Practitioners) who all work together to provide you with the care you need, when you need it.  Your next appointment:   October 6   Provider:   Gordy Bergamo, MD    We recommend signing up for the patient portal called MyChart.  Sign up information is provided on this After Visit Summary.  MyChart is used to connect with patients for Virtual Visits (Telemedicine).  Patients are able to view lab/test results, encounter notes, upcoming appointments, etc.  Non-urgent messages can be sent to your provider as well.   To learn more about what you can do with MyChart, go to [email or web address].   Other Instructions   Your cardiac CT will be scheduled at one of the below locations:   [facility] [address] [place], [place] [address] [phone]  OR   [facility] [address] [place], [place] [address] [phone]  OR   [facility] [address] [place], [place] [address] [phone]  OR   [facility] [address]  [place], [place] [address]  OR   [facility] [address] ,   If scheduled at [facility], please arrive at the [facility] Entrance (Entrance C2) of [facility] 30 minutes prior to test start time. You can use the FREE valet parking offered at entrance C (encouraged to control the heart rate for the test)  Proceed to the [facility] Radiology Department (first floor) to check-in and test prep.  All radiology patients and guests should use entrance C2 at [facility], accessed from [facility], even though the hospital's physical address listed is [address]  If scheduled at the Heart and Vascular Tower at [address], please enter the parking lot using the [address] entrance and use the FREE valet service at the patient drop-off area. Enter the building and check-in with registration on the main floor.  If scheduled at [facility], please arrive to the Heart and Vascular Center 15 mins early for check-in and test prep.  There is spacious parking and easy access to the radiology department from the [facility] and Vascular entrance. Please enter here and check-in with the desk attendant.  If scheduled at [facility], please arrive 30 minutes early for check-in and test prep.  Please follow these instructions carefully (unless otherwise directed):  An IV will be required for this test and Nitroglycerin  will be given.  Hold all erectile dysfunction medications at least 3 days (72 hrs) prior to test. (Ie viagra , cialis , sildenafil ,  tadalafil , etc)   On the Night Before the Test: Be sure to Drink plenty of water. Do not consume any caffeinated/decaffeinated beverages or chocolate 12 hours prior to your test. Do not take any antihistamines 12 hours prior to your test.   On the Day of the Test: Drink plenty of water until 1 hour prior to the test. Do not eat any food 1 hour prior to test. You may take your regular medications prior to the test.  Take metoprolol  tartrate 50 mg twice daily day before the test and 50 mg day of test two hours prior to test If you take Furosemide/Hydrochlorothiazide/Spironolactone/Chlorthalidone, please HOLD on the morning of the test. Patients who wear a continuous glucose monitor MUST remove the device prior to scanning.           After the Test: Drink plenty of water. After receiving IV contrast, you may experience a mild flushed feeling. This is normal. On occasion, you may experience a mild rash up to 24 hours after the test. This is not dangerous. If this occurs, you can take Benadryl  25 mg, Zyrtec, Claritin, or Allegra and increase your fluid intake. (Patients taking Tikosyn should avoid Benadryl , and may take Zyrtec, Claritin, or Allegra) If you experience trouble breathing, this can be serious. If it is severe call 911 IMMEDIATELY. If it is mild, please call our office.  We will call to schedule your test 2-4 weeks out understanding that some insurance companies will need an authorization prior to the service being performed.   For more information and frequently asked questions, please visit our website : [email or web address]  For non-scheduling related questions, please contact the cardiac imaging nurse navigator should you have any questions/concerns: Cardiac Imaging Nurse Navigators Direct Office Dial: [phone]   For scheduling needs, including cancellations and rescheduling, please call [place], [phone].

## 2024-03-05 ENCOUNTER — Encounter (HOSPITAL_COMMUNITY): Payer: Self-pay

## 2024-03-10 ENCOUNTER — Ambulatory Visit (HOSPITAL_COMMUNITY)
Admission: RE | Admit: 2024-03-10 | Discharge: 2024-03-10 | Disposition: A | Discharge status: Home / Self Care | Source: Ambulatory Visit | Attending: Cardiovascular Disease | Admitting: Cardiovascular Disease

## 2024-03-10 DIAGNOSIS — R072 Precordial pain: Secondary | ICD-10-CM | POA: Diagnosis not present

## 2024-03-10 DIAGNOSIS — I2510 Atherosclerotic heart disease of native coronary artery without angina pectoris: Secondary | ICD-10-CM | POA: Insufficient documentation

## 2024-03-10 MED ORDER — NITROGLYCERIN 0.4 MG SL SUBL: 0.8000 mg | SUBLINGUAL_TABLET | Freq: Once | SUBLINGUAL | Status: DC

## 2024-03-10 MED ORDER — IOHEXOL 350 MG/ML SOLN
100.0000 mL | Freq: Once | INTRAVENOUS | Status: AC | PRN
  Administered 2024-03-10: 100 mL via INTRAVENOUS

## 2024-03-11 ENCOUNTER — Ambulatory Visit: Payer: Self-pay | Admitting: Cardiology

## 2024-03-11 NOTE — Progress Notes (Signed)
 Coronary CTA 03/11/2024: Coronary calcium score of 37.9 is in the 90th percentile in the [facility] database for males aged 45 for his race and age.  Patient is 43 years of age hence >90th percentile. Minimal nonobstructive CAD with a single focal mixed plaque in the proximal LAD of 1 to 24%. Total plaque volume 48 mm, in the 65th percentile. Normal coronary origins and right dominance.

## 2024-04-22 ENCOUNTER — Telehealth: Payer: Self-pay | Admitting: Neurology

## 2024-04-22 MED ORDER — TADALAFIL 20 MG PO TABS: 20.0000 mg | ORAL_TABLET | Freq: Every day | ORAL | 2 refills | Status: DC | PRN

## 2024-04-22 NOTE — Telephone Encounter (Signed)
 Pt called to request medication refill  tadalafil  (CIALIS ) 20 MG tablet  Pt would like medication sent  [id number] - [place], [place] - [address] AT [facility] OF [address] (Ph: [phone]

## 2024-04-22 NOTE — Telephone Encounter (Signed)
 Last seen on 01/21/24 Follow up scheduled on 08/11/24 Rx sent

## 2024-04-28 ENCOUNTER — Ambulatory Visit: Attending: Cardiology | Admitting: Cardiology

## 2024-04-28 ENCOUNTER — Encounter: Payer: Self-pay | Admitting: Cardiology

## 2024-04-28 VITALS — BP 104/62 | HR 63 | Ht 66.0 in | Wt 175.0 lb

## 2024-04-28 DIAGNOSIS — E78011 Heterozygous familial hypercholesterolemia (hefh): Secondary | ICD-10-CM

## 2024-04-28 DIAGNOSIS — Z8249 Family history of ischemic heart disease and other diseases of the circulatory system: Secondary | ICD-10-CM

## 2024-04-28 NOTE — Patient Instructions (Addendum)
 Thank you for choosing [facility]!     Medication Instructions:  No medication changes were made during today's visit.  *If you need a refill on your cardiac medications before your next appointment, please call your pharmacy*   Lab Work: LIPID PANEL in 6 weeks If you have labs (blood work) drawn today and your tests are completely normal, you will receive your results only by: MyChart Message (if you have MyChart) OR A paper copy in the mail If you have any lab test that is abnormal or we need to change your treatment, we will call you to review the results.   Testing/Procedures: No procedures were ordered during today's visit.   Your next appointment:  A letter will be mailed to you as a reminder to call the office for your next follow up appointment.  6 month(s)   Provider:   Gordy Bergamo, MD     Follow-Up: At [facility], you and your health needs are our priority.  As part of our continuing mission to provide you with exceptional heart care, we have created designated Provider Care Teams.  These Care Teams include your primary Cardiologist (physician) and Advanced Practice Providers (APPs -  Physician Assistants and Nurse Practitioners) who all work together to provide you with the care you need, when you need it. We recommend signing up for the patient portal called MyChart.  Sign up information is provided on this After Visit Summary.  MyChart is used to connect with patients for Virtual Visits (Telemedicine).  Patients are able to view lab/test results, encounter notes, upcoming appointments, etc.  Non-urgent messages can be sent to your provider as well.   To learn more about what you can do with MyChart, go to [email or web address].

## 2024-04-28 NOTE — Progress Notes (Signed)
 Cardiology Office Note:  .   Date:  04/28/2024  ID:  Jeremy [name], DOB 1981-01-09, MRN [id number] PCP: Henry Ingle, MD  [facility] Providers Cardiologist:  Gordy Bergamo, MD   History of Present Illness: .   Jeremy [name] is a 43 y.o. Caucasian male patient with family history of premature coronary disease in his brother who had coronary stents at the age of 105, hypercholesterolemia, MS being followed by Dr. Charlie Crete (Neuro).  I had seen him for chest pain about 2 to 3 months ago, and after discussions had recommended CCTA which he underwent on 03/10/2024 revealing presumed coronary calcium score in the 90th percentile as his <87 years of age and a 65th percentile in plaque volume.  I had also recommended in view of premature coronary disease family history and family hypercholesterolemia with LDL >200 at baseline, started him on Repatha  as he could not tolerate statins in the past.  He has not started this and wanted to discuss further with me.  He has not had any further chest pain and has been physically active.  Cardiac Studies relevent.    CT CORONARY MORPH W/CTA COR W/SCORE 03/10/2024  Coronary calcium score of 37.9 is in the 90th percentile in the [place] for males aged 58 for his race and age.  Patient is 43 years of age hence >90th percentile. Minimal nonobstructive CAD with a single focal mixed plaque in the proximal LAD of 1 to 24%. Total plaque volume 48 mm, in the 65th percentile. Normal coronary origins and right dominance.    Discussed the use of AI scribe software for clinical note transcription with the patient, who gave verbal consent to proceed.  History of Present Illness Jeremy [name] is a 43 year old male with familial hypercholesterolemia who presents for evaluation of coronary artery disease.  Hypercholesterolemia - Familial hypercholesterolemia with persistently elevated LDL cholesterol, most recent value 218 mg/dL - Not currently on Repatha  due to  concerns about side effects - Attempting dietary modifications for cholesterol management  Coronary artery disease risk and imaging findings - Recent coronary CT angiogram demonstrates minimal coronary artery blockages - Coronary calcium score in the 90th percentile for age - Soft plaque burden in the 65th percentile for age  Cardiac symptoms - No current chest pain, shortness of breath, or fatigue - History of occasional chest pain in the past  Family history of premature coronary disease - Family history of high cholesterol and premature coronary artery disease in brother and father   Labs   Lab Results  Component Value Date   CHOL 270 (H) 01/21/2024   HDL 18 (L) 01/21/2024   LDLCALC 218 (H) 01/21/2024   TRIG 172 (H) 01/21/2024   CHOLHDL 15.0 (H) 01/21/2024   No results found for: LIPOA  Recent Labs    01/21/24 1423  NA 136  K 4.7  CL 101  CO2 20  GLUCOSE 94  BUN 15  CREATININE 1.12  CALCIUM 9.8    Lab Results  Component Value Date   ALT 62 (H) 01/21/2024   AST 112 (H) 01/21/2024   ALKPHOS 113 01/21/2024   BILITOT 1.1 01/21/2024      Latest Ref Rng & Units 06/19/2022    9:18 AM 12/01/2021    8:00 AM 09/20/2020    9:27 AM  CBC  WBC 3.4 - 10.8 x10E3/uL 8.3  7.5  6.6   Hemoglobin 13.0 - 17.7 g/dL 82.6  83.3  83.5   Hematocrit 37.5 - 51.0 %  53.2  50.5  49.4   Platelets 150 - 450 x10E3/uL 215  257  172     Care everywhere/Faxed External Labs:  Labs 01/21/2024:   Total cholesterol 270, triglycerides 172, HDL 18, LDL 218.   TSH 2020: Normal  ROS  Review of Systems  Cardiovascular:  Negative for chest pain, dyspnea on exertion and leg swelling.   Physical Exam:   VS:  BP 104/62   Pulse 63   Ht 5' 6 (1.676 m)   Wt 175 lb (79.4 kg)   SpO2 98%   BMI 28.25 kg/m    Wt Readings from Last 3 Encounters:  04/28/24 175 lb (79.4 kg)  03/03/24 179 lb (81.2 kg)  01/21/24 178 lb 8 oz (81 kg)    BP Readings from Last 3 Encounters:  04/28/24 104/62   03/10/24 110/63  03/03/24 110/68   Physical Exam Neck:     Vascular: No carotid bruit or JVD.  Cardiovascular:     Rate and Rhythm: Normal rate and regular rhythm.     Pulses: Intact distal pulses.     Heart sounds: Normal heart sounds. No murmur heard.    No gallop.  Pulmonary:     Effort: Pulmonary effort is normal.     Breath sounds: Normal breath sounds.  Abdominal:     General: Bowel sounds are normal.     Palpations: Abdomen is soft.  Musculoskeletal:     Right lower leg: No edema.     Left lower leg: No edema.    EKG:         ASSESSMENT AND PLAN: .      ICD-10-CM   1. Heterozygous familial hypercholesterolemia  E78.011 Lipid panel    2. Family history of premature CAD: Brother with CAD at age 24 years  Z72.49 Lipid panel      Assessment and Plan Assessment & Plan Familial hypercholesterolemia with subclinical coronary atherosclerosis and elevated coronary calcium (primary prevention) LDL level is 218 mg/dL, unchanged over recent tests. Coronary CT angiogram reveals minimal blockages but significant plaque buildup, with coronary calcium in the 90th percentile and soft plaque in the 65th percentile for his age. This indicates an advanced heart age and increased risk for coronary artery disease and potential myocardial infarction. Primary prevention is crucial to reduce the risk of future cardiac events. - Initiate Repatha  (evolocumab ) every two weeks to reduce LDL levels and plaque buildup, as diet alone is insufficient. - Discussed concerns about medication side effects; agreed to try Repatha  after discussing benefits and rarity of adverse effects. - Repatha  is expected to significantly reduce soft plaque over six months, though calcium will remain unchanged. - Check lipid profile in six weeks. - Schedule follow-up appointment in six months. - Release to family doctor for ongoing management.   Follow up: 6 months and if stable as needed.  I did extensive  counseling regarding medication management, extensive discussion regarding coronary CT angiogram findings and risks of future cardiovascular events.  Signed,  Gordy Bergamo, MD, [facility] 04/28/2024, 4:29 PM [facility] [address] [place], [place] [address] Phone: [phone]. Fax:  [phone]

## 2024-06-02 LAB — LIPID PANEL
Chol/HDL Ratio: 10.7 ratio — ABNORMAL HIGH (ref 0.0–5.0)
Cholesterol, Total: 246 mg/dL — ABNORMAL HIGH (ref 100–199)
HDL: 23 mg/dL — ABNORMAL LOW (ref >39)
LDL Chol Calc (NIH): 203 mg/dL — ABNORMAL HIGH (ref 0–99)
Triglycerides: 110 mg/dL (ref 0–149)
VLDL Cholesterol Cal: 20 mg/dL (ref 5–40)

## 2024-06-05 ENCOUNTER — Ambulatory Visit: Payer: Self-pay | Admitting: Cardiology

## 2024-06-05 DIAGNOSIS — E78011 Heterozygous familial hypercholesterolemia (hefh): Secondary | ICD-10-CM

## 2024-06-09 NOTE — Telephone Encounter (Signed)
 ICD-10-CM   1. Heterozygous familial hypercholesterolemia  E78.011 Lipid Panel With LDL/HDL Ratio    Lipoprotein A (LPA)     Orders Placed This Encounter  Procedures   Lipid Panel With LDL/HDL Ratio   Lipoprotein A (LPA)

## 2024-07-08 LAB — LIPID PANEL WITH LDL/HDL RATIO
Cholesterol, Total: 131 mg/dL (ref 100–199)
HDL: 39 mg/dL — ABNORMAL LOW (ref >39)
LDL Chol Calc (NIH): 77 mg/dL (ref 0–99)
LDL/HDL Ratio: 2 ratio (ref 0.0–3.6)
Triglycerides: 73 mg/dL (ref 0–149)
VLDL Cholesterol Cal: 15 mg/dL (ref 5–40)

## 2024-07-08 LAB — LIPOPROTEIN A (LPA): Lipoprotein (a): <8.4 nmol/L (ref <75.0)

## 2024-07-08 NOTE — Progress Notes (Signed)
 Lipids much improved and would have preferred to have LDL < 70, so weight loss and good diet and exercise will help. Your HDL has also increased

## 2024-07-11 NOTE — Progress Notes (Signed)
 Lipids much improved and would have preferred to have LDL < 70, so weight loss and good diet and exercise will help. Your HDL has also increased

## 2024-07-11 NOTE — Progress Notes (Signed)
 Called and spoke with patient regarding his recent LAB results. Patient verbalized understanding.

## 2024-07-31 NOTE — Progress Notes (Unsigned)
 "   PATIENT: Jeremy [name] DOB: Jun 06, 1981  REASON FOR VISIT: follow up HISTORY FROM: patient  No chief complaint on file.    HISTORY OF PRESENT ILLNESS:  07/31/2024 ALL:  He returns for follow up for RRMS. He continues Ocrevus  infusions, last infusion 01/02/2022, next scheduled for 07/03/2022. Labs stable 11/2021. MRI stable 03/2021.   He is feeling well, today. No new or exacerbating symptoms. Gait is stable. No significant pain. He is exercising regularly. No vision changes. No difficulty with bowel or bladder functioning.   Adderall 20mg  TID helps with inattention/MS fatigue (prescribed by PCP). He takes this consistently. Insomnia is stable. He takes Ambien (prescribed by PCP) most nights. He is a full time truck driver. He has a fairly consistent schedule. Mood is good. Sildenafil  helps with ED. He does take vitamin D  2,000iu daily.   He has not had any recurrence of DVT.    HISTORY: (copied from Dr Duncan previous note)  Jeremy [name] is a 44 y.o. man with relapsing remitting multiple sclerosis.     Update 01/21/2024: He continues to do well and feels his MS is stable.    He is on Ocrevus  (since 2017) and tolerates it well.  He is scheduled for his next infusion in 2 weeks (mid July 2025) and last one in [place] 2024.   He has no exacerbation or new neurologic symptom.  No exacerbation.  Last MRI of the brain was performed 03/30/2021 and and showed no new lesions compared to the previous one in 2019.  MRI of the cervical spine in the past had shown foci at C4-C5 and C6.   IgG and IgM and IgA were fine 12/23/20   He does 30+ minutes exercise daily.  He is walking well most of the time but fatigue with longer distance.  He has occasioanl spasms in his arms.   No major weakness.    He occasionally has tingling in an arm for a few minutes in the morning.      Vision is doing well.    He has some nocturia and urinary frequency.   He uses sildenafil  as needed for ED.   He has some fatigue not  interfering with activities.   He notes reduced focus and attention.   He feels Adderall helps and and also helps his MS fatigue.  He sleeps well most nights.  He takes Ambien with benefot but tstill has some nights that he wakes up.    He feels mood is doing well.   He works as a naval architect (engineer, water)   He is usually home every day.   He had T chol > 300 and was first placed on Crestor and then switched to Lipitor as he had myalgias.     MS History:   He was diagnosed in January 2012.   In 2009, he had a several week episode of right hand clumsiness associated with some electric shock sensations. He did not see a doctor about the symptoms. In December 2011, he had the onset of altered taste. A week later, he had nausea and dizziness and over the next couple days these symptoms worsened and he developed right-sided clumsiness and slurred speech. First toward his PCP who felt he might have an ear infection and he was referred to ENT. ENT evaluated him and referred him to Dr. Jenel at [facility] who noted an ataxic gait and ordered an MRI of the spine and brain. Those studies were consistent with multiple  sclerosis and he underwent a lumbar puncture in April 2011 that was also consistent with MS. He was started on Rebif  in June 2011 and remain on the medicine until September 2014 when a repeat MRI showed the interval development of several more lesions. He was switched to Tecfidera  but insurance would no longer cover it so we switched to Gilenya  early 2016.  He had elevated liver enzyme tests on Gilenya  and was switched to every other day but transaminase elevation persisted.  Therefore, he switched to Ocrevus  in late 2017.SABRA      MRIs from 05/25/2014 show a moderate size focus in the left temporal lobe and another 10 fairly small periventricular and deep white matter foci. MRI of the cervical spine showed 2 foci, one at C4 and one at C6. There were no enhancing lesions on those MRIs.   MRI of  the brain 06/16/2015 showed no new lesions.     MRI of the brain 03/15/2016 showed no new lesions.  MRI of the cervical spine 02/08/2017 showed 2 small foci laterally to the right at C4-C5 and laterally to the left at C6.  These were present on the previous MRI.   MRI of the brain 06/19/2018 showed no new lesions.   MRi of the brain 03/30/2021 showed Few small T2/FLAIR hyperintense foci in the periventricular, juxtacortical and deep white matter. No new lesions compared to the 06/19/2018 MRI. Though nonspecific, these are consistent with chronic demyelinating plaque associated with multiple sclerosis.   MRI of the cervical spine 03/30/2021 showed   Two small T2 hyperintense foci within the spinal cord, adjacent to C4-C5 to the right and adjacent to C6 anteriorly to the right.  Both of these were also present on the 2018 MRI and there were no new lesions.  They are consistent with chronic demyelinating plaque associated with multiple sclerosis.    Mild disc degenerative changes at C5-C6 that do not lead to spinal stenosis or compression.  REVIEW OF SYSTEMS: Out of a complete 14 system review of symptoms, the patient complains only of the following symptoms, erectile dysfunction, insomnia, inattention, fatigue and all other reviewed systems are negative.   ALLERGIES: Allergies  Allergen Reactions   Sulfa Antibiotics Nausea Only    HOME MEDICATIONS: Outpatient Medications Prior to Visit  Medication Sig Dispense Refill   amphetamine -dextroamphetamine (ADDERALL) 20 MG tablet Take 1 tablet by mouth 3 (three) times daily.     Evolocumab  (REPATHA  SURECLICK) 140 MG/ML SOAJ Inject 140 mg into the skin every 14 (fourteen) days. 6 mL 1   metoprolol  tartrate (LOPRESSOR ) 50 MG tablet Take one tablet by mouth twice daily day before CT Scan and one tablet 2 hours prior to CT scan 3 tablet 0   Multiple Vitamin (MULTIVITAMIN WITH MINERALS) TABS tablet Take 1 tablet by mouth daily.     ocrelizumab  600 mg in sodium  chloride 0.9 % 500 mL Inject 600 mg into the vein every 6 (six) months.     ondansetron  (ZOFRAN ) 8 MG tablet Take 0.5 tablets (4 mg total) by mouth daily as needed for nausea or vomiting. 30 tablet 5   tadalafil  (CIALIS ) 20 MG tablet Take 1 tablet (20 mg total) by mouth daily as needed for erectile dysfunction. 30 tablet 2   zolpidem (AMBIEN) 10 MG tablet Take 10 mg by mouth at bedtime as needed for sleep.     No facility-administered medications prior to visit.    PAST MEDICAL HISTORY: Past Medical History:  Diagnosis Date   Anemia  G6PD   Anxiety    Back pain, chronic    low   Blood transfusion without reported diagnosis    Depression    DVT (deep venous thrombosis) (HCC)    Dyslipidemia    Gait disturbance    Gynecomastia, male    Hx of dizziness    Hypothyroidism    MS (multiple sclerosis)    probable   PONV (postoperative nausea and vomiting)    Nausea   Snoring    mild sleep apnea. Pt was not required to wear CPAP machine, instead he uses nasal spray   Testosterone  deficiency    Vision abnormalities     PAST SURGICAL HISTORY: Past Surgical History:  Procedure Laterality Date   BUNIONECTOMY Right    GYNECOMASTIA EXCISION     X 2   GYNECOMASTIA MASTECTOMY Bilateral 03/09/2014   Procedure: BILATERAL EXCISION GYNECOMASTIA;  Surgeon: Deward GORMAN Curvin DOUGLAS, MD;  Location: [facility] OR;  Service: General;  Laterality: Bilateral;   ULNAR NERVE REPAIR Left     FAMILY HISTORY: Family History  Problem Relation Age of Onset   Breast cancer Mother    Hypertension Father    Prostate cancer Father    Clotting disorder Father    Hyperlipidemia Father    Hypertension Brother    Hypertension Brother    Bladder Cancer Paternal Grandfather    Stroke Paternal Grandfather     SOCIAL HISTORY: Social History   Socioeconomic History   Marital status: Single    Spouse name: Not on file   Number of children: 0   Years of education: College    Highest education level: Not on file   Occupational History   Occupation: truck driver  Tobacco Use   Smoking status: Never   Smokeless tobacco: Never  Vaping Use   Vaping status: Never Used  Substance and Sexual Activity   Alcohol use: Yes    Alcohol/week: 0.0 standard drinks of alcohol    Comment: social   Drug use: No   Sexual activity: Not on file  Other Topics Concern   Not on file  Social History Narrative   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    Patient has a 4 year college education.    Patient has no children.          Social Drivers of Health   Tobacco Use: Low Risk (04/28/2024)   Patient History    Smoking Tobacco Use: Never    Smokeless Tobacco Use: Never    Passive Exposure: Not on file  Financial Resource Strain: Not on file  Food Insecurity: Not on file  Transportation Needs: Not on file  Physical Activity: Not on file  Stress: Not on file  Social Connections: Unknown (11/25/2021)   Received [facility]   Social Network    Social Network: Not on file  Intimate Partner Violence: Unknown (10/24/2021)   Received from [facility]   HITS    Physically Hurt: Not on file    Insult or Talk Down To: Not on file    Threaten Physical Harm: Not on file    Scream or Curse: Not on file  Depression (EYV7-0): Not on file  Alcohol Screen: Not on file  Housing: Not on file  Utilities: Not on file  Health Literacy: Not on file      PHYSICAL EXAM  There were no vitals filed for this visit.    There is no height or weight on file to calculate BMI.  Generalized: Well developed, in no acute distress  Cardiology: normal rate and rhythm, no murmur noted Respiratory: clear to auscultation bilaterally  Neurological examination  Mentation: Alert oriented to time, place, history taking. Follows all commands speech and language fluent Cranial nerve II-XII: Pupils were equal round reactive to light. Extraocular movements were full, visual field were full on confrontational test. Facial sensation  and strength were normal. Uvula tongue midline. Head turning and shoulder shrug  were normal and symmetric. Motor: The motor testing reveals 5 over 5 strength of all 4 extremities. Good symmetric motor tone is noted throughout.  Sensory: Sensory testing is intact to soft touch on all 4 extremities. No evidence of extinction is noted.  Coordination: Cerebellar testing reveals good finger-nose-finger and heel-to-shin bilaterally.  Gait and station: Gait is normal.  Reflexes: Deep tendon reflexes are symmetric and normal bilaterally.   DIAGNOSTIC DATA (LABS, IMAGING, TESTING) - I reviewed patient records, labs, notes, testing and imaging myself where available.      No data to display           Lab Results  Component Value Date   WBC 8.3 06/19/2022   HGB 17.3 06/19/2022   HCT 53.2 (H) 06/19/2022   MCV 89 06/19/2022   PLT 215 06/19/2022      Component Value Date/Time   NA 136 01/21/2024 1423   K 4.7 01/21/2024 1423   CL 101 01/21/2024 1423   CO2 20 01/21/2024 1423   GLUCOSE 94 01/21/2024 1423   GLUCOSE 87 01/16/2020 0928   BUN 15 01/21/2024 1423   CREATININE 1.12 01/21/2024 1423   CREATININE 1.06 01/16/2020 0928   CALCIUM 9.8 01/21/2024 1423   PROT 6.7 01/21/2024 1423   ALBUMIN 4.6 01/21/2024 1423   AST 112 (H) 01/21/2024 1423   AST 25 01/16/2020 0928   ALT 62 (H) 01/21/2024 1423   ALT 49 (H) 01/16/2020 0928   ALKPHOS 113 01/21/2024 1423   BILITOT 1.1 01/21/2024 1423   BILITOT 0.5 01/16/2020 0928   GFRNONAA >60 01/16/2020 0928   GFRAA >60 01/16/2020 0928   Lab Results  Component Value Date   CHOL 131 07/07/2024   HDL 39 (L) 07/07/2024   LDLCALC 77 07/07/2024   TRIG 73 07/07/2024   CHOLHDL 10.7 (H) 06/02/2024   No results found for: HGBA1C No results found for: VITAMINB12 No results found for: TSH   ASSESSMENT AND PLAN 44 y.o. year old male  has a past medical history of Anemia, Anxiety, Back pain, chronic, Blood transfusion without reported diagnosis,  Depression, DVT (deep venous thrombosis) (HCC), Dyslipidemia, Gait disturbance, Gynecomastia, male, dizziness, Hypothyroidism, MS (multiple sclerosis), PONV (postoperative nausea and vomiting), Snoring, Testosterone  deficiency, and Vision abnormalities. here with   No diagnosis found.    Daune is doing well. He continues to tolerate Ocrevus . We will continue current treatment plan. We will update labs, today. MRI stable 03/2021. He was encouraged to continue healthy lifestyle habits. Close follow up with PCP advised. He will follow up in 6 months, sooner if needed.    No orders of the defined types were placed in this encounter.    No orders of the defined types were placed in this encounter.     Greig Forbes, FNP-C 07/31/2024, 9:07 PM [facility] [address], [address] [place], [place] [address] [phone] "

## 2024-07-31 NOTE — Patient Instructions (Signed)

## 2024-08-04 ENCOUNTER — Ambulatory Visit: Admitting: Family Medicine

## 2024-08-04 ENCOUNTER — Encounter: Payer: Self-pay | Admitting: Family Medicine

## 2024-08-04 VITALS — BP 138/72 | HR 81 | Ht 66.0 in | Wt 179.0 lb

## 2024-08-04 DIAGNOSIS — F988 Other specified behavioral and emotional disorders with onset usually occurring in childhood and adolescence: Secondary | ICD-10-CM

## 2024-08-04 DIAGNOSIS — Z79899 Other long term (current) drug therapy: Secondary | ICD-10-CM | POA: Diagnosis not present

## 2024-08-04 DIAGNOSIS — G4700 Insomnia, unspecified: Secondary | ICD-10-CM | POA: Diagnosis not present

## 2024-08-04 DIAGNOSIS — R5383 Other fatigue: Secondary | ICD-10-CM

## 2024-08-04 DIAGNOSIS — G35A Relapsing-remitting multiple sclerosis: Secondary | ICD-10-CM

## 2024-08-04 DIAGNOSIS — R350 Frequency of micturition: Secondary | ICD-10-CM

## 2024-08-04 MED ORDER — TADALAFIL 20 MG PO TABS: 20.0000 mg | ORAL_TABLET | Freq: Every day | ORAL | 2 refills | Status: AC | PRN

## 2024-08-05 ENCOUNTER — Ambulatory Visit: Payer: Self-pay | Admitting: Family Medicine

## 2024-08-05 LAB — CBC WITH DIFFERENTIAL/PLATELET
Basophils Absolute: 0.1 x10E3/uL (ref 0.0–0.2)
Basos: 1 %
EOS (ABSOLUTE): 0.1 x10E3/uL (ref 0.0–0.4)
Eos: 1 %
Hematocrit: 51.4 % — ABNORMAL HIGH (ref 37.5–51.0)
Hemoglobin: 16 g/dL (ref 13.0–17.7)
Immature Grans (Abs): 0 x10E3/uL (ref 0.0–0.1)
Immature Granulocytes: 0 %
Lymphocytes Absolute: 0.8 x10E3/uL (ref 0.7–3.1)
Lymphs: 10 %
MCH: 27.5 pg (ref 26.6–33.0)
MCHC: 31.1 g/dL — ABNORMAL LOW (ref 31.5–35.7)
MCV: 88 fL (ref 79–97)
Monocytes Absolute: 0.6 x10E3/uL (ref 0.1–0.9)
Monocytes: 7 %
Neutrophils Absolute: 6.8 x10E3/uL (ref 1.4–7.0)
Neutrophils: 81 %
Platelets: 234 x10E3/uL (ref 150–450)
RBC: 5.82 x10E6/uL — ABNORMAL HIGH (ref 4.14–5.80)
RDW: 13.8 % (ref 11.6–15.4)
WBC: 8.4 x10E3/uL (ref 3.4–10.8)

## 2024-08-05 LAB — IGG, IGA, IGM
IgG (Immunoglobin G), Serum: 740 mg/dL (ref 603–1613)
IgM (Immunoglobulin M), Srm: 24 mg/dL (ref 20–172)
Immunoglobulin A, (IgA) QN, Serum: 79 mg/dL — ABNORMAL LOW (ref 90–386)

## 2024-08-11 ENCOUNTER — Ambulatory Visit: Admitting: Family Medicine

## 2024-08-29 ENCOUNTER — Emergency Department (HOSPITAL_BASED_OUTPATIENT_CLINIC_OR_DEPARTMENT_OTHER)

## 2024-08-29 ENCOUNTER — Other Ambulatory Visit: Payer: Self-pay

## 2024-08-29 ENCOUNTER — Encounter (HOSPITAL_BASED_OUTPATIENT_CLINIC_OR_DEPARTMENT_OTHER): Payer: Self-pay

## 2024-08-29 ENCOUNTER — Emergency Department (HOSPITAL_BASED_OUTPATIENT_CLINIC_OR_DEPARTMENT_OTHER)
Admission: EM | Admit: 2024-08-29 | Discharge: 2024-08-29 | Disposition: A | Discharge status: Home / Self Care | Source: Home / Self Care | Attending: Emergency Medicine | Admitting: Emergency Medicine

## 2024-08-29 ENCOUNTER — Other Ambulatory Visit (HOSPITAL_BASED_OUTPATIENT_CLINIC_OR_DEPARTMENT_OTHER): Payer: Self-pay

## 2024-08-29 DIAGNOSIS — I82431 Acute embolism and thrombosis of right popliteal vein: Secondary | ICD-10-CM

## 2024-08-29 LAB — COMPREHENSIVE METABOLIC PANEL WITH GFR
ALT: 54 U/L — ABNORMAL HIGH (ref 0–44)
AST: 49 U/L — ABNORMAL HIGH (ref 15–41)
Albumin: 4.4 g/dL (ref 3.5–5.0)
Alkaline Phosphatase: 82 U/L (ref 38–126)
Anion gap: 11 (ref 5–15)
BUN: 17 mg/dL (ref 6–20)
CO2: 25 mmol/L (ref 22–32)
Calcium: 9.5 mg/dL (ref 8.9–10.3)
Chloride: 101 mmol/L (ref 98–111)
Creatinine, Ser: 1.08 mg/dL (ref 0.61–1.24)
GFR, Estimated: >60 mL/min
Glucose, Bld: 91 mg/dL (ref 70–99)
Potassium: 4.3 mmol/L (ref 3.5–5.1)
Sodium: 137 mmol/L (ref 135–145)
Total Bilirubin: 0.9 mg/dL (ref 0.0–1.2)
Total Protein: 6.5 g/dL (ref 6.5–8.1)

## 2024-08-29 LAB — CBC WITH DIFFERENTIAL/PLATELET
Abs Immature Granulocytes: 0.06 10*3/uL (ref 0.00–0.07)
Basophils Absolute: 0.1 10*3/uL (ref 0.0–0.1)
Basophils Relative: 1 %
Eosinophils Absolute: 0.1 10*3/uL (ref 0.0–0.5)
Eosinophils Relative: 1 %
HCT: 47.8 % (ref 39.0–52.0)
Hemoglobin: 15.1 g/dL (ref 13.0–17.0)
Immature Granulocytes: 1 %
Lymphocytes Relative: 14 %
Lymphs Abs: 1.3 10*3/uL (ref 0.7–4.0)
MCH: 27.9 pg (ref 26.0–34.0)
MCHC: 31.6 g/dL (ref 30.0–36.0)
MCV: 88.2 fL (ref 80.0–100.0)
Monocytes Absolute: 0.7 10*3/uL (ref 0.1–1.0)
Monocytes Relative: 8 %
Neutro Abs: 7 10*3/uL (ref 1.7–7.7)
Neutrophils Relative %: 75 %
Platelets: 218 10*3/uL (ref 150–400)
RBC: 5.42 MIL/uL (ref 4.22–5.81)
RDW: 15.1 % (ref 11.5–15.5)
WBC: 9.3 10*3/uL (ref 4.0–10.5)
nRBC: 0 % (ref 0.0–0.2)

## 2024-08-29 MED ORDER — RIVAROXABAN (XARELTO) VTE STARTER PACK (15 & 20 MG)
ORAL_TABLET | ORAL | 0 refills | Status: AC
  Filled 2024-08-29: qty 51, 30d supply, fill #0

## 2024-08-29 MED ORDER — RIVAROXABAN 15 MG PO TABS: 15.0000 mg | ORAL_TABLET | Freq: Once | ORAL | Status: DC

## 2024-08-29 NOTE — Discharge Instructions (Addendum)
 It was a pleasure taking care of you today.  Based on your history, physical exam, as well as your labs and imaging I feel you are safe for discharge.  Today the ultrasound of your right leg did show a blood clot in your posterior knee as well as calf.  Because of this you have been restarted on blood thinning medication.  If you have any falls or bad cuts, please return to the emergency department now that you are on a blood thinning medication.  Please do not drink alcohol while on this medication.  If you experience redness, swelling, or pain extending up to your thigh, chest pain, shortness of breath, or other concerning symptom please return to the emergency department or seek further medical care.  If symptoms worsen recommend follow-up within 48 hours.  Please make your primary care provider aware of your results today.  I have attached the information for the DVT clinic in [place].  Please call and make an appointment soon as possible.  The address for the DVT clinic:  [facility] [address], [address] [place], [place], [place] [address] Phone: [phone]  Located in [facility] with free valet parking and parking deck.  Recommend follow-up within the next week, sooner if symptoms warrant.  Hours: Mon - Fri 8:00 a.m. - 4:30 p.m., closed on holidays  Please make your primary care provider aware of your workup and all findings today. Please avoid sitting for long periods of time and recommend compression stockings.

## 2024-08-29 NOTE — ED Provider Notes (Incomplete)
 " [place] [facility] AT [facility] Provider Note   CSN: [id number] Arrival date & time: 08/29/24  1400     Patient presents with: Leg Pain   Jeremy [name] is a 44 y.o. male who presents emergency department with a chief complaint of right calf pain.  Patient states that he was working out at the gym earlier today doing lunges and felt like he stretched something in the back of his right calf.  However patient has had a previous blood clot to his right leg and  {Add pertinent medical, surgical, social history, OB history to HPI:32947}  Leg Pain      Prior to Admission medications  Medication Sig Start Date End Date Taking? Authorizing Provider  amphetamine -dextroamphetamine (ADDERALL) 20 MG tablet Take 1 tablet by mouth 3 (three) times daily. 11/18/18   [provider]  metoprolol  tartrate (LOPRESSOR ) 50 MG tablet Take one tablet by mouth twice daily day before CT Scan and one tablet 2 hours prior to CT scan Patient not taking: Reported on 08/04/2024 03/03/24   Ganji, Jay, MD  Multiple Vitamin (MULTIVITAMIN WITH MINERALS) TABS tablet Take 1 tablet by mouth daily.    [provider]  ocrelizumab  600 mg in sodium chloride  0.9 % 500 mL Inject 600 mg into the vein every 6 (six) months.    [provider]  ondansetron  (ZOFRAN ) 8 MG tablet Take 0.5 tablets (4 mg total) by mouth daily as needed for nausea or vomiting. 03/23/21   Sater, Charlie LABOR, MD  tadalafil  (CIALIS ) 20 MG tablet Take 1 tablet (20 mg total) by mouth daily as needed for erectile dysfunction. 08/04/24   Lomax, Amy, NP  zolpidem (AMBIEN) 10 MG tablet Take 10 mg by mouth at bedtime as needed for sleep.    [provider]    Allergies: Sulfa antibiotics    Review of Systems  Updated Vital Signs BP 139/84 (BP Location: Left Arm)   Pulse 92   Temp 98.1 F (36.7 C)   Resp 16   SpO2 97%   Physical Exam  (all labs ordered are listed, but only abnormal results are  displayed) Labs Reviewed  COMPREHENSIVE METABOLIC PANEL WITH GFR - Abnormal; Notable for the following components:      Result Value   AST 49 (*)    ALT 54 (*)    All other components within normal limits  CBC WITH DIFFERENTIAL/PLATELET    EKG: None  Radiology: US  Venous Img Lower Right (DVT Study) Result Date: 08/29/2024 CLINICAL DATA:  Right calf pain. EXAM: RIGHT LOWER EXTREMITY VENOUS DOPPLER ULTRASOUND TECHNIQUE: Gray-scale sonography with graded compression, as well as color Doppler and duplex ultrasound were performed to evaluate the lower extremity deep venous systems from the level of the common femoral vein and including the common femoral, femoral, profunda femoral, popliteal and calf veins including the posterior tibial, peroneal and gastrocnemius veins when visible. The superficial great saphenous vein was also interrogated. Spectral Doppler was utilized to evaluate flow at rest and with distal augmentation maneuvers in the common femoral, femoral and popliteal veins. COMPARISON:  None Available. FINDINGS: Contralateral Common Femoral Vein: Respiratory phasicity is normal and symmetric with the symptomatic side. No evidence of thrombus. Normal compressibility. Common Femoral Vein: No evidence of thrombus. Normal compressibility, respiratory phasicity and response to augmentation. Saphenofemoral Junction: No evidence of thrombus. Normal compressibility and flow on color Doppler imaging. Profunda Femoral Vein: No evidence of thrombus. Normal compressibility and flow on color Doppler imaging. Femoral Vein:  No evidence of thrombus. Normal compressibility, respiratory phasicity and response to augmentation. Popliteal Vein: There is evidence of nonocclusive thrombus with abnormal compressibility, respiratory phasicity and response to augmentation. Calf Veins: There is evidence of occlusive thrombus within the RIGHT posterior tibial vein with abnormal compressibility and flow on color Doppler  imaging. No evidence of thrombus within the RIGHT peroneal vein. Superficial Great Saphenous Vein: No evidence of thrombus. Normal compressibility. Venous Reflux:  None. Other Findings:  None. IMPRESSION: Evidence of nonocclusive thrombus within the RIGHT popliteal vein and occlusive thrombus within the RIGHT posterior tibial vein. Electronically Signed   By: Suzen Dials M.D.   On: 08/29/2024 18:04    {Document cardiac monitor, telemetry assessment procedure when appropriate:32947} Procedures   Medications Ordered in the ED - No data to display  Clinical Course as of 08/29/24 1820  Fri Aug 29, 2024  1809 Spoke with radiology Dr. Dials, patient has a right popliteal vein non-occlusive, occlusive within right posterior tibial  [CH]    Clinical Course User Index [CH] Tajay Muzzy, [place] FALCON, PA-C   {Click here for ABCD2, HEART and other calculators REFRESH Note before signing:1}                              Medical Decision Making Amount and/or Complexity of Data Reviewed Labs: ordered.   ***  {Document critical care time when appropriate  Document review of labs and clinical decision tools ie [id number], etc  Document your independent review of radiology images and any outside records  Document your discussion with family members, caretakers and with consultants  Document social determinants of health affecting pt's care  Document your decision making why or why not admission, treatments were needed:32947:::1}   Final diagnoses:  None    ED Discharge Orders     None        "

## 2024-08-29 NOTE — ED Triage Notes (Signed)
 Reports R calf strain today at the gym, hyperextended while lunging.   No obvious swelling or redness noted.   Reports concern for DVT as he has a hx of it in same leg

## 2025-02-02 ENCOUNTER — Ambulatory Visit: Admitting: Neurology
